# Patient Record
Sex: Female | Born: 1970 | Race: White | Hispanic: No | Marital: Married | Smoking: Former smoker
Health system: Southern US, Community
[De-identification: ages and names within clinical notes are randomized; demographics above are authoritative.]

## PROBLEM LIST (undated history)

## (undated) DIAGNOSIS — K219 Gastro-esophageal reflux disease without esophagitis: Secondary | ICD-10-CM

## (undated) DIAGNOSIS — J45909 Unspecified asthma, uncomplicated: Secondary | ICD-10-CM

## (undated) DIAGNOSIS — Z923 Personal history of irradiation: Secondary | ICD-10-CM

## (undated) DIAGNOSIS — H53001 Unspecified amblyopia, right eye: Secondary | ICD-10-CM

## (undated) DIAGNOSIS — C801 Malignant (primary) neoplasm, unspecified: Secondary | ICD-10-CM

## (undated) DIAGNOSIS — C50919 Malignant neoplasm of unspecified site of unspecified female breast: Secondary | ICD-10-CM

## (undated) DIAGNOSIS — N809 Endometriosis, unspecified: Secondary | ICD-10-CM

## (undated) DIAGNOSIS — D509 Iron deficiency anemia, unspecified: Secondary | ICD-10-CM

## (undated) DIAGNOSIS — G43909 Migraine, unspecified, not intractable, without status migrainosus: Secondary | ICD-10-CM

## (undated) DIAGNOSIS — D649 Anemia, unspecified: Secondary | ICD-10-CM

## (undated) DIAGNOSIS — T884XXA Failed or difficult intubation, initial encounter: Secondary | ICD-10-CM

## (undated) HISTORY — DX: Endometriosis, unspecified: N80.9

## (undated) HISTORY — DX: Malignant (primary) neoplasm, unspecified: C80.1

## (undated) HISTORY — DX: Anemia, unspecified: D64.9

## (undated) HISTORY — PX: ABLATION ON ENDOMETRIOSIS: SHX5787

## (undated) HISTORY — PX: EYE MUSCLE SURGERY: SHX370

## (undated) HISTORY — PX: OTHER SURGICAL HISTORY: SHX169

---
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
## 2005-01-22 ENCOUNTER — Emergency Department: Payer: Self-pay | Admitting: Emergency Medicine

## 2007-09-05 ENCOUNTER — Ambulatory Visit: Payer: Self-pay

## 2007-09-08 ENCOUNTER — Ambulatory Visit: Payer: Self-pay

## 2009-02-15 ENCOUNTER — Ambulatory Visit: Payer: Self-pay | Admitting: Family Medicine

## 2012-11-18 DIAGNOSIS — R102 Pelvic and perineal pain: Secondary | ICD-10-CM | POA: Insufficient documentation

## 2015-06-03 LAB — HM MAMMOGRAPHY: HM MAMMO: NEGATIVE

## 2015-07-01 LAB — HM PAP SMEAR: HM Pap smear: NORMAL

## 2015-09-03 ENCOUNTER — Encounter: Payer: Self-pay | Admitting: Nurse Practitioner

## 2015-09-03 ENCOUNTER — Ambulatory Visit (INDEPENDENT_AMBULATORY_CARE_PROVIDER_SITE_OTHER): Payer: BLUE CROSS/BLUE SHIELD | Admitting: Nurse Practitioner

## 2015-09-03 ENCOUNTER — Encounter (INDEPENDENT_AMBULATORY_CARE_PROVIDER_SITE_OTHER): Payer: Self-pay

## 2015-09-03 VITALS — BP 102/66 | HR 80 | Temp 98.4°F | Resp 14 | Ht 62.0 in | Wt 147.0 lb

## 2015-09-03 DIAGNOSIS — Z23 Encounter for immunization: Secondary | ICD-10-CM | POA: Insufficient documentation

## 2015-09-03 DIAGNOSIS — L299 Pruritus, unspecified: Secondary | ICD-10-CM | POA: Diagnosis not present

## 2015-09-03 DIAGNOSIS — E663 Overweight: Secondary | ICD-10-CM | POA: Diagnosis not present

## 2015-09-03 DIAGNOSIS — Z7689 Persons encountering health services in other specified circumstances: Secondary | ICD-10-CM

## 2015-09-03 DIAGNOSIS — Z7189 Other specified counseling: Secondary | ICD-10-CM

## 2015-09-03 MED ORDER — HYDROXYZINE HCL 10 MG PO TABS: 10.0000 mg | ORAL_TABLET | Freq: Every day | ORAL | Status: DC

## 2015-09-03 NOTE — Patient Instructions (Addendum)
Please follow up in 3 months for a physical exam and lab work (fasting is preferred).   Welcome to [facility]! Nice to meet you!

## 2015-09-03 NOTE — Assessment & Plan Note (Addendum)
Discussed acute and chronic issues. Reviewed health maintenance measures, PFSHx, and immunizations. Obtain records from previous facility.   Patient had flu shot tdap today

## 2015-09-03 NOTE — Progress Notes (Signed)
Pre visit review using our clinic review tool, if applicable. No additional management support is needed unless otherwise documented below in the visit note. 

## 2015-09-03 NOTE — Progress Notes (Signed)
Patient ID: Melinda Holder, female    DOB: 02-19-1971  Age: 44 y.o. MRN: [id number]  CC: Establish Care   HPI Melinda Holder presents for establishing care and CC of medication refill.   1) New pt info:  Immunizations- unknown tdap   Flu- would like   Mammogram- 06/2015 [facility]   Pap- 06/2015 normal per pt  Eye Exam- Not UTD, glasses for reading   Dental Exam- UTD  LMP- August 10th   2) Chronic Problems-  Overweight:   Diet- Eats out during day and cooks at night  Exercise- Walks 3 x a week for 20-30 min.   3) Acute Problems-  Hydroxyzine- Took at night for itching, prescribed by Dermatologist in past. Pt reports trying benadryl, but it was not as helpful. Seeing Dermatologist [facility]) in Oct. Chronic issue.   History Melinda Holder has a past medical history of Endometriosis.   Melinda Holder has past surgical history that includes Ablation on endometriosis and removal of fibroid.   Her family history includes Cancer in her paternal aunt and paternal aunt; Diabetes in her father; Hypertension in her father and mother; Lupus in her mother.Melinda Holder reports that Melinda Holder has never smoked. Melinda Holder has never used smokeless tobacco. Melinda Holder reports that Melinda Holder drinks alcohol. Melinda Holder reports that Melinda Holder does not use illicit drugs.  No outpatient prescriptions prior to visit.   No facility-administered medications prior to visit.    ROS Review of Systems  Constitutional: Negative for fever, chills, diaphoresis and fatigue.  Respiratory: Negative for chest tightness, shortness of breath and wheezing.   Cardiovascular: Negative for chest pain, palpitations and leg swelling.  Gastrointestinal: Negative for nausea, vomiting and diarrhea.  Skin: Negative for rash.  Neurological: Negative for dizziness, weakness, numbness and headaches.  Psychiatric/Behavioral: Negative for suicidal ideas and sleep disturbance. The patient is not nervous/anxious.     Objective:  BP 102/66 mmHg  Pulse 80  Temp(Src) 98.4  F (36.9 C)  Resp 14  Ht 5\' 2"  (1.575 m)  Wt 147 lb (66.679 kg)  BMI 26.88 kg/m2  SpO2 98%  Physical Exam  Constitutional: Melinda Holder is oriented to person, place, and time. Melinda Holder appears well-developed and well-nourished. No distress.  HENT:  Head: Normocephalic and atraumatic.  Right Ear: External ear normal.  Left Ear: External ear normal.  Small white spot on roof of mouth to the right. Seeing ENT for this  Cardiovascular: Normal rate, regular rhythm and normal heart sounds.  Exam reveals no gallop and no friction rub.   No murmur heard. Pulmonary/Chest: Effort normal and breath sounds normal. No respiratory distress. Melinda Holder has no wheezes. Melinda Holder has no rales. Melinda Holder exhibits no tenderness.  Neurological: Melinda Holder is alert and oriented to person, place, and time. No cranial nerve deficit. Melinda Holder exhibits normal muscle tone. Coordination normal.  Skin: Skin is warm and dry. No rash noted. Melinda Holder is not diaphoretic.  Psychiatric: Melinda Holder has a normal mood and affect. Her behavior is normal. Judgment and thought content normal.    Assessment & Plan:   Melinda Holder was seen today for establish care.  Diagnoses and all orders for this visit:  Overweight (BMI 25.0-29.9)  Encounter to establish care  Pruritus  Encounter for immunization  Need for Tdap vaccination -     Tdap vaccine greater than or equal to 7yo IM  Other orders -     hydrOXYzine (ATARAX/VISTARIL) 10 MG tablet; Take 1 tablet (10 mg total) by mouth at bedtime. -     Flu Vaccine QUAD 36+  mos IM   I have changed Ms. Melinda Holder's hydrOXYzine. I am also having her maintain her PROBIOTIC DAILY and Probiotic Product (ALIGN PO).  Meds ordered this encounter  Medications  . DISCONTD: hydrOXYzine (ATARAX/VISTARIL) 10 MG tablet    Sig: Take 10 mg by mouth at bedtime.  . Probiotic Product (PROBIOTIC DAILY) CAPS    Sig: Take by mouth.  . Probiotic Product (ALIGN PO)    Sig: Take by mouth.  . hydrOXYzine (ATARAX/VISTARIL) 10 MG tablet    Sig: Take 1  tablet (10 mg total) by mouth at bedtime.    Dispense:  30 tablet    Refill:  3    Order Specific Question:  Supervising Provider    Answer:  Melinda Holder [[id number]]     Follow-up: Return in about 3 months (around 12/03/2015) for CPE w/ labs.

## 2015-09-03 NOTE — Assessment & Plan Note (Signed)
Stable currently. Refilled hydroxyzine 10 mg tablets to take once at nighttime. Follow-up with dermatologist in October.

## 2015-09-03 NOTE — Assessment & Plan Note (Signed)
Patient reports she eats out during the day and cooks at home at nighttime she is not watching anything in particular at this time. Patient is exercising 3 times a week for 20-30 minutes. Asked her to up to 5 times a week for increased efficacy.

## 2016-01-07 ENCOUNTER — Other Ambulatory Visit: Payer: Self-pay | Admitting: Nurse Practitioner

## 2016-02-27 ENCOUNTER — Encounter: Payer: Self-pay | Admitting: Nurse Practitioner

## 2016-02-27 ENCOUNTER — Ambulatory Visit (INDEPENDENT_AMBULATORY_CARE_PROVIDER_SITE_OTHER): Payer: BLUE CROSS/BLUE SHIELD | Admitting: Nurse Practitioner

## 2016-02-27 VITALS — BP 106/72 | HR 60 | Temp 98.7°F | Resp 16 | Ht 62.0 in | Wt 154.6 lb

## 2016-02-27 DIAGNOSIS — Z0000 Encounter for general adult medical examination without abnormal findings: Secondary | ICD-10-CM | POA: Insufficient documentation

## 2016-02-27 DIAGNOSIS — Z0001 Encounter for general adult medical examination with abnormal findings: Secondary | ICD-10-CM | POA: Insufficient documentation

## 2016-02-27 LAB — COMPREHENSIVE METABOLIC PANEL
ALBUMIN: 4.2 g/dL (ref 3.5–5.2)
ALK PHOS: 39 U/L (ref 39–117)
ALT: 12 U/L (ref 0–35)
AST: 15 U/L (ref 0–37)
BILIRUBIN TOTAL: 0.3 mg/dL (ref 0.2–1.2)
BUN: 10 mg/dL (ref 6–23)
CALCIUM: 9.1 mg/dL (ref 8.4–10.5)
CO2: 29 meq/L (ref 19–32)
Chloride: 103 mEq/L (ref 96–112)
Creatinine, Ser: 0.58 mg/dL (ref 0.40–1.20)
GFR: 120.12 mL/min (ref >60.00)
Glucose, Bld: 82 mg/dL (ref 70–99)
Potassium: 4.9 mEq/L (ref 3.5–5.1)
Sodium: 136 mEq/L (ref 135–145)
Total Protein: 7.2 g/dL (ref 6.0–8.3)

## 2016-02-27 LAB — CBC WITH DIFFERENTIAL/PLATELET
BASOS ABS: 0 10*3/uL (ref 0.0–0.1)
BASOS PCT: 0.5 % (ref 0.0–3.0)
EOS PCT: 3.3 % (ref 0.0–5.0)
Eosinophils Absolute: 0.2 10*3/uL (ref 0.0–0.7)
HEMATOCRIT: 31.8 % — AB (ref 36.0–46.0)
Hemoglobin: 10.6 g/dL — ABNORMAL LOW (ref 12.0–15.0)
LYMPHS ABS: 2.2 10*3/uL (ref 0.7–4.0)
LYMPHS PCT: 34.5 % (ref 12.0–46.0)
MCHC: 33.3 g/dL (ref 30.0–36.0)
MCV: 89 fl (ref 78.0–100.0)
MONOS PCT: 9.1 % (ref 3.0–12.0)
Monocytes Absolute: 0.6 10*3/uL (ref 0.1–1.0)
NEUTROS ABS: 3.3 10*3/uL (ref 1.4–7.7)
NEUTROS PCT: 52.6 % (ref 43.0–77.0)
PLATELETS: 526 10*3/uL — AB (ref 150.0–400.0)
RBC: 3.58 Mil/uL — ABNORMAL LOW (ref 3.87–5.11)
RDW: 15.1 % (ref 11.5–15.5)
WBC: 6.3 10*3/uL (ref 4.0–10.5)

## 2016-02-27 LAB — LIPID PANEL
CHOLESTEROL: 201 mg/dL — AB (ref 0–200)
HDL: 42.5 mg/dL (ref >39.00)
LDL CALC: 123 mg/dL — AB (ref 0–99)
NonHDL: 158.13
TRIGLYCERIDES: 174 mg/dL — AB (ref 0.0–149.0)
Total CHOL/HDL Ratio: 5
VLDL: 34.8 mg/dL (ref 0.0–40.0)

## 2016-02-27 LAB — TSH: TSH: 1.2 u[IU]/mL (ref 0.35–4.50)

## 2016-02-27 LAB — HEMOGLOBIN A1C: HEMOGLOBIN A1C: 5.8 % (ref 4.6–6.5)

## 2016-02-27 MED ORDER — HYDROXYZINE HCL 10 MG PO TABS: ORAL_TABLET | ORAL | Status: DC

## 2016-02-27 NOTE — Patient Instructions (Signed)
Health Maintenance, Female Adopting a healthy lifestyle and getting preventive care can go a long way to promote health and wellness. Talk with your health care provider about what schedule of regular examinations is right for you. This is a good chance for you to check in with your provider about disease prevention and staying healthy. In between checkups, there are plenty of things you can do on your own. Experts have done a lot of research about which lifestyle changes and preventive measures are most likely to keep you healthy. Ask your health care provider for more information. WEIGHT AND DIET  Eat a healthy diet  Be sure to include plenty of vegetables, fruits, low-fat dairy products, and lean protein.  Do not eat a lot of foods high in solid fats, added sugars, or salt.  Get regular exercise. This is one of the most important things you can do for your health.  Most adults should exercise for at least 150 minutes each week. The exercise should increase your heart rate and make you sweat (moderate-intensity exercise).  Most adults should also do strengthening exercises at least twice a week. This is in addition to the moderate-intensity exercise.  Maintain a healthy weight  Body mass index (BMI) is a measurement that can be used to identify possible weight problems. It estimates body fat based on height and weight. Your health care provider can help determine your BMI and help you achieve or maintain a healthy weight.  For females 20 years of age and older:   A BMI below 18.5 is considered underweight.  A BMI of 18.5 to 24.9 is normal.  A BMI of 25 to 29.9 is considered overweight.  A BMI of 30 and above is considered obese.  Watch levels of cholesterol and blood lipids  You should start having your blood tested for lipids and cholesterol at 44 years of age, then have this test every 5 years.  You may need to have your cholesterol levels checked more often if:  Your lipid  or cholesterol levels are high.  You are older than 44 years of age.  You are at high risk for heart disease.  CANCER SCREENING   Lung Cancer  Lung cancer screening is recommended for adults 55-80 years old who are at high risk for lung cancer because of a history of smoking.  A yearly low-dose CT scan of the lungs is recommended for people who:  Currently smoke.  Have quit within the past 15 years.  Have at least a 30-pack-year history of smoking. A pack year is smoking an average of one pack of cigarettes a day for 1 year.  Yearly screening should continue until it has been 15 years since you quit.  Yearly screening should stop if you develop a health problem that would prevent you from having lung cancer treatment.  Breast Cancer  Practice breast self-awareness. This means understanding how your breasts normally appear and feel.  It also means doing regular breast self-exams. Let your health care provider know about any changes, no matter how small.  If you are in your 20s or 30s, you should have a clinical breast exam (CBE) by a health care provider every 1-3 years as part of a regular health exam.  If you are 40 or older, have a CBE every year. Also consider having a breast X-ray (mammogram) every year.  If you have a family history of breast cancer, talk to your health care provider about genetic screening.  If you   are at high risk for breast cancer, talk to your health care provider about having an MRI and a mammogram every year.  Breast cancer gene (BRCA) assessment is recommended for women who have family members with BRCA-related cancers. BRCA-related cancers include:  Breast.  Ovarian.  Tubal.  Peritoneal cancers.  Results of the assessment will determine the need for genetic counseling and BRCA1 and BRCA2 testing. Cervical Cancer Your health care provider may recommend that you be screened regularly for cancer of the pelvic organs (ovaries, uterus, and  vagina). This screening involves a pelvic examination, including checking for microscopic changes to the surface of your cervix (Pap test). You may be encouraged to have this screening done every 3 years, beginning at age 21.  For women ages 30-65, health care providers may recommend pelvic exams and Pap testing every 3 years, or they may recommend the Pap and pelvic exam, combined with testing for human papilloma virus (HPV), every 5 years. Some types of HPV increase your risk of cervical cancer. Testing for HPV may also be done on women of any age with unclear Pap test results.  Other health care providers may not recommend any screening for nonpregnant women who are considered low risk for pelvic cancer and who do not have symptoms. Ask your health care provider if a screening pelvic exam is right for you.  If you have had past treatment for cervical cancer or a condition that could lead to cancer, you need Pap tests and screening for cancer for at least 20 years after your treatment. If Pap tests have been discontinued, your risk factors (such as having a new sexual partner) need to be reassessed to determine if screening should resume. Some women have medical problems that increase the chance of getting cervical cancer. In these cases, your health care provider may recommend more frequent screening and Pap tests. Colorectal Cancer  This type of cancer can be detected and often prevented.  Routine colorectal cancer screening usually begins at 44 years of age and continues through 44 years of age.  Your health care provider may recommend screening at an earlier age if you have risk factors for colon cancer.  Your health care provider may also recommend using home test kits to check for hidden blood in the stool.  A small camera at the end of a tube can be used to examine your colon directly (sigmoidoscopy or colonoscopy). This is done to check for the earliest forms of colorectal  cancer.  Routine screening usually begins at age 50.  Direct examination of the colon should be repeated every 5-10 years through 44 years of age. However, you may need to be screened more often if early forms of precancerous polyps or small growths are found. Skin Cancer  Check your skin from head to toe regularly.  Tell your health care provider about any new moles or changes in moles, especially if there is a change in a mole's shape or color.  Also tell your health care provider if you have a mole that is larger than the size of a pencil eraser.  Always use sunscreen. Apply sunscreen liberally and repeatedly throughout the day.  Protect yourself by wearing long sleeves, pants, a wide-brimmed hat, and sunglasses whenever you are outside. HEART DISEASE, DIABETES, AND HIGH BLOOD PRESSURE   High blood pressure causes heart disease and increases the risk of stroke. High blood pressure is more likely to develop in:  People who have blood pressure in the high end   of the normal range (130-139/85-89 mm Hg).  People who are overweight or obese.  People who are African American.  If you are 38-23 years of age, have your blood pressure checked every 3-5 years. If you are 61 years of age or older, have your blood pressure checked every year. You should have your blood pressure measured twice--once when you are at a hospital or clinic, and once when you are not at a hospital or clinic. Record the average of the two measurements. To check your blood pressure when you are not at a hospital or clinic, you can use:  An automated blood pressure machine at a pharmacy.  A home blood pressure monitor.  If you are between 45 years and 39 years old, ask your health care provider if you should take aspirin to prevent strokes.  Have regular diabetes screenings. This involves taking a blood sample to check your fasting blood sugar level.  If you are at a normal weight and have a low risk for diabetes,  have this test once every three years after 44 years of age.  If you are overweight and have a high risk for diabetes, consider being tested at a younger age or more often. PREVENTING INFECTION  Hepatitis B  If you have a higher risk for hepatitis B, you should be screened for this virus. You are considered at high risk for hepatitis B if:  You were born in a country where hepatitis B is common. Ask your health care provider which countries are considered high risk.  Your parents were born in a high-risk country, and you have not been immunized against hepatitis B (hepatitis B vaccine).  You have HIV or AIDS.  You use needles to inject street drugs.  You live with someone who has hepatitis B.  You have had sex with someone who has hepatitis B.  You get hemodialysis treatment.  You take certain medicines for conditions, including cancer, organ transplantation, and autoimmune conditions. Hepatitis C  Blood testing is recommended for:  Everyone born from [birth year] through 1964.  Anyone with known risk factors for hepatitis C. Sexually transmitted infections (STIs)  You should be screened for sexually transmitted infections (STIs) including gonorrhea and chlamydia if:  You are sexually active and are younger than 44 years of age.  You are older than 44 years of age and your health care provider tells you that you are at risk for this type of infection.  Your sexual activity has changed since you were last screened and you are at an increased risk for chlamydia or gonorrhea. Ask your health care provider if you are at risk.  If you do not have HIV, but are at risk, it may be recommended that you take a prescription medicine daily to prevent HIV infection. This is called pre-exposure prophylaxis (PrEP). You are considered at risk if:  You are sexually active and do not regularly use condoms or know the HIV status of your partner(s).  You take drugs by injection.  You are sexually  active with a partner who has HIV. Talk with your health care provider about whether you are at high risk of being infected with HIV. If you choose to begin PrEP, you should first be tested for HIV. You should then be tested every 3 months for as long as you are taking PrEP.  PREGNANCY   If you are premenopausal and you may become pregnant, ask your health care provider about preconception counseling.  If you may  become pregnant, take 400 to 800 micrograms (mcg) of folic acid every day.  If you want to prevent pregnancy, talk to your health care provider about birth control (contraception). OSTEOPOROSIS AND MENOPAUSE   Osteoporosis is a disease in which the bones lose minerals and strength with aging. This can result in serious bone fractures. Your risk for osteoporosis can be identified using a bone density scan.  If you are 61 years of age or older, or if you are at risk for osteoporosis and fractures, ask your health care provider if you should be screened.  Ask your health care provider whether you should take a calcium or vitamin D supplement to lower your risk for osteoporosis.  Menopause may have certain physical symptoms and risks.  Hormone replacement therapy may reduce some of these symptoms and risks. Talk to your health care provider about whether hormone replacement therapy is right for you.  HOME CARE INSTRUCTIONS   Schedule regular health, dental, and eye exams.  Stay current with your immunizations.   Do not use any tobacco products including cigarettes, chewing tobacco, or electronic cigarettes.  If you are pregnant, do not drink alcohol.  If you are breastfeeding, limit how much and how often you drink alcohol.  Limit alcohol intake to no more than 1 drink per day for nonpregnant women. One drink equals 12 ounces of beer, 5 ounces of wine, or 1 ounces of hard liquor.  Do not use street drugs.  Do not share needles.  Ask your health care provider for help if  you need support or information about quitting drugs.  Tell your health care provider if you often feel depressed.  Tell your health care provider if you have ever been abused or do not feel safe at home.   This information is not intended to replace advice given to you by your health care provider. Make sure you discuss any questions you have with your health care provider.   Document Released: 06/29/2011 Document Revised: 01/04/2015 Document Reviewed: 11/15/2013 Elsevier Interactive Patient Education [employer].

## 2016-02-27 NOTE — Assessment & Plan Note (Signed)
Discussed acute and chronic issues. Reviewed health maintenance measures, PFSHx, and immunizations. Obtain routine labs TSH, Lipid panel, CBC w/ diff, A1c, and CMET.   Health Maintenance is UTD

## 2016-02-27 NOTE — Progress Notes (Signed)
Patient ID: Melinda Holder, female    DOB: December 16, 1971  Age: 45 y.o. MRN: [id number]  CC: Annual Exam   HPI Melinda Holder presents for Annual physical exam.   1) Annual Physical   Diet- No changes   Exercise- Walking 3 x a week 20-30 minutes  Immunizations-    Flu- UTD   Tdap- UTD  Mammogram-06/2015 at [facility] Exam- Not UTD  Dental Exam- UTD  LMP-  02/22/16 lasted 6 days   PAP- last year at [place].  Refills: Needs  History Melinda Holder has a past medical history of Endometriosis.   She has past surgical history that includes Ablation on endometriosis and removal of fibroid.   Her family history includes Cancer in her paternal aunt and paternal aunt; Diabetes in her father; Hypertension in her father and mother; Lupus in her mother.She reports that she has never smoked. She has never used smokeless tobacco. She reports that she drinks alcohol. She reports that she does not use illicit drugs.  Outpatient Prescriptions Prior to Visit  Medication Sig Dispense Refill  . Probiotic Product (ALIGN PO) Take by mouth.    . Probiotic Product (PROBIOTIC DAILY) CAPS Take by mouth.    . hydrOXYzine (ATARAX/VISTARIL) 10 MG tablet TAKE 1 TABLET (10 MG TOTAL) BY MOUTH AT BEDTIME. 30 tablet 3   No facility-administered medications prior to visit.    ROS Review of Systems  Constitutional: Negative for fever, chills, diaphoresis, fatigue and unexpected weight change.  HENT: Negative for tinnitus and trouble swallowing.   Eyes: Negative for visual disturbance.  Respiratory: Negative for chest tightness, shortness of breath and wheezing.   Cardiovascular: Negative for chest pain, palpitations and leg swelling.  Gastrointestinal: Negative for nausea, vomiting, abdominal pain, diarrhea, constipation and blood in stool.  Endocrine: Positive for polydipsia and polyuria. Negative for polyphagia.  Genitourinary: Negative for dysuria, hematuria, vaginal  discharge and vaginal pain.  Musculoskeletal: Negative for myalgias, back pain, arthralgias and gait problem.  Skin: Negative for color change and rash.  Neurological: Negative for dizziness, weakness, numbness and headaches.  Hematological: Does not bruise/bleed easily.  Psychiatric/Behavioral: Negative for suicidal ideas and sleep disturbance. The patient is not nervous/anxious.     Objective:  BP 106/72 mmHg  Pulse 60  Temp(Src) 98.7 F (37.1 C) (Oral)  Resp 16  Ht 5\' 2"  (1.575 m)  Wt 154 lb 9.6 oz (70.126 kg)  BMI 28.27 kg/m2  SpO2 98%  LMP 02/22/2016  Physical Exam  Constitutional: She is oriented to person, place, and time. She appears well-developed and well-nourished. No distress.  HENT:  Head: Normocephalic and atraumatic.  Right Ear: External ear normal.  Left Ear: External ear normal.  Nose: Nose normal.  Mouth/Throat: Oropharynx is clear and moist. No oropharyngeal exudate.  TMs and canals clear bilaterally  Eyes: Conjunctivae and EOM are normal. Pupils are equal, round, and reactive to light. Right eye exhibits no discharge. Left eye exhibits no discharge. No scleral icterus.  Neck: Normal range of motion. Neck supple. No thyromegaly present.  Cardiovascular: Normal rate, regular rhythm, normal heart sounds and intact distal pulses.  Exam reveals no gallop and no friction rub.   No murmur heard. Pulmonary/Chest: Effort normal and breath sounds normal. No respiratory distress. She has no wheezes. She has no rales. She exhibits no tenderness.  Breast exam performed without significant findings  Abdominal: Soft. Bowel sounds are normal. She exhibits no distension and no mass. There is  no tenderness. There is no rebound and no guarding.  Genitourinary:  Deferred to [place]  Musculoskeletal: Normal range of motion. She exhibits no edema or tenderness.  Lymphadenopathy:    She has no cervical adenopathy.  Neurological: She is alert and oriented to person, place, and  time. She has normal reflexes. No cranial nerve deficit. She exhibits normal muscle tone. Coordination normal.  Skin: Skin is warm and dry. No rash noted. She is not diaphoretic. No erythema. No pallor.  Psychiatric: She has a normal mood and affect. Her behavior is normal. Judgment and thought content normal.   Assessment & Plan:   Melinda Holder was seen today for annual exam.  Diagnoses and all orders for this visit:  Routine general medical examination at a health care facility -     CBC with Differential/Platelet -     Comprehensive metabolic panel -     Hemoglobin A1c -     Lipid panel -     TSH  Other orders -     hydrOXYzine (ATARAX/VISTARIL) 10 MG tablet; TAKE 1 TABLET (10 MG TOTAL) BY MOUTH AT BEDTIME.   I am having Melinda Holder maintain her PROBIOTIC DAILY, Probiotic Product (ALIGN PO), and hydrOXYzine.  Meds ordered this encounter  Medications  . hydrOXYzine (ATARAX/VISTARIL) 10 MG tablet    Sig: TAKE 1 TABLET (10 MG TOTAL) BY MOUTH AT BEDTIME.    Dispense:  30 tablet    Refill:  3    Order Specific Question:  Supervising Provider    Answer:  Crecencio Mc [[id number]]     Follow-up: Return in about 1 year (around 02/26/2017) for CPE w/ labs.

## 2016-03-04 ENCOUNTER — Other Ambulatory Visit: Payer: Self-pay | Admitting: Nurse Practitioner

## 2016-03-04 ENCOUNTER — Telehealth: Payer: Self-pay

## 2016-03-04 DIAGNOSIS — R7989 Other specified abnormal findings of blood chemistry: Secondary | ICD-10-CM

## 2016-03-04 NOTE — Telephone Encounter (Signed)
Message regarding her labs sent to you. Please call pt and discuss. Send me a message back when complete. Thanks!

## 2016-03-04 NOTE — Telephone Encounter (Signed)
Pt is requesting test results, available on mychart but I don't see a message. Please advise, thanks

## 2016-03-17 ENCOUNTER — Other Ambulatory Visit (INDEPENDENT_AMBULATORY_CARE_PROVIDER_SITE_OTHER): Payer: BLUE CROSS/BLUE SHIELD

## 2016-03-17 DIAGNOSIS — D473 Essential (hemorrhagic) thrombocythemia: Secondary | ICD-10-CM | POA: Diagnosis not present

## 2016-03-17 DIAGNOSIS — R7989 Other specified abnormal findings of blood chemistry: Secondary | ICD-10-CM

## 2016-03-17 LAB — CBC WITH DIFFERENTIAL/PLATELET
BASOS ABS: 0 10*3/uL (ref 0.0–0.1)
BASOS PCT: 0.7 % (ref 0.0–3.0)
Eosinophils Absolute: 0.2 10*3/uL (ref 0.0–0.7)
Eosinophils Relative: 2.6 % (ref 0.0–5.0)
HEMATOCRIT: 32.6 % — AB (ref 36.0–46.0)
Hemoglobin: 10.8 g/dL — ABNORMAL LOW (ref 12.0–15.0)
LYMPHS ABS: 1.8 10*3/uL (ref 0.7–4.0)
LYMPHS PCT: 29.9 % (ref 12.0–46.0)
MCHC: 33 g/dL (ref 30.0–36.0)
MCV: 87.1 fl (ref 78.0–100.0)
MONOS PCT: 10 % (ref 3.0–12.0)
Monocytes Absolute: 0.6 10*3/uL (ref 0.1–1.0)
NEUTROS ABS: 3.4 10*3/uL (ref 1.4–7.7)
NEUTROS PCT: 56.8 % (ref 43.0–77.0)
PLATELETS: 529 10*3/uL — AB (ref 150.0–400.0)
RBC: 3.74 Mil/uL — ABNORMAL LOW (ref 3.87–5.11)
RDW: 14.6 % (ref 11.5–15.5)
WBC: 5.9 10*3/uL (ref 4.0–10.5)

## 2016-03-23 ENCOUNTER — Other Ambulatory Visit: Payer: Self-pay | Admitting: Nurse Practitioner

## 2016-03-23 ENCOUNTER — Telehealth: Payer: Self-pay

## 2016-03-23 DIAGNOSIS — D649 Anemia, unspecified: Secondary | ICD-10-CM

## 2016-03-23 DIAGNOSIS — R5382 Chronic fatigue, unspecified: Secondary | ICD-10-CM

## 2016-03-23 NOTE — Telephone Encounter (Signed)
Pt states that since starting the iron supplement she has been consipated. Pt states that she has not moved her bowels since Thursday afternoon. Pt feels nausea and bloated. I asked the pt if she is eating anything before taking iron supplements and she states that she eats a meal before taking iron. Pt has not tried anything OTC for constipation. Pt  Says she feels tried and has lack of energy. Please advise, thanks

## 2016-03-23 NOTE — Telephone Encounter (Signed)
Great, order is in. Thanks for doing that!

## 2016-03-23 NOTE — Telephone Encounter (Signed)
Pt states that wants to move forward with the blood draw. Pt has been scheduled for the lab Thursday at 11am Please advise, thanks

## 2016-03-23 NOTE — Telephone Encounter (Signed)
The tired and lack of energy can be from anemia. She can add metamucil daily over the counter to help her move her bowels or I can call in some miralax to help her.

## 2016-03-23 NOTE — Telephone Encounter (Signed)
Pt wants to know if she can get B12 injections for the anemia and she stated that she will buy the OTC Miralax. Please advise, thanks

## 2016-03-23 NOTE — Telephone Encounter (Signed)
That (B12) is a different test that would be another blood draw.

## 2016-03-26 ENCOUNTER — Other Ambulatory Visit (INDEPENDENT_AMBULATORY_CARE_PROVIDER_SITE_OTHER): Payer: BLUE CROSS/BLUE SHIELD

## 2016-03-26 DIAGNOSIS — R5382 Chronic fatigue, unspecified: Secondary | ICD-10-CM

## 2016-03-26 LAB — VITAMIN B12: VITAMIN B 12: 598 pg/mL (ref 211–911)

## 2016-09-20 ENCOUNTER — Other Ambulatory Visit: Payer: Self-pay | Admitting: Nurse Practitioner

## 2016-10-27 ENCOUNTER — Encounter: Payer: Self-pay | Admitting: Emergency Medicine

## 2016-10-27 ENCOUNTER — Emergency Department: Payer: BLUE CROSS/BLUE SHIELD

## 2016-10-27 ENCOUNTER — Emergency Department
Admission: EM | Admit: 2016-10-27 | Discharge: 2016-10-27 | Disposition: A | Discharge status: Home / Self Care | Payer: BLUE CROSS/BLUE SHIELD | Attending: Emergency Medicine | Admitting: Emergency Medicine

## 2016-10-27 DIAGNOSIS — Y999 Unspecified external cause status: Secondary | ICD-10-CM | POA: Insufficient documentation

## 2016-10-27 DIAGNOSIS — S42002A Fracture of unspecified part of left clavicle, initial encounter for closed fracture: Secondary | ICD-10-CM | POA: Diagnosis not present

## 2016-10-27 DIAGNOSIS — Y92410 Unspecified street and highway as the place of occurrence of the external cause: Secondary | ICD-10-CM | POA: Insufficient documentation

## 2016-10-27 DIAGNOSIS — S42022A Displaced fracture of shaft of left clavicle, initial encounter for closed fracture: Secondary | ICD-10-CM | POA: Diagnosis not present

## 2016-10-27 DIAGNOSIS — Y9389 Activity, other specified: Secondary | ICD-10-CM | POA: Diagnosis not present

## 2016-10-27 DIAGNOSIS — S4992XA Unspecified injury of left shoulder and upper arm, initial encounter: Secondary | ICD-10-CM | POA: Diagnosis present

## 2016-10-27 DIAGNOSIS — S299XXA Unspecified injury of thorax, initial encounter: Secondary | ICD-10-CM | POA: Diagnosis not present

## 2016-10-27 MED ORDER — ACETAMINOPHEN 500 MG PO TABS
1000.0000 mg | ORAL_TABLET | Freq: Once | ORAL | Status: AC
  Administered 2016-10-27: 1000 mg via ORAL
  Filled 2016-10-27: qty 2

## 2016-10-27 MED ORDER — OXYCODONE-ACETAMINOPHEN 5-325 MG PO TABS: 1.0000 | ORAL_TABLET | ORAL | 0 refills | Status: DC | PRN

## 2016-10-27 MED ORDER — OXYCODONE HCL 5 MG PO TABS
5.0000 mg | ORAL_TABLET | Freq: Once | ORAL | Status: AC
  Administered 2016-10-27: 5 mg via ORAL
  Filled 2016-10-27: qty 1
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
## 2016-10-27 NOTE — ED Notes (Signed)
Patient transported to X-ray 
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
## 2016-10-28 HISTORY — PX: CLAVICLE SURGERY: SHX598

## 2016-10-30 DIAGNOSIS — S42022A Displaced fracture of shaft of left clavicle, initial encounter for closed fracture: Secondary | ICD-10-CM | POA: Diagnosis not present

## 2016-11-03 ENCOUNTER — Encounter
Admission: RE | Disposition: A | Discharge status: Home / Self Care | Payer: Self-pay | Source: Ambulatory Visit | Attending: Surgery

## 2016-11-03 ENCOUNTER — Ambulatory Visit: Payer: BLUE CROSS/BLUE SHIELD | Admitting: Anesthesiology

## 2016-11-03 ENCOUNTER — Encounter: Payer: Self-pay | Admitting: *Deleted

## 2016-11-03 ENCOUNTER — Inpatient Hospital Stay: Admission: RE | Admit: 2016-11-03 | Payer: Self-pay | Source: Ambulatory Visit

## 2016-11-03 ENCOUNTER — Ambulatory Visit
Admission: RE | Admit: 2016-11-03 | Discharge: 2016-11-03 | Disposition: A | Discharge status: Home / Self Care | Payer: BLUE CROSS/BLUE SHIELD | Source: Ambulatory Visit | Attending: Surgery | Admitting: Surgery

## 2016-11-03 ENCOUNTER — Ambulatory Visit: Payer: BLUE CROSS/BLUE SHIELD

## 2016-11-03 DIAGNOSIS — Y9389 Activity, other specified: Secondary | ICD-10-CM | POA: Insufficient documentation

## 2016-11-03 DIAGNOSIS — S42002A Fracture of unspecified part of left clavicle, initial encounter for closed fracture: Secondary | ICD-10-CM | POA: Diagnosis not present

## 2016-11-03 DIAGNOSIS — Y998 Other external cause status: Secondary | ICD-10-CM | POA: Diagnosis not present

## 2016-11-03 DIAGNOSIS — S42022A Displaced fracture of shaft of left clavicle, initial encounter for closed fracture: Secondary | ICD-10-CM | POA: Diagnosis not present

## 2016-11-03 DIAGNOSIS — Y9289 Other specified places as the place of occurrence of the external cause: Secondary | ICD-10-CM | POA: Insufficient documentation

## 2016-11-03 DIAGNOSIS — F17210 Nicotine dependence, cigarettes, uncomplicated: Secondary | ICD-10-CM | POA: Insufficient documentation

## 2016-11-03 DIAGNOSIS — T884XXA Failed or difficult intubation, initial encounter: Secondary | ICD-10-CM

## 2016-11-03 DIAGNOSIS — S42102A Fracture of unspecified part of scapula, left shoulder, initial encounter for closed fracture: Secondary | ICD-10-CM | POA: Diagnosis not present

## 2016-11-03 DIAGNOSIS — Z419 Encounter for procedure for purposes other than remedying health state, unspecified: Secondary | ICD-10-CM

## 2016-11-03 HISTORY — DX: Failed or difficult intubation, initial encounter: T88.4XXA

## 2016-11-03 HISTORY — PX: ORIF CLAVICULAR FRACTURE: SHX5055

## 2016-11-03 HISTORY — DX: Unspecified amblyopia, right eye: H53.001

## 2016-11-03 LAB — POCT PREGNANCY, URINE: Preg Test, Ur: NEGATIVE

## 2016-11-03 SURGERY — OPEN REDUCTION INTERNAL FIXATION (ORIF) CLAVICULAR FRACTURE
Anesthesia: General | Site: Shoulder | Laterality: Left | Wound class: Clean

## 2016-11-03 MED ORDER — BUPIVACAINE-EPINEPHRINE (PF) 0.25% -1:200000 IJ SOLN
INTRAMUSCULAR | Status: AC
  Filled 2016-11-03: qty 30

## 2016-11-03 MED ORDER — CEFAZOLIN SODIUM-DEXTROSE 2-4 GM/100ML-% IV SOLN: 2.0000 g | Freq: Once | INTRAVENOUS | Status: DC

## 2016-11-03 MED ORDER — CEFAZOLIN SODIUM-DEXTROSE 2-4 GM/100ML-% IV SOLN
INTRAVENOUS | Status: AC
  Filled 2016-11-03: qty 100

## 2016-11-03 MED ORDER — RACEPINEPHRINE HCL 2.25 % IN NEBU
INHALATION_SOLUTION | RESPIRATORY_TRACT | Status: AC
  Administered 2016-11-03: 0.5 mL via RESPIRATORY_TRACT
  Filled 2016-11-03: qty 0.5

## 2016-11-03 MED ORDER — METHYLPREDNISOLONE SODIUM SUCC 125 MG IJ SOLR
INTRAMUSCULAR | Status: DC | PRN
  Administered 2016-11-03: 125 mg via INTRAVENOUS

## 2016-11-03 MED ORDER — DEXAMETHASONE SODIUM PHOSPHATE 10 MG/ML IJ SOLN
INTRAMUSCULAR | Status: DC | PRN
  Administered 2016-11-03: 10 mg via INTRAVENOUS

## 2016-11-03 MED ORDER — EPHEDRINE SULFATE 50 MG/ML IJ SOLN
INTRAMUSCULAR | Status: DC | PRN
  Administered 2016-11-03 (×3): 10 mg via INTRAVENOUS

## 2016-11-03 MED ORDER — RACEPINEPHRINE HCL 2.25 % IN NEBU
0.5000 mL | INHALATION_SOLUTION | Freq: Once | RESPIRATORY_TRACT | Status: AC
  Administered 2016-11-03: 0.5 mL via RESPIRATORY_TRACT

## 2016-11-03 MED ORDER — NEOMYCIN-POLYMYXIN B GU 40-200000 IR SOLN
Status: DC | PRN
  Administered 2016-11-03: 2 mL

## 2016-11-03 MED ORDER — LACTATED RINGERS IV SOLN
INTRAVENOUS | Status: DC
  Administered 2016-11-03 (×2): via INTRAVENOUS

## 2016-11-03 MED ORDER — FENTANYL CITRATE (PF) 100 MCG/2ML IJ SOLN
25.0000 ug | INTRAMUSCULAR | Status: DC | PRN
  Administered 2016-11-03 (×4): 25 ug via INTRAVENOUS

## 2016-11-03 MED ORDER — FENTANYL CITRATE (PF) 100 MCG/2ML IJ SOLN
INTRAMUSCULAR | Status: DC | PRN
  Administered 2016-11-03: 50 ug via INTRAVENOUS
  Administered 2016-11-03: 100 ug via INTRAVENOUS
  Administered 2016-11-03: 50 ug via INTRAVENOUS
  Administered 2016-11-03: 25 ug via INTRAVENOUS
  Administered 2016-11-03: 50 ug via INTRAVENOUS
  Administered 2016-11-03: 25 ug via INTRAVENOUS

## 2016-11-03 MED ORDER — BUPIVACAINE-EPINEPHRINE (PF) 0.5% -1:200000 IJ SOLN
INTRAMUSCULAR | Status: DC | PRN
  Administered 2016-11-03: 30 mL via PERINEURAL

## 2016-11-03 MED ORDER — ONDANSETRON HCL 4 MG/2ML IJ SOLN: 4.0000 mg | Freq: Once | INTRAMUSCULAR | Status: DC | PRN

## 2016-11-03 MED ORDER — SUCCINYLCHOLINE CHLORIDE 20 MG/ML IJ SOLN
INTRAMUSCULAR | Status: DC | PRN
  Administered 2016-11-03 (×2): 100 mg via INTRAVENOUS

## 2016-11-03 MED ORDER — OXYCODONE HCL 5 MG PO TABS: 5.0000 mg | ORAL_TABLET | ORAL | 0 refills | Status: DC | PRN

## 2016-11-03 MED ORDER — ONDANSETRON HCL 4 MG/2ML IJ SOLN
INTRAMUSCULAR | Status: DC | PRN
  Administered 2016-11-03: 4 mg via INTRAVENOUS

## 2016-11-03 MED ORDER — KETOROLAC TROMETHAMINE 30 MG/ML IJ SOLN
INTRAMUSCULAR | Status: DC | PRN
  Administered 2016-11-03: 30 mg via INTRAVENOUS

## 2016-11-03 MED ORDER — PROPOFOL 10 MG/ML IV BOLUS
INTRAVENOUS | Status: DC | PRN
  Administered 2016-11-03: 150 mg via INTRAVENOUS
  Administered 2016-11-03 (×2): 50 mg via INTRAVENOUS

## 2016-11-03 MED ORDER — LIDOCAINE HCL (CARDIAC) 20 MG/ML IV SOLN
INTRAVENOUS | Status: DC | PRN
  Administered 2016-11-03: 60 mg via INTRAVENOUS

## 2016-11-03 MED ORDER — ROCURONIUM BROMIDE 100 MG/10ML IV SOLN
INTRAVENOUS | Status: DC | PRN
  Administered 2016-11-03: 10 mg via INTRAVENOUS
  Administered 2016-11-03: 5 mg via INTRAVENOUS

## 2016-11-03 MED ORDER — SUGAMMADEX SODIUM 200 MG/2ML IV SOLN
INTRAVENOUS | Status: DC | PRN
  Administered 2016-11-03: 140 mg via INTRAVENOUS

## 2016-11-03 MED ORDER — NEOMYCIN-POLYMYXIN B GU 40-200000 IR SOLN
Status: AC
Start: 2016-11-03 — End: 2016-11-03
  Filled 2016-11-03: qty 2

## 2016-11-03 MED ORDER — PHENYLEPHRINE HCL 10 MG/ML IJ SOLN
INTRAMUSCULAR | Status: DC | PRN
  Administered 2016-11-03 (×2): 50 ug via INTRAVENOUS
  Administered 2016-11-03: 100 ug via INTRAVENOUS

## 2016-11-03 MED ORDER — MIDAZOLAM HCL 5 MG/5ML IJ SOLN
INTRAMUSCULAR | Status: DC | PRN
  Administered 2016-11-03: 2 mg via INTRAVENOUS

## 2016-11-03 MED ORDER — FENTANYL CITRATE (PF) 100 MCG/2ML IJ SOLN
INTRAMUSCULAR | Status: AC
  Administered 2016-11-03: 25 ug via INTRAVENOUS
  Filled 2016-11-03: qty 2

## 2016-11-03 MED ORDER — FAMOTIDINE 20 MG PO TABS
20.0000 mg | ORAL_TABLET | Freq: Once | ORAL | Status: AC
  Administered 2016-11-03: 20 mg via ORAL

## 2016-11-03 MED ORDER — FAMOTIDINE 20 MG PO TABS
ORAL_TABLET | ORAL | Status: AC
  Filled 2016-11-03: qty 1

## 2016-11-03 SURGICAL SUPPLY — 43 items
BINDER ABDOMINAL 12 ML 46-62 (SOFTGOODS) IMPLANT
BIT DRILL 2.8 QUICK RELEASE (BIT) ×1 IMPLANT
BNDG COHESIVE 4X5 TAN STRL (GAUZE/BANDAGES/DRESSINGS) ×3 IMPLANT
CANISTER SUCT 1200ML W/VALVE (MISCELLANEOUS) ×3 IMPLANT
CHLORAPREP W/TINT 26ML (MISCELLANEOUS) ×3 IMPLANT
CLOSURE WOUND 1/2 X4 (GAUZE/BANDAGES/DRESSINGS)
DRAPE C-ARM XRAY 36X54 (DRAPES) ×3 IMPLANT
DRAPE IMP U-DRAPE 54X76 (DRAPES) ×6 IMPLANT
DRAPE INCISE IOBAN 66X45 STRL (DRAPES) ×6 IMPLANT
DRILL 2.8 QUICK RELEASE (BIT) ×3
DRSG OPSITE POSTOP 4X6 (GAUZE/BANDAGES/DRESSINGS) IMPLANT
GAUZE SPONGE 4X4 12PLY STRL (GAUZE/BANDAGES/DRESSINGS) ×3 IMPLANT
GLOVE BIO SURGEON STRL SZ7.5 (GLOVE) ×6 IMPLANT
GLOVE BIO SURGEON STRL SZ8 (GLOVE) ×6 IMPLANT
GLOVE BIOGEL PI IND STRL 8 (GLOVE) ×2 IMPLANT
GLOVE BIOGEL PI INDICATOR 8 (GLOVE) ×4
GLOVE INDICATOR 8.0 STRL GRN (GLOVE) ×3 IMPLANT
GLOVE SURG XRAY 8.0 LX (GLOVE) ×3 IMPLANT
GOWN STRL REUS W/ TWL LRG LVL3 (GOWN DISPOSABLE) ×1 IMPLANT
GOWN STRL REUS W/ TWL XL LVL3 (GOWN DISPOSABLE) ×1 IMPLANT
GOWN STRL REUS W/TWL LRG LVL3 (GOWN DISPOSABLE) ×2
GOWN STRL REUS W/TWL XL LVL3 (GOWN DISPOSABLE) ×2
IMMOBILIZER SHDR LG LX 900803 (SOFTGOODS) IMPLANT
IMMOBILIZER SHDR MD LX WHT (SOFTGOODS) ×3 IMPLANT
KIT RM TURNOVER STRD PROC AR (KITS) ×3 IMPLANT
KIT STABILIZATION SHOULDER (MISCELLANEOUS) ×3 IMPLANT
MASK FACE SPIDER DISP (MASK) IMPLANT
NEEDLE FILTER BLUNT 18X 1/2SAF (NEEDLE) ×2
NEEDLE FILTER BLUNT 18X1 1/2 (NEEDLE) ×1 IMPLANT
NS IRRIG 500ML POUR BTL (IV SOLUTION) ×3 IMPLANT
PACK ARTHROSCOPY SHOULDER (MISCELLANEOUS) ×3 IMPLANT
PLATE CLAV LOCK 6H SML (Plate) ×3 IMPLANT
SCREW CANCEL 4.0X12 (Screw) ×3 IMPLANT
SCREW NON LOCK 3.5X10MM (Screw) ×9 IMPLANT
SCREW NON LOCK 3.5X8MM (Screw) ×3 IMPLANT
SCREW NONLOCK HEX 3.5X12 (Screw) ×6 IMPLANT
STAPLER SKIN PROX 35W (STAPLE) ×3 IMPLANT
STRIP CLOSURE SKIN 1/2X4 (GAUZE/BANDAGES/DRESSINGS) IMPLANT
SUT VIC AB 0 SH 27 (SUTURE) ×3 IMPLANT
SUT VIC AB 2-0 CT1 27 (SUTURE) ×4
SUT VIC AB 2-0 CT1 TAPERPNT 27 (SUTURE) ×2 IMPLANT
SUT VIC AB 2-0 CT2 27 (SUTURE) ×6 IMPLANT
SYRINGE 10CC LL (SYRINGE) ×3 IMPLANT

## 2016-11-03 NOTE — Discharge Instructions (Addendum)
May shower with intact OpSite dressing.  Apply ice frequently to shoulder. Take ibuprofen 800 mg TID with meals for 7-10 days, then as necessary. Take oxycodone as prescribed when needed.  May supplement with ES Tylenol if necessary. Keep shoulder immobilizer on at all times except may remove for bathing purposes. Follow-up in 10-14 days or as scheduled.   AMBULATORY SURGERY  DISCHARGE INSTRUCTIONS   1) The drugs that you were given will stay in your system until tomorrow so for the next 24 hours you should not:  A) Drive an automobile B) Make any legal decisions C) Drink any alcoholic beverage   2) You may resume regular meals tomorrow.  Today it is better to start with liquids and gradually work up to solid foods.  You may eat anything you prefer, but it is better to start with liquids, then soup and crackers, and gradually work up to solid foods.   3) Please notify your doctor immediately if you have any unusual bleeding, trouble breathing, redness and pain at the surgery site, drainage, fever, or pain not relieved by medication.    4) Additional Instructions:        Please contact your physician with any problems or Same Day Surgery at [phone], Monday through Friday 6 am to 4 pm, or [place] at [facility] number at [phone].

## 2016-11-03 NOTE — H&P (Signed)
Paper H&P to be scanned into permanent record. H&P reviewed. No changes. 

## 2016-11-03 NOTE — Anesthesia Procedure Notes (Signed)
Procedure Name: Intubation Date/Time: 11/03/2016 3:48 PM Performed by: Dionne Bucy Pre-anesthesia Checklist: Patient identified, Patient being monitored, Timeout performed, Emergency Drugs available and Suction available Patient Re-evaluated:Patient Re-evaluated prior to inductionOxygen Delivery Method: Circle system utilized Preoxygenation: Pre-oxygenation with 100% oxygen Intubation Type: IV induction Ventilation: Mask ventilation without difficulty Laryngoscope Size: Glidescope and 3 (Lo pro S3) Grade View: Grade III Tube type: Oral Tube size: 7.0 mm Number of attempts: 4 (see note below) Airway Equipment and Method: Stylet Placement Confirmation: ETT inserted through vocal cords under direct vision,  positive ETCO2 and breath sounds checked- equal and bilateral Secured at: 21 cm Tube secured with: Tape Dental Injury: Teeth and Oropharynx as per pre-operative assessment  Difficulty Due To: Difficulty was anticipated Future Recommendations: Recommend- induction with short-acting agent, and alternative techniques readily available Comments: DL x1 with MAC 3 per CRNA with grade II-III view; unable to pass ETT due to pt being anterior; DL X 2 with McGrath #3 per CRNA; unable to pass ETT due to anterior Grade II-III view; DL x3 with Glidescope #3 Lo pro per Dr Andree Elk with same view as above; unable to pass ETT utilizing Glidescope stylet due to pt being anterior; DL x4 with Glidescope #3 Lo pro; bougie utilized and pt was successfully intubated with ETT 7 with + ETCO2 and Bilat equal breath sounds.  Pt VS stable throughout and easy to mask ventilate.

## 2016-11-03 NOTE — Transfer of Care (Signed)
Immediate Anesthesia Transfer of Care Note  Patient: Melinda Holder  Procedure(s) Performed: Procedure(s): OPEN REDUCTION INTERNAL FIXATION (ORIF) CLAVICULAR FRACTURE (Left)  Patient Location: PACU  Anesthesia Type:General  Level of Consciousness: awake, alert  and oriented  Airway & Oxygen Therapy: Patient Spontanous Breathing and Patient connected to face mask oxygen  Post-op Assessment: Report given to RN and Post -op Vital signs reviewed and stable  Post vital signs: Reviewed and stable  Last Vitals:  Vitals:   11/03/16 1355 11/03/16 1744  BP: 112/71 (!) 115/58  Pulse: 84 85  Resp: 16 14  Temp: 36.8 C 36.4 C    Last Pain:  Vitals:   11/03/16 1744  TempSrc:   PainSc: 8          Complications: No apparent anesthesia complications

## 2016-11-03 NOTE — Anesthesia Preprocedure Evaluation (Signed)
Anesthesia Evaluation  Patient identified by MRN, date of birth, ID band Patient awake    Reviewed: Allergy & Precautions, NPO status , Patient's Chart, lab work & pertinent test results  History of Anesthesia Complications Negative for: history of anesthetic complications  Airway Mallampati: III       Dental   Pulmonary Current Smoker,           Cardiovascular negative cardio ROS       Neuro/Psych    GI/Hepatic negative GI ROS, Neg liver ROS,   Endo/Other  negative endocrine ROS  Renal/GU negative Renal ROS     Musculoskeletal negative musculoskeletal ROS (+)   Abdominal   Peds  Hematology negative hematology ROS (+)   Anesthesia Other Findings   Reproductive/Obstetrics                             Anesthesia Physical Anesthesia Plan  ASA: II  Anesthesia Plan: General   Post-op Pain Management:    Induction: Intravenous  Airway Management Planned: Oral ETT  Additional Equipment:   Intra-op Plan:   Post-operative Plan:   Informed Consent: I have reviewed the patients History and Physical, chart, labs and discussed the procedure including the risks, benefits and alternatives for the proposed anesthesia with the patient or authorized representative who has indicated his/her understanding and acceptance.     Plan Discussed with:   Anesthesia Plan Comments:         Anesthesia Quick Evaluation
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
## 2016-11-04 ENCOUNTER — Encounter: Payer: Self-pay | Admitting: Surgery

## 2016-11-06 NOTE — Anesthesia Postprocedure Evaluation (Signed)
Anesthesia Post Note  Patient: Melinda Holder  Procedure(s) Performed: Procedure(s) (LRB): OPEN REDUCTION INTERNAL FIXATION (ORIF) CLAVICULAR FRACTURE (Left)  Patient location during evaluation: PACU Anesthesia Type: General Level of consciousness: awake and alert Pain management: pain level controlled Vital Signs Assessment: post-procedure vital signs reviewed and stable Respiratory status: spontaneous breathing, nonlabored ventilation, respiratory function stable and patient connected to nasal cannula oxygen Cardiovascular status: blood pressure returned to baseline and stable Postop Assessment: no signs of nausea or vomiting Anesthetic complications: no    Last Vitals:  Vitals:   11/03/16 1824 11/03/16 1830  BP: 118/63 123/76  Pulse: 91 80  Resp: 19 18  Temp: 36.9 C 36.6 C    Last Pain:  Vitals:   11/04/16 0826  TempSrc:   PainSc: 0-No pain                 Molli Barrows

## 2016-11-13 DIAGNOSIS — S42022D Displaced fracture of shaft of left clavicle, subsequent encounter for fracture with routine healing: Secondary | ICD-10-CM | POA: Diagnosis not present

## 2016-11-13 DIAGNOSIS — Z8781 Personal history of (healed) traumatic fracture: Secondary | ICD-10-CM | POA: Diagnosis not present

## 2016-11-13 DIAGNOSIS — Z967 Presence of other bone and tendon implants: Secondary | ICD-10-CM | POA: Diagnosis not present

## 2016-12-18 DIAGNOSIS — S42022D Displaced fracture of shaft of left clavicle, subsequent encounter for fracture with routine healing: Secondary | ICD-10-CM | POA: Diagnosis not present

## 2017-02-24 ENCOUNTER — Ambulatory Visit (INDEPENDENT_AMBULATORY_CARE_PROVIDER_SITE_OTHER): Payer: BLUE CROSS/BLUE SHIELD | Admitting: Advanced Practice Midwife

## 2017-02-24 ENCOUNTER — Encounter: Payer: Self-pay | Admitting: Advanced Practice Midwife

## 2017-02-24 VITALS — BP 106/58 | HR 88 | Ht 66.5 in | Wt 146.0 lb

## 2017-02-24 DIAGNOSIS — R350 Frequency of micturition: Secondary | ICD-10-CM

## 2017-02-24 DIAGNOSIS — Z0000 Encounter for general adult medical examination without abnormal findings: Secondary | ICD-10-CM

## 2017-02-24 DIAGNOSIS — N938 Other specified abnormal uterine and vaginal bleeding: Secondary | ICD-10-CM | POA: Diagnosis not present

## 2017-02-24 LAB — POCT URINALYSIS DIPSTICK
Bilirubin, UA: NEGATIVE
Blood, UA: NEGATIVE
Glucose, UA: NEGATIVE
Ketones, UA: NEGATIVE
LEUKOCYTES UA: NEGATIVE
NITRITE UA: NEGATIVE
PH UA: 7
PROTEIN UA: NEGATIVE
Spec Grav, UA: 1.01
Urobilinogen, UA: NEGATIVE

## 2017-02-24 MED ORDER — NORETHINDRONE 0.35 MG PO TABS: 1.0000 | ORAL_TABLET | Freq: Every day | ORAL | 11 refills | Status: DC

## 2017-02-24 NOTE — Patient Instructions (Signed)
Health Maintenance, Female Adopting a healthy lifestyle and getting preventive care can go a long way to promote health and wellness. Talk with your health care provider about what schedule of regular examinations is right for you. This is a good chance for you to check in with your provider about disease prevention and staying healthy. In between checkups, there are plenty of things you can do on your own. Experts have done a lot of research about which lifestyle changes and preventive measures are most likely to keep you healthy. Ask your health care provider for more information. Weight and diet Eat a healthy diet  Be sure to include plenty of vegetables, fruits, low-fat dairy products, and lean protein.  Do not eat a lot of foods high in solid fats, added sugars, or salt.  Get regular exercise. This is one of the most important things you can do for your health.  Most adults should exercise for at least 150 minutes each week. The exercise should increase your heart rate and make you sweat (moderate-intensity exercise).  Most adults should also do strengthening exercises at least twice a week. This is in addition to the moderate-intensity exercise. Maintain a healthy weight  Body mass index (BMI) is a measurement that can be used to identify possible weight problems. It estimates body fat based on height and weight. Your health care provider can help determine your BMI and help you achieve or maintain a healthy weight.  For females 76 years of age and older:  A BMI below 18.5 is considered underweight.  A BMI of 18.5 to 24.9 is normal.  A BMI of 25 to 29.9 is considered overweight.  A BMI of 30 and above is considered obese. Watch levels of cholesterol and blood lipids  You should start having your blood tested for lipids and cholesterol at 45 years of age, then have this test every 5 years.  You may need to have your cholesterol levels checked more often if:  Your lipid or  cholesterol levels are high.  You are older than 45 years of age.  You are at high risk for heart disease. Cancer screening Lung Cancer  Lung cancer screening is recommended for adults 64-42 years old who are at high risk for lung cancer because of a history of smoking.  A yearly low-dose CT scan of the lungs is recommended for people who:  Currently smoke.  Have quit within the past 15 years.  Have at least a 30-pack-year history of smoking. A pack year is smoking an average of one pack of cigarettes a day for 1 year.  Yearly screening should continue until it has been 15 years since you quit.  Yearly screening should stop if you develop a health problem that would prevent you from having lung cancer treatment. Breast Cancer  Practice breast self-awareness. This means understanding how your breasts normally appear and feel.  It also means doing regular breast self-exams. Let your health care provider know about any changes, no matter how small.  If you are in your 20s or 30s, you should have a clinical breast exam (CBE) by a health care provider every 1-3 years as part of a regular health exam.  If you are 34 or older, have a CBE every year. Also consider having a breast X-ray (mammogram) every year.  If you have a family history of breast cancer, talk to your health care provider about genetic screening.  If you are at high risk for breast cancer, talk  to your health care provider about having an MRI and a mammogram every year.  Breast cancer gene (BRCA) assessment is recommended for women who have family members with BRCA-related cancers. BRCA-related cancers include:  Breast.  Ovarian.  Tubal.  Peritoneal cancers.  Results of the assessment will determine the need for genetic counseling and BRCA1 and BRCA2 testing. Cervical Cancer  Your health care provider may recommend that you be screened regularly for cancer of the pelvic organs (ovaries, uterus, and vagina).  This screening involves a pelvic examination, including checking for microscopic changes to the surface of your cervix (Pap test). You may be encouraged to have this screening done every 3 years, beginning at age 24.  For women ages 66-65, health care providers may recommend pelvic exams and Pap testing every 3 years, or they may recommend the Pap and pelvic exam, combined with testing for human papilloma virus (HPV), every 5 years. Some types of HPV increase your risk of cervical cancer. Testing for HPV may also be done on women of any age with unclear Pap test results.  Other health care providers may not recommend any screening for nonpregnant women who are considered low risk for pelvic cancer and who do not have symptoms. Ask your health care provider if a screening pelvic exam is right for you.  If you have had past treatment for cervical cancer or a condition that could lead to cancer, you need Pap tests and screening for cancer for at least 20 years after your treatment. If Pap tests have been discontinued, your risk factors (such as having a new sexual partner) need to be reassessed to determine if screening should resume. Some women have medical problems that increase the chance of getting cervical cancer. In these cases, your health care provider may recommend more frequent screening and Pap tests. Colorectal Cancer  This type of cancer can be detected and often prevented.  Routine colorectal cancer screening usually begins at 45 years of age and continues through 45 years of age.  Your health care provider may recommend screening at an earlier age if you have risk factors for colon cancer.  Your health care provider may also recommend using home test kits to check for hidden blood in the stool.  A small camera at the end of a tube can be used to examine your colon directly (sigmoidoscopy or colonoscopy). This is done to check for the earliest forms of colorectal cancer.  Routine  screening usually begins at age 41.  Direct examination of the colon should be repeated every 5-10 years through 45 years of age. However, you may need to be screened more often if early forms of precancerous polyps or small growths are found. Skin Cancer  Check your skin from head to toe regularly.  Tell your health care provider about any new moles or changes in moles, especially if there is a change in a mole's shape or color.  Also tell your health care provider if you have a mole that is larger than the size of a pencil eraser.  Always use sunscreen. Apply sunscreen liberally and repeatedly throughout the day.  Protect yourself by wearing long sleeves, pants, a wide-brimmed hat, and sunglasses whenever you are outside. Heart disease, diabetes, and high blood pressure  High blood pressure causes heart disease and increases the risk of stroke. High blood pressure is more likely to develop in:  People who have blood pressure in the high end of the normal range (130-139/85-89 mm Hg).  People who are overweight or obese.  People who are African American.  If you are 59-24 years of age, have your blood pressure checked every 3-5 years. If you are 34 years of age or older, have your blood pressure checked every year. You should have your blood pressure measured twice-once when you are at a hospital or clinic, and once when you are not at a hospital or clinic. Record the average of the two measurements. To check your blood pressure when you are not at a hospital or clinic, you can use:  An automated blood pressure machine at a pharmacy.  A home blood pressure monitor.  If you are between 29 years and 60 years old, ask your health care provider if you should take aspirin to prevent strokes.  Have regular diabetes screenings. This involves taking a blood sample to check your fasting blood sugar level.  If you are at a normal weight and have a low risk for diabetes, have this test once  every three years after 45 years of age.  If you are overweight and have a high risk for diabetes, consider being tested at a younger age or more often. Preventing infection Hepatitis B  If you have a higher risk for hepatitis B, you should be screened for this virus. You are considered at high risk for hepatitis B if:  You were born in a country where hepatitis B is common. Ask your health care provider which countries are considered high risk.  Your parents were born in a high-risk country, and you have not been immunized against hepatitis B (hepatitis B vaccine).  You have HIV or AIDS.  You use needles to inject street drugs.  You live with someone who has hepatitis B.  You have had sex with someone who has hepatitis B.  You get hemodialysis treatment.  You take certain medicines for conditions, including cancer, organ transplantation, and autoimmune conditions. Hepatitis C  Blood testing is recommended for:  Everyone born from [birth year] through 1964.  Anyone with known risk factors for hepatitis C. Sexually transmitted infections (STIs)  You should be screened for sexually transmitted infections (STIs) including gonorrhea and chlamydia if:  You are sexually active and are younger than 45 years of age.  You are older than 45 years of age and your health care provider tells you that you are at risk for this type of infection.  Your sexual activity has changed since you were last screened and you are at an increased risk for chlamydia or gonorrhea. Ask your health care provider if you are at risk.  If you do not have HIV, but are at risk, it may be recommended that you take a prescription medicine daily to prevent HIV infection. This is called pre-exposure prophylaxis (PrEP). You are considered at risk if:  You are sexually active and do not regularly use condoms or know the HIV status of your partner(s).  You take drugs by injection.  You are sexually active with a partner  who has HIV. Talk with your health care provider about whether you are at high risk of being infected with HIV. If you choose to begin PrEP, you should first be tested for HIV. You should then be tested every 3 months for as long as you are taking PrEP. Pregnancy  If you are premenopausal and you may become pregnant, ask your health care provider about preconception counseling.  If you may become pregnant, take 400 to 800 micrograms (mcg) of folic acid  every day.  If you want to prevent pregnancy, talk to your health care provider about birth control (contraception). Osteoporosis and menopause  Osteoporosis is a disease in which the bones lose minerals and strength with aging. This can result in serious bone fractures. Your risk for osteoporosis can be identified using a bone density scan.  If you are 4 years of age or older, or if you are at risk for osteoporosis and fractures, ask your health care provider if you should be screened.  Ask your health care provider whether you should take a calcium or vitamin D supplement to lower your risk for osteoporosis.  Menopause may have certain physical symptoms and risks.  Hormone replacement therapy may reduce some of these symptoms and risks. Talk to your health care provider about whether hormone replacement therapy is right for you. Follow these instructions at home:  Schedule regular health, dental, and eye exams.  Stay current with your immunizations.  Do not use any tobacco products including cigarettes, chewing tobacco, or electronic cigarettes.  If you are pregnant, do not drink alcohol.  If you are breastfeeding, limit how much and how often you drink alcohol.  Limit alcohol intake to no more than 1 drink per day for nonpregnant women. One drink equals 12 ounces of beer, 5 ounces of wine, or 1 ounces of hard liquor.  Do not use street drugs.  Do not share needles.  Ask your health care provider for help if you need support  or information about quitting drugs.  Tell your health care provider if you often feel depressed.  Tell your health care provider if you have ever been abused or do not feel safe at home. This information is not intended to replace advice given to you by your health care provider. Make sure you discuss any questions you have with your health care provider. Document Released: 06/29/2011 Document Revised: 05/21/2016 Document Reviewed: 09/17/2015 Elsevier Interactive Patient Education  2017 [employer].

## 2017-02-24 NOTE — Progress Notes (Signed)
Gynecology Annual Exam  PCP: No PCP Per Patient  Chief Complaint:  Chief Complaint  Patient presents with  . Gynecologic Exam  . Urinary Frequency    History of Present Illness: Patient is a 46 y.o. G0P0000 presents for annual exam. The patient has c/o irregular and heavy periods x 2 years. She has frequent urination x 1 month.  LMP: Patient's last menstrual period was 02/07/2017 (exact date).  Shortest Interval: 28 Longest Interval: 28  days Duration of flow: 7 days Heavy Menses: yes every other month Clots: yes Intermenstrual Bleeding: no Postcoital Bleeding: no Dysmenorrhea: yes   The patient is sexually active. She currently uses None for contraception. The patient wears seatbelts: yes.   last pap: was normal and last mammogram: was normal  The patient has regular exercise: yes.  The patient has ever been transfused or tattooed?: not asked.  The patient reports that domestic violence in her life is absent.    Review of Systems: ROS  Past Medical History:  Past Medical History:  Diagnosis Date  . Amblyopia of eye, right   . Endometriosis     Past Surgical History:  Past Surgical History:  Procedure Laterality Date  . ABLATION ON ENDOMETRIOSIS    . CLAVICLE SURGERY Left 10/2016  . EYE MUSCLE SURGERY Right    child  . ORIF CLAVICULAR FRACTURE Left 11/03/2016   Procedure: OPEN REDUCTION INTERNAL FIXATION (ORIF) CLAVICULAR FRACTURE;  Surgeon: Corky Mull, MD;  Location: [facility] ORS;  Service: Orthopedics;  Laterality: Left;  . removal of fibroid      Gynecologic History:  Patient's last menstrual period was 02/07/2017 (exact date). Contraception: none Last Pap: 2016 Results were: no abnormalities  Last mammogram: 2016 Results were: Normal  Obstetric History: G0P0000  Family History:  Family History  Problem Relation Age of Onset  . Lupus Mother   . Hypertension Mother   . Hypertension Father   . Diabetes Father   . Cancer Paternal Aunt     Breast  Cancer  . Cancer Paternal Aunt     Lung Cancer    Social History:  Social History   Social History  . Marital status: Married    Spouse name: N/A  . Number of children: N/A  . Years of education: N/A   Occupational History  . Not on file.   Social History Main Topics  . Smoking status: Former Smoker    Types: Cigarettes  . Smokeless tobacco: Never Used  . Alcohol use 0.0 oz/week     Comment: Occasional   . Drug use: No  . Sexual activity: Yes    Partners: Male     Comment: 1 female   Other Topics Concern  . 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 Authorizing Provider  hydrOXYzine (ATARAX/VISTARIL) 10 MG tablet TAKE 1 TABLET (10 MG TOTAL) BY MOUTH AT BEDTIME. 09/22/16  Yes Leone Haven, MD  ibuprofen (ADVIL,MOTRIN) 200 MG tablet Take 400 mg by mouth every 6 (six) hours as needed for headache or moderate pain.    Historical Provider, MD  oxyCODONE (ROXICODONE) 5 MG immediate release tablet Take 1-2 tablets (  5-10 mg total) by mouth every 4 (four) hours as needed for severe pain. Patient not taking: Reported on 02/24/2017 11/03/16   Corky Mull, MD    Physical Exam Vitals: Blood pressure (!) 106/58, pulse 88, height 5' 6.5" (1.689 m), weight 146 lb (66.2 kg), last menstrual period 02/07/2017.  General: NAD HEENT: normocephalic, anicteric Thyroid: no enlargement, no palpable nodules Pulmonary: No increased work of breathing, CTAB Cardiovascular: RRR, distal pulses 2+ Breast: Breast symmetrical, no tenderness, no palpable nodules or masses, no skin or nipple retraction present, no nipple discharge.  No axillary or supraclavicular lymphadenopathy. Abdomen: NABS, soft, non-tender, non-distended.   Umbilicus without lesions.  No hepatomegaly, splenomegaly or masses palpable. No evidence of hernia  Genitourinary:  External: Normal external female genitalia.  Normal urethral meatus, normal  Bartholin's and Skene's glands.    Vagina: Normal vaginal mucosa, no evidence of prolapse.    Cervix: Grossly normal in appearance, no bleeding  Uterus: Non-enlarged, mobile, normal contour.  No CMT  Adnexa: ovaries non-enlarged, no adnexal masses  Rectal: deferred  Lymphatic: no evidence of inguinal lymphadenopathy Extremities: no edema, erythema, or tenderness Neurologic: Grossly intact Psychiatric: mood appropriate, affect full   @MAMMOFINDINGS @ @MAMMONEXTRECDUEDATE @     Assessment: 46 y.o. G0P0000 Well Woman Gyn exam with history of uterine fibroids and c/o menorrhagia x 2years and frequent urination x 1 month   Plan: Problem List Items Addressed This Visit    None    Visit Diagnoses    Urinary frequency    -  Primary   Relevant Orders   POCT Urinalysis Dipstick (Completed)      1) Mammogram - recommend yearly screening mammogram  2) ASCCP guidelines and rational discussed.  Patient opts for annual screening interval  3) Pelvic U/S for heavy bleeding/history of fibroids  4) Urine Culture  5) Hormone treatment recommended for heavy periods - Education given regarding options for contraception, including IUD, Nexplanon, oral contraceptives. Pt chooses oral progesterone only pill  6) F/U with MD after U/S to discuss options if fibroids are found  7) Recommend increase healthy lifestyle diet   Rod Can, CNM

## 2017-02-25 LAB — PAP IG (IMAGE GUIDED): PAP SMEAR COMMENT: 0

## 2017-02-26 LAB — URINE CULTURE: Organism ID, Bacteria: NO GROWTH

## 2017-03-04 ENCOUNTER — Encounter: Payer: Self-pay | Admitting: Family Medicine

## 2017-03-04 ENCOUNTER — Encounter: Payer: Self-pay | Admitting: Nurse Practitioner

## 2017-03-16 ENCOUNTER — Telehealth: Payer: Self-pay | Admitting: Advanced Practice Midwife

## 2017-03-16 ENCOUNTER — Other Ambulatory Visit: Payer: Self-pay | Admitting: Advanced Practice Midwife

## 2017-03-16 DIAGNOSIS — Z1231 Encounter for screening mammogram for malignant neoplasm of breast: Secondary | ICD-10-CM

## 2017-03-16 NOTE — Telephone Encounter (Signed)
Pt is  caliing stating she had her annual with Opal Sidles 02/24/17 and needs a possible referral from [place] for her mammogram. Would you please look into this.

## 2017-03-17 NOTE — Telephone Encounter (Signed)
Pt does not need referral for screening. There are no breast problems.

## 2017-03-17 NOTE — Telephone Encounter (Signed)
Patient is aware and is already scheduled for 04/23/17.

## 2017-03-31 ENCOUNTER — Other Ambulatory Visit: Payer: Self-pay | Admitting: Family Medicine

## 2017-03-31 NOTE — Telephone Encounter (Signed)
Patient has not established care with you, appt scheduled for 05/20/17 last filled 09/22/16 30 [id number]

## 2017-03-31 NOTE — Telephone Encounter (Signed)
Has not been seen in the office in over a year. She needs an appointment for this to be refilled. Thanks.

## 2017-04-01 NOTE — Telephone Encounter (Signed)
Patient notified

## 2017-04-23 ENCOUNTER — Ambulatory Visit: Payer: BLUE CROSS/BLUE SHIELD | Admitting: Advanced Practice Midwife

## 2017-04-23 ENCOUNTER — Ambulatory Visit
Admission: RE | Admit: 2017-04-23 | Discharge: 2017-04-23 | Disposition: A | Discharge status: Home / Self Care | Payer: BLUE CROSS/BLUE SHIELD | Source: Ambulatory Visit | Attending: Advanced Practice Midwife | Admitting: Advanced Practice Midwife

## 2017-04-23 ENCOUNTER — Other Ambulatory Visit: Payer: BLUE CROSS/BLUE SHIELD

## 2017-04-23 DIAGNOSIS — Z1231 Encounter for screening mammogram for malignant neoplasm of breast: Secondary | ICD-10-CM | POA: Diagnosis not present

## 2017-04-26 DIAGNOSIS — L299 Pruritus, unspecified: Secondary | ICD-10-CM | POA: Diagnosis not present

## 2017-05-20 ENCOUNTER — Ambulatory Visit (INDEPENDENT_AMBULATORY_CARE_PROVIDER_SITE_OTHER): Payer: BLUE CROSS/BLUE SHIELD | Admitting: Family Medicine

## 2017-05-20 ENCOUNTER — Encounter: Payer: Self-pay | Admitting: Family Medicine

## 2017-05-20 VITALS — BP 108/80 | HR 87 | Temp 98.4°F | Ht 62.5 in | Wt 152.2 lb

## 2017-05-20 DIAGNOSIS — Z13220 Encounter for screening for lipoid disorders: Secondary | ICD-10-CM | POA: Diagnosis not present

## 2017-05-20 DIAGNOSIS — Z1329 Encounter for screening for other suspected endocrine disorder: Secondary | ICD-10-CM

## 2017-05-20 DIAGNOSIS — K5900 Constipation, unspecified: Secondary | ICD-10-CM | POA: Insufficient documentation

## 2017-05-20 DIAGNOSIS — L299 Pruritus, unspecified: Secondary | ICD-10-CM

## 2017-05-20 DIAGNOSIS — M25471 Effusion, right ankle: Secondary | ICD-10-CM | POA: Insufficient documentation

## 2017-05-20 DIAGNOSIS — Z0001 Encounter for general adult medical examination with abnormal findings: Secondary | ICD-10-CM

## 2017-05-20 DIAGNOSIS — E663 Overweight: Secondary | ICD-10-CM | POA: Diagnosis not present

## 2017-05-20 DIAGNOSIS — D509 Iron deficiency anemia, unspecified: Secondary | ICD-10-CM | POA: Diagnosis not present

## 2017-05-20 MED ORDER — HYDROXYZINE HCL 10 MG PO TABS: ORAL_TABLET | ORAL | 5 refills | Status: DC

## 2017-05-20 NOTE — Assessment & Plan Note (Signed)
Chronic stable issue. Refill Atarax.

## 2017-05-20 NOTE — Assessment & Plan Note (Signed)
Unsure of the exact cause currently. There is no swelling proximal to the ankle. No injury. No pain. Potentially could be arthritic in nature. Could be related to her anemia. We'll check lab work as outlined below. She'll ice and elevate and if not improving let us know.

## 2017-05-20 NOTE — Assessment & Plan Note (Signed)
Well-controlled with MiraLAX. Encouraged to continue this and monitor her diet.

## 2017-05-20 NOTE — Assessment & Plan Note (Signed)
Encouraged diet and exercise.  

## 2017-05-20 NOTE — Patient Instructions (Signed)
Nice to see you. We will have you return for fasting lab work. Please work on [employer] as we discussed. Please continue to exercise. You can try icing your right ankle and foot to see if that would be beneficial. Please continue MiraLAX for constipation. If your constipation worsens or you develop abdominal pain please let us know.

## 2017-05-20 NOTE — Assessment & Plan Note (Signed)
Physical exam completed. Patient is overweight. Discussed diet and exercise. Blood pressure well controlled. Pelvic exam and breast exam deferred given completed by gynecology. We'll check labs as outlined below.

## 2017-05-20 NOTE — Progress Notes (Signed)
Melinda Rumps, MD Phone: [phone]  Melinda Holder is a 46 y.o. female who presents today for physical exam.  Patient exercises by walking daily. Diet consists of one green daily. Does eat a lot of protein. 2-3 times a week she has burgers and french fries. Does drink 3 glasses of sweet tea daily. Pap smear up-to-date. Mammogram up-to-date. Breast exam up-to-date. All done through gynecology. No family history of colon cancer. Tetanus vaccination up-to-date. HIV testing up-to-date. Patient is a former smoker. She quit 1-1/2 years ago. Smoked 1 pack per day for 24 years. Averages about one beer per day. No illicit drug use. Has a menstrual cycle once monthly lasting for 7 days. Is heavy at times. Her gynecologist recommended she go on a progesterone only birth control to help mitigate this. They also recommended ultrasound. Patient has not started the birth control and does not want to do the ultrasound. She saw gynecology 2-3 months ago.   Patient notes over the last several months she has had swelling in her right ankle and foot. No pain. No injury. Previously was in both feet. Goes down with propping her legs up. No calf swelling. No calf pain.  Constipation: Takes MiraLAX daily. If she doesn't take it her stools are hard little balls. Notes her constipation depends on what she eats. Occasionally has a discomfort with it. No issues currently.  She has chronic itching. She takes Atarax for this. Saw dermatologist previously and they found no specific cause. They felt maybe it would be related to anxiety though she reports no anxiety.  Active Ambulatory Problems    Diagnosis Date Noted  . Pruritus 09/03/2015  . Overweight (BMI 25.0-29.9) 09/03/2015  . Encounter for general adult medical examination with abnormal findings 02/27/2016  . Adnexal pain 11/18/2012  . Ankle swelling, right 05/20/2017  . Iron deficiency anemia 05/20/2017  . Constipation 05/20/2017   Resolved  Ambulatory Problems    Diagnosis Date Noted  . Encounter to establish care 09/03/2015  . Need for prophylactic vaccination with combined diphtheria-tetanus-pertussis (DTP) vaccine 09/03/2015  . Encounter for immunization 09/03/2015  . Need for Tdap vaccination 09/03/2015   Past Medical History:  Diagnosis Date  . Amblyopia of eye, right   . Endometriosis     Family History  Problem Relation Age of Onset  . Lupus Mother   . Hypertension Mother   . Hypertension Father   . Diabetes Father   . Cancer Paternal Aunt        Breast Cancer  . Cancer Paternal Aunt        Lung Cancer  . Breast cancer Neg Hx     Social History   Social History  . Marital status: Married    Spouse name: N/A  . Number of children: N/A  . Years of education: N/A   Occupational History  . Not on file.   Social History Main Topics  . Smoking status: Former Smoker    Types: Cigarettes  . Smokeless tobacco: Never Used  . Alcohol use 0.0 oz/week     Comment: Occasional   . Drug use: No  . Sexual activity: Yes    Partners: Male     Comment: 1 female   Other Topics Concern  . 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 CV:  Positive for edema, Negative for chest pain, dyspnea, palpitations Resp: Negative for cough, dyspnea, hemoptysis GI: Positive for constipation, Negative for nausea, vomiting, diarrhea, abdominal pain, melena, hematochezia. GU: Negative for dysuria, incontinence, urinary hesitance, hematuria, vaginal or penile discharge, polyuria, sexual difficulty, lumps in testicle or breasts MSK:  Negative for muscle cramps or aches, positive for joint pain or swelling Neuro: Negative for headaches, weakness, numbness, dizziness, passing out/fainting Psych: Negative for depression, anxiety, memory problems  Objective  Physical Exam Vitals:   05/20/17 0909  BP: 108/80  Pulse: 87  Temp: 98.4 F (36.9 C)    BP Readings from Last 3 Encounters:  05/20/17 108/80  02/24/17 (!) 106/58  06/20/15 120/76   Wt Readings from Last 3 Encounters:  05/20/17 152 lb 3.2 oz (69 kg)  02/24/17 146 lb (66.2 kg)  06/20/15 148 lb (67.1 kg)    Physical Exam  Constitutional: No distress.  HENT:  Head: Normocephalic and atraumatic.  Mouth/Throat: Oropharynx is clear and moist. No oropharyngeal exudate.  Eyes: Conjunctivae are normal. Pupils are equal, round, and reactive to light.  Cardiovascular: Normal rate, regular rhythm and normal heart sounds.   Pulmonary/Chest: Effort normal and breath sounds normal.  Abdominal: Soft. Bowel sounds are normal. She exhibits no distension. There is no tenderness. There is no rebound and no guarding.  Musculoskeletal:  Minimal swelling of the right ankle and right dorsum of the foot, 2+ DP pulse, left ankle and foot normal with 2+ DP pulse, no calf swelling or tenderness bilaterally  Neurological: She is alert. Gait normal.  Skin: Skin is warm and dry. She is not diaphoretic.  Psychiatric: Mood and affect normal.     Assessment/Plan:   Encounter for general adult medical examination with abnormal findings Physical exam completed. Patient is overweight. Discussed diet and exercise. Blood pressure well controlled. Pelvic exam and breast exam deferred given completed by gynecology. We'll check labs as outlined below.  Overweight (BMI 25.0-29.9) Encouraged diet and exercise.  Pruritus Chronic stable issue. Refill Atarax.  Iron deficiency anemia Reports history of anemia. Suspect related to heavy menstrual cycles. We will recheck labs as outlined  below.  Ankle swelling, right Unsure of the exact cause currently. There is no swelling proximal to the ankle. No injury. No pain. Potentially could be arthritic in nature. Could be related to her anemia. We'll check lab work as outlined below. She'll ice and elevate and if not improving let us know.  Constipation Well-controlled with MiraLAX. Encouraged to continue this and monitor her diet.   Orders Placed This Encounter  Procedures  . CBC    Standing Status:   Future    Standing Expiration Date:   05/20/2018  . Iron and TIBC    Standing Status:   Future    Standing Expiration Date:   05/20/2018  . Ferritin    Standing Status:   Future    Standing Expiration Date:   05/20/2018  . TSH    Standing Status:   Future    Standing Expiration Date:   05/20/2018  . Comp Met (CMET)    Standing Status:   Future    Standing Expiration Date:   05/20/2018  . Lipid panel    Standing Status:  Future    Standing Expiration Date:   05/20/2018  . HgB A1c    Standing Status:   Future    Standing Expiration Date:   05/20/2018    Meds ordered this encounter  Medications  . hydrOXYzine (ATARAX/VISTARIL) 10 MG tablet    Sig: TAKE 1 TABLET (10 MG TOTAL) BY MOUTH AT BEDTIME.    Dispense:  30 tablet    Refill:  [place], MD [place]

## 2017-05-20 NOTE — Assessment & Plan Note (Signed)
Reports history of anemia. Suspect related to heavy menstrual cycles. We will recheck labs as outlined below.

## 2017-06-04 ENCOUNTER — Other Ambulatory Visit (INDEPENDENT_AMBULATORY_CARE_PROVIDER_SITE_OTHER): Payer: BLUE CROSS/BLUE SHIELD

## 2017-06-04 DIAGNOSIS — Z13220 Encounter for screening for lipoid disorders: Secondary | ICD-10-CM | POA: Diagnosis not present

## 2017-06-04 DIAGNOSIS — Z1329 Encounter for screening for other suspected endocrine disorder: Secondary | ICD-10-CM

## 2017-06-04 DIAGNOSIS — D509 Iron deficiency anemia, unspecified: Secondary | ICD-10-CM | POA: Diagnosis not present

## 2017-06-04 DIAGNOSIS — E663 Overweight: Secondary | ICD-10-CM

## 2017-06-04 LAB — IRON AND TIBC
%SAT: 5 % — ABNORMAL LOW (ref 11–50)
IRON: 23 ug/dL — AB (ref 40–190)
TIBC: 422 ug/dL (ref 250–450)
UIBC: 399 ug/dL

## 2017-06-04 LAB — CBC
HCT: 33.1 % — ABNORMAL LOW (ref 36.0–46.0)
Hemoglobin: 10.7 g/dL — ABNORMAL LOW (ref 12.0–15.0)
MCHC: 32.3 g/dL (ref 30.0–36.0)
MCV: 80.8 fl (ref 78.0–100.0)
Platelets: 594 10*3/uL — ABNORMAL HIGH (ref 150.0–400.0)
RBC: 4.09 Mil/uL (ref 3.87–5.11)
RDW: 16.7 % — AB (ref 11.5–15.5)
WBC: 6.1 10*3/uL (ref 4.0–10.5)

## 2017-06-04 LAB — LIPID PANEL
CHOLESTEROL: 191 mg/dL (ref 0–200)
HDL: 37.4 mg/dL — ABNORMAL LOW (ref >39.00)
LDL Cholesterol: 131 mg/dL — ABNORMAL HIGH (ref 0–99)
NonHDL: 153.95
TRIGLYCERIDES: 116 mg/dL (ref 0.0–149.0)
Total CHOL/HDL Ratio: 5
VLDL: 23.2 mg/dL (ref 0.0–40.0)

## 2017-06-04 LAB — TSH: TSH: 1.14 u[IU]/mL (ref 0.35–4.50)

## 2017-06-04 LAB — COMPREHENSIVE METABOLIC PANEL
ALBUMIN: 4.2 g/dL (ref 3.5–5.2)
ALK PHOS: 40 U/L (ref 39–117)
ALT: 14 U/L (ref 0–35)
AST: 15 U/L (ref 0–37)
BUN: 15 mg/dL (ref 6–23)
CALCIUM: 9.4 mg/dL (ref 8.4–10.5)
CO2: 26 mEq/L (ref 19–32)
Chloride: 104 mEq/L (ref 96–112)
Creatinine, Ser: 0.63 mg/dL (ref 0.40–1.20)
GFR: 108.56 mL/min (ref >60.00)
Glucose, Bld: 90 mg/dL (ref 70–99)
Potassium: 4.1 mEq/L (ref 3.5–5.1)
Sodium: 136 mEq/L (ref 135–145)
TOTAL PROTEIN: 7.4 g/dL (ref 6.0–8.3)
Total Bilirubin: 0.3 mg/dL (ref 0.2–1.2)

## 2017-06-04 LAB — FERRITIN: Ferritin: 4.9 ng/mL — ABNORMAL LOW (ref 10.0–291.0)

## 2017-06-04 LAB — HEMOGLOBIN A1C: HEMOGLOBIN A1C: 6 % (ref 4.6–6.5)

## 2017-06-07 ENCOUNTER — Other Ambulatory Visit: Payer: Self-pay | Admitting: Family Medicine

## 2017-06-07 DIAGNOSIS — D75839 Thrombocytosis, unspecified: Secondary | ICD-10-CM

## 2017-06-07 DIAGNOSIS — D509 Iron deficiency anemia, unspecified: Secondary | ICD-10-CM

## 2017-06-07 DIAGNOSIS — D473 Essential (hemorrhagic) thrombocythemia: Secondary | ICD-10-CM

## 2017-06-21 ENCOUNTER — Inpatient Hospital Stay: Payer: BLUE CROSS/BLUE SHIELD

## 2017-06-21 ENCOUNTER — Inpatient Hospital Stay: Payer: BLUE CROSS/BLUE SHIELD | Attending: Oncology | Admitting: Oncology

## 2017-06-21 ENCOUNTER — Encounter: Payer: Self-pay | Admitting: Oncology

## 2017-06-21 VITALS — BP 122/77 | HR 77 | Temp 99.9°F | Resp 18 | Ht 62.5 in | Wt 150.4 lb

## 2017-06-21 VITALS — BP 111/69 | HR 66 | Resp 18

## 2017-06-21 DIAGNOSIS — Z6825 Body mass index (BMI) 25.0-25.9, adult: Secondary | ICD-10-CM | POA: Insufficient documentation

## 2017-06-21 DIAGNOSIS — L299 Pruritus, unspecified: Secondary | ICD-10-CM | POA: Diagnosis not present

## 2017-06-21 DIAGNOSIS — K5900 Constipation, unspecified: Secondary | ICD-10-CM | POA: Insufficient documentation

## 2017-06-21 DIAGNOSIS — D473 Essential (hemorrhagic) thrombocythemia: Secondary | ICD-10-CM | POA: Insufficient documentation

## 2017-06-21 DIAGNOSIS — D509 Iron deficiency anemia, unspecified: Secondary | ICD-10-CM

## 2017-06-21 DIAGNOSIS — D75839 Thrombocytosis, unspecified: Secondary | ICD-10-CM

## 2017-06-21 DIAGNOSIS — Z79899 Other long term (current) drug therapy: Secondary | ICD-10-CM | POA: Diagnosis not present

## 2017-06-21 DIAGNOSIS — Z87891 Personal history of nicotine dependence: Secondary | ICD-10-CM | POA: Insufficient documentation

## 2017-06-21 LAB — CBC WITH DIFFERENTIAL/PLATELET
BASOS ABS: 0.1 10*3/uL (ref 0–0.1)
BASOS PCT: 1 %
EOS ABS: 0.2 10*3/uL (ref 0–0.7)
EOS PCT: 3 %
HCT: 34.3 % — ABNORMAL LOW (ref 35.0–47.0)
Hemoglobin: 11.2 g/dL — ABNORMAL LOW (ref 12.0–16.0)
Lymphocytes Relative: 29 %
Lymphs Abs: 1.8 10*3/uL (ref 1.0–3.6)
MCH: 26.1 pg (ref 26.0–34.0)
MCHC: 32.6 g/dL (ref 32.0–36.0)
MCV: 80.2 fL (ref 80.0–100.0)
MONO ABS: 0.6 10*3/uL (ref 0.2–0.9)
Monocytes Relative: 9 %
Neutro Abs: 3.6 10*3/uL (ref 1.4–6.5)
Neutrophils Relative %: 58 %
PLATELETS: 492 10*3/uL — AB (ref 150–440)
RBC: 4.28 MIL/uL (ref 3.80–5.20)
RDW: 16.8 % — AB (ref 11.5–14.5)
WBC: 6.2 10*3/uL (ref 3.6–11.0)

## 2017-06-21 LAB — SEDIMENTATION RATE: SED RATE: 11 mm/h (ref 0–20)

## 2017-06-21 MED ORDER — SODIUM CHLORIDE 0.9 % IV SOLN
510.0000 mg | Freq: Once | INTRAVENOUS | Status: AC
  Administered 2017-06-21: 510 mg via INTRAVENOUS
  Filled 2017-06-21: qty 17

## 2017-06-21 MED ORDER — SODIUM CHLORIDE 0.9 % IV SOLN
Freq: Once | INTRAVENOUS | Status: AC
  Administered 2017-06-21: 11:00:00 via INTRAVENOUS
  Filled 2017-06-21: qty 1000

## 2017-06-21 NOTE — Progress Notes (Signed)
Patient here today as new evaluation regarding thrombocytosis and iron deficiency anemia.  Patient states she tried taking oral iron but stopped because it made her nauseated.

## 2017-06-21 NOTE — Progress Notes (Signed)
Hematology/Oncology Consult note [facility] [phone] [phone]  Patient Care Team: Leone Haven, MD as PCP - General (Family Medicine)   Name of the patient: Melinda Holder  [id number]  August 18, 1971    Reason for referral- thrombocytosis   Referring physician- Dr. Caryl Bis  Date of visit: 06/21/17   History of presenting illness- patient is a 46 year old female with a history of iron deficiency anemia, constipation pruritus who has been referred to [place] for evaluation of thrombocytosis. Recent CBC from 06/04/2017 showed white count of 6.1, H&H of 10.7/33.1 with an MCV of 80.8 and a platelet count of 594. Iron studies showed low ferritin of 4.9, low serum iron of 23 and iron saturation of 5%. TSH and LFTs were within normal limits. One year ago her H&H was 10.8/32.6 and platelet count was 529. CBC from February 2014 was normal including a platelet count of 414.  Patient does report having menstrual bleeding. Her menstrual cycles lasts for 5 days for the first 2-3 days a particularly heavy and she needs to change her son to 3 pads every hour or so. She has seen gynecology in the past and was recommended birth control pills but after going through the side effects she did not wish to take them. Denies any consistent use of NSAIDs. Occasionally takes them once a week for headache. Denies any bright red blood in stools, dark tarry stools or blood loss in her stool or urine. Denies any nosebleeds or gum bleeds. Reports some fatigue. Denies other complaints. No family history of colon cancer. Her appetite is good and she denies any unintentional weight loss.  ECOG PS- 0  Pain scale- 0   Review of systems- Review of Systems  Constitutional: Positive for malaise/fatigue. Negative for chills, fever and weight loss.  HENT: Negative for congestion, ear discharge and nosebleeds.   Eyes: Negative for blurred vision.  Respiratory: Negative for  cough, hemoptysis, sputum production, shortness of breath and wheezing.   Cardiovascular: Negative for chest pain, palpitations, orthopnea and claudication.  Gastrointestinal: Negative for abdominal pain, blood in stool, constipation, diarrhea, heartburn, melena, nausea and vomiting.  Genitourinary: Negative for dysuria, flank pain, frequency, hematuria and urgency.  Musculoskeletal: Negative for back pain, joint pain and myalgias.  Skin: Negative for rash.  Neurological: Negative for dizziness, tingling, focal weakness, seizures, weakness and headaches.  Endo/Heme/Allergies: Does not bruise/bleed easily.  Psychiatric/Behavioral: Negative for depression and suicidal ideas. The patient does not have insomnia.     No Known Allergies  Patient Active Problem List   Diagnosis Date Noted  . Ankle swelling, right 05/20/2017  . Iron deficiency anemia 05/20/2017  . Constipation 05/20/2017  . Encounter for general adult medical examination with abnormal findings 02/27/2016  . Pruritus 09/03/2015  . Overweight (BMI 25.0-29.9) 09/03/2015  . Adnexal pain 11/18/2012     Past Medical History:  Diagnosis Date  . Amblyopia of eye, right   . Endometriosis      Past Surgical History:  Procedure Laterality Date  . ABLATION ON ENDOMETRIOSIS    . CLAVICLE SURGERY Left 10/2016  . EYE MUSCLE SURGERY Right    child  . ORIF CLAVICULAR FRACTURE Left 11/03/2016   Procedure: OPEN REDUCTION INTERNAL FIXATION (ORIF) CLAVICULAR FRACTURE;  Surgeon: Corky Mull, MD;  Location: [facility] ORS;  Service: Orthopedics;  Laterality: Left;  . removal of fibroid      Social History   Social History  . Marital status: Married    Spouse name: N/A  .  Number of children: N/A  . Years of education: N/A   Occupational History  . Not on file.   Social History Main Topics  . Smoking status: Former Smoker    Packs/day: 1.00    Years: 15.00    Types: Cigarettes    Quit date: 02/21/2015  . Smokeless tobacco: Never  Used  . Alcohol use 0.0 oz/week     Comment: Occasional   . Drug use: No  . Sexual activity: Yes    Partners: Male     Comment: 1 female   Other Topics Concern  . 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 Lupus Mother   . Hypertension Mother   . Hypertension Father   . Diabetes Father   . Cancer Paternal Aunt        Breast Cancer  . Cancer Paternal Aunt        Lung Cancer  . Cancer Other   . Breast cancer Neg Hx      Current Outpatient Prescriptions:  Marland Kitchen  Glucosamine-Chondroit-Vit C-Mn (GLUCOSAMINE 1500 COMPLEX PO), Take by mouth., Disp: , Rfl:  .  hydrOXYzine (ATARAX/VISTARIL) 10 MG tablet, TAKE 1 TABLET (10 MG TOTAL) BY MOUTH AT BEDTIME., Disp: 30 tablet, Rfl: 5 .  ibuprofen (ADVIL,MOTRIN) 200 MG tablet, Take 400 mg by mouth every 6 (six) hours as needed for headache or moderate pain., Disp: , Rfl:  .  oxyCODONE (ROXICODONE) 5 MG immediate release tablet, Take 1-2 tablets (5-10 mg total) by mouth every 4 (four) hours as needed for severe pain., Disp: 50 tablet, Rfl: 0 .  Turmeric 500 MG CAPS, Take 1 tablet by mouth daily., Disp: , Rfl:  .  norethindrone (MICRONOR,CAMILA,ERRIN) 0.35 MG tablet, Take 1 tablet (0.35 mg total) by mouth daily. (Patient not taking: Reported on 06/21/2017), Disp: 1 Package, Rfl: 11   Physical exam:  Vitals:   06/21/17 0954  BP: 122/77  Pulse: 77  Resp: 18  Temp: 99.9 F (37.7 C)  TempSrc: Tympanic  Weight: 150 lb 5.7 oz (68.2 kg)  Height: 5' 2.5" (1.588 m)   Physical Exam  Constitutional: She is oriented to person, place, and time and well-developed, well-nourished, and in no distress.  HENT:  Head: Normocephalic and atraumatic.  Eyes: EOM are normal. Pupils are equal, round, and reactive to light.  Neck: Normal range of motion.    Cardiovascular: Normal rate, regular rhythm and normal heart sounds.   Pulmonary/Chest: Effort normal and breath sounds normal.  Abdominal: Soft. Bowel sounds are normal.  No palpable splenomegaly  Neurological: She is alert and oriented to person, place, and time.  Skin: Skin is warm and dry.       CMP Latest Ref Rng & Units 06/04/2017  Glucose 70 - 99 mg/dL 90  BUN 6 - 23 mg/dL 15  Creatinine 0.40 - 1.20 mg/dL 0.63  Sodium 135 - 145 mEq/L 136  Potassium 3.5 - 5.1 mEq/L 4.1  Chloride 96 - 112 mEq/L 104  CO2 19 - 32 mEq/L 26  Calcium 8.4 - 10.5 mg/dL 9.4  Total Protein 6.0 - 8.3 g/dL 7.4  Total Bilirubin 0.2 - 1.2 mg/dL 0.3  Alkaline Phos 39 - 117 U/L 40  AST 0 -  37 U/L 15  ALT 0 - 35 U/L 14   CBC Latest Ref Rng & Units 06/04/2017  WBC 4.0 - 10.5 K/uL 6.1  Hemoglobin 12.0 - 15.0 g/dL 10.7(L)  Hematocrit 36.0 - 46.0 % 33.1(L)  Platelets 150.0 - 400.0 K/uL 594.0(H)    Assessment and plan- Patient is a 46 y.o. female referred to [place] for thrombocytosis. Also noted to have iron deficiency anemia    Iron deficiency anemia- as above. Likely due to menorrhagia. Also do other work up including B12, folate, retic count, haptoglobin and myeloma panel. Recent TSH was normal. snce she has not been able to tolerate oral iron, I'll plan to give HER-2 doses of ferriheme 510 mg IV daily. I will recheck her CBC ferritin and iron studies in 2 months time. I discussed the risks and benefits of IV iron including all but not limited to headache, legs ending and risk of infusion reaction. Patient understands and agrees to proceed. I also strongly encouraged her to stay to her gynecologist regarding management of her heavy menstrual periods she continues to have iron deficiency anemia despite correcting her menorrhagia then I will refer her to GI.  Thrombocytosis- I explained to patient thrombocytosis can be reactive or secondary to primary hematological disorder. Patient has normocytic iron deficiency  anemia which can cause reactive thrombocytosis as well. Her iron panel did show evidence of iron deficiency with a ferritin of 4.9 and iron studies which showed low serum iron of 23 and iron saturation of 5%. will check cbc, pathology review of smear, ESR and JAK2 mutation testing  I will see her back in 2 months time   Thank you for this kind referral and the opportunity to participate in the care of this patient   Visit Diagnosis 1. Iron deficiency anemia, unspecified iron deficiency anemia type   2. Thrombocytosis ([place])     Dr. Randa Evens, MD, MPH [facility] at [facility] Pager- [phone] 06/21/2017 10:21 AM

## 2017-06-21 NOTE — Patient Instructions (Signed)

## 2017-06-22 LAB — PATHOLOGIST SMEAR REVIEW

## 2017-06-24 LAB — JAK2 GENOTYPR

## 2017-06-29 ENCOUNTER — Ambulatory Visit: Payer: BLUE CROSS/BLUE SHIELD

## 2017-06-29 DIAGNOSIS — K529 Noninfective gastroenteritis and colitis, unspecified: Secondary | ICD-10-CM | POA: Diagnosis not present

## 2017-06-30 ENCOUNTER — Emergency Department: Payer: BLUE CROSS/BLUE SHIELD

## 2017-06-30 ENCOUNTER — Inpatient Hospital Stay
Admission: EM | Admit: 2017-06-30 | Discharge: 2017-07-02 | DRG: 392 | Disposition: A | Discharge status: Home / Self Care | Payer: BLUE CROSS/BLUE SHIELD | Attending: Surgery | Admitting: Surgery

## 2017-06-30 DIAGNOSIS — Q433 Congenital malformations of intestinal fixation: Secondary | ICD-10-CM

## 2017-06-30 DIAGNOSIS — Z832 Family history of diseases of the blood and blood-forming organs and certain disorders involving the immune mechanism: Secondary | ICD-10-CM

## 2017-06-30 DIAGNOSIS — Z801 Family history of malignant neoplasm of trachea, bronchus and lung: Secondary | ICD-10-CM | POA: Diagnosis not present

## 2017-06-30 DIAGNOSIS — R197 Diarrhea, unspecified: Secondary | ICD-10-CM | POA: Diagnosis not present

## 2017-06-30 DIAGNOSIS — Z8249 Family history of ischemic heart disease and other diseases of the circulatory system: Secondary | ICD-10-CM

## 2017-06-30 DIAGNOSIS — K5289 Other specified noninfective gastroenteritis and colitis: Secondary | ICD-10-CM | POA: Diagnosis not present

## 2017-06-30 DIAGNOSIS — R109 Unspecified abdominal pain: Secondary | ICD-10-CM | POA: Diagnosis not present

## 2017-06-30 DIAGNOSIS — B850 Pediculosis due to Pediculus humanus capitis: Secondary | ICD-10-CM | POA: Diagnosis not present

## 2017-06-30 DIAGNOSIS — K529 Noninfective gastroenteritis and colitis, unspecified: Secondary | ICD-10-CM | POA: Diagnosis present

## 2017-06-30 DIAGNOSIS — Z833 Family history of diabetes mellitus: Secondary | ICD-10-CM

## 2017-06-30 DIAGNOSIS — Z87891 Personal history of nicotine dependence: Secondary | ICD-10-CM

## 2017-06-30 HISTORY — DX: Unspecified asthma, uncomplicated: J45.909

## 2017-06-30 LAB — CBC WITH DIFFERENTIAL/PLATELET
BASOS PCT: 0 %
Basophils Absolute: 0 10*3/uL (ref 0–0.1)
EOS ABS: 0.1 10*3/uL (ref 0–0.7)
Eosinophils Relative: 1 %
HEMATOCRIT: 30.6 % — AB (ref 35.0–47.0)
Hemoglobin: 10.1 g/dL — ABNORMAL LOW (ref 12.0–16.0)
Lymphocytes Relative: 7 %
Lymphs Abs: 0.8 10*3/uL — ABNORMAL LOW (ref 1.0–3.6)
MCH: 26.9 pg (ref 26.0–34.0)
MCHC: 33 g/dL (ref 32.0–36.0)
MCV: 81.4 fL (ref 80.0–100.0)
MONO ABS: 1 10*3/uL — AB (ref 0.2–0.9)
MONOS PCT: 8 %
Neutro Abs: 9.6 10*3/uL — ABNORMAL HIGH (ref 1.4–6.5)
Neutrophils Relative %: 84 %
Platelets: 420 10*3/uL (ref 150–440)
RBC: 3.76 MIL/uL — ABNORMAL LOW (ref 3.80–5.20)
RDW: 18.3 % — AB (ref 11.5–14.5)
WBC: 11.5 10*3/uL — ABNORMAL HIGH (ref 3.6–11.0)

## 2017-06-30 LAB — URINALYSIS, COMPLETE (UACMP) WITH MICROSCOPIC
BACTERIA UA: NONE SEEN
Bilirubin Urine: NEGATIVE
GLUCOSE, UA: NEGATIVE mg/dL
Hgb urine dipstick: NEGATIVE
Ketones, ur: NEGATIVE mg/dL
Leukocytes, UA: NEGATIVE
NITRITE: NEGATIVE
PROTEIN: NEGATIVE mg/dL
Specific Gravity, Urine: 1.011 (ref 1.005–1.030)
pH: 7 (ref 5.0–8.0)

## 2017-06-30 LAB — BASIC METABOLIC PANEL
ANION GAP: 6 (ref 5–15)
BUN: 9 mg/dL (ref 6–20)
CHLORIDE: 106 mmol/L (ref 101–111)
CO2: 24 mmol/L (ref 22–32)
Calcium: 8.4 mg/dL — ABNORMAL LOW (ref 8.9–10.3)
Creatinine, Ser: 0.53 mg/dL (ref 0.44–1.00)
GFR calc Af Amer: >60 mL/min (ref >60)
GFR calc non Af Amer: >60 mL/min (ref >60)
GLUCOSE: 121 mg/dL — AB (ref 65–99)
POTASSIUM: 3.4 mmol/L — AB (ref 3.5–5.1)
Sodium: 136 mmol/L (ref 135–145)

## 2017-06-30 LAB — HEPATIC FUNCTION PANEL
ALK PHOS: 44 U/L (ref 38–126)
ALT: 13 U/L — AB (ref 14–54)
AST: 15 U/L (ref 15–41)
Albumin: 3.2 g/dL — ABNORMAL LOW (ref 3.5–5.0)
BILIRUBIN TOTAL: 0.6 mg/dL (ref 0.3–1.2)
Total Protein: 6.7 g/dL (ref 6.5–8.1)

## 2017-06-30 LAB — LACTIC ACID, PLASMA
LACTIC ACID, VENOUS: 0.8 mmol/L (ref 0.5–1.9)
LACTIC ACID, VENOUS: 0.7 mmol/L (ref 0.5–1.9)

## 2017-06-30 LAB — HCG, QUANTITATIVE, PREGNANCY: hCG, Beta Chain, Quant, S: 1 m[IU]/mL (ref <5)

## 2017-06-30 LAB — TYPE AND SCREEN
ABO/RH(D): B POS
Antibody Screen: NEGATIVE

## 2017-06-30 MED ORDER — PIPERACILLIN-TAZOBACTAM 3.375 G IVPB 30 MIN: 3.3750 g | Freq: Once | INTRAVENOUS | Status: DC

## 2017-06-30 MED ORDER — SODIUM CHLORIDE 0.9 % IV BOLUS (SEPSIS)
1000.0000 mL | Freq: Once | INTRAVENOUS | Status: AC
  Administered 2017-06-30: 1000 mL via INTRAVENOUS

## 2017-06-30 MED ORDER — FENTANYL CITRATE (PF) 100 MCG/2ML IJ SOLN
75.0000 ug | Freq: Once | INTRAMUSCULAR | Status: AC
  Administered 2017-06-30: 75 ug via INTRAVENOUS
  Filled 2017-06-30: qty 2

## 2017-06-30 MED ORDER — IOPAMIDOL (ISOVUE-300) INJECTION 61%
100.0000 mL | Freq: Once | INTRAVENOUS | Status: AC | PRN
  Administered 2017-06-30: 100 mL via INTRAVENOUS

## 2017-06-30 MED ORDER — ALPRAZOLAM 0.5 MG PO TABS: 0.5000 mg | ORAL_TABLET | Freq: Three times a day (TID) | ORAL | Status: DC | PRN

## 2017-06-30 MED ORDER — HEPARIN SODIUM (PORCINE) 5000 UNIT/ML IJ SOLN
5000.0000 [IU] | Freq: Three times a day (TID) | INTRAMUSCULAR | Status: DC
  Administered 2017-06-30 – 2017-07-02 (×5): 5000 [IU] via SUBCUTANEOUS
  Filled 2017-06-30 (×5): qty 1

## 2017-06-30 MED ORDER — HYDROMORPHONE HCL 1 MG/ML IJ SOLN: 0.5000 mg | INTRAMUSCULAR | Status: DC | PRN

## 2017-06-30 MED ORDER — ONDANSETRON HCL 4 MG/2ML IJ SOLN
4.0000 mg | Freq: Once | INTRAMUSCULAR | Status: AC
  Administered 2017-06-30: 4 mg via INTRAVENOUS
  Filled 2017-06-30: qty 2

## 2017-06-30 MED ORDER — PANTOPRAZOLE SODIUM 40 MG IV SOLR
40.0000 mg | Freq: Two times a day (BID) | INTRAVENOUS | Status: DC
  Administered 2017-06-30 – 2017-07-02 (×4): 40 mg via INTRAVENOUS
  Filled 2017-06-30 (×4): qty 40

## 2017-06-30 MED ORDER — PIPERACILLIN-TAZOBACTAM 3.375 G IVPB 30 MIN
3.3750 g | INTRAVENOUS | Status: AC
  Administered 2017-06-30: 3.375 g via INTRAVENOUS
  Filled 2017-06-30: qty 50

## 2017-06-30 MED ORDER — DEXTROSE IN LACTATED RINGERS 5 % IV SOLN
INTRAVENOUS | Status: DC
  Administered 2017-06-30 – 2017-07-02 (×5): via INTRAVENOUS
  Filled 2017-06-30 (×4): qty 1000

## 2017-06-30 MED ORDER — ONDANSETRON HCL 4 MG/2ML IJ SOLN
4.0000 mg | Freq: Four times a day (QID) | INTRAMUSCULAR | Status: DC | PRN
Start: 2017-06-30 — End: 2017-07-02

## 2017-06-30 MED ORDER — ACETAMINOPHEN 500 MG PO TABS
1000.0000 mg | ORAL_TABLET | Freq: Four times a day (QID) | ORAL | Status: DC
  Administered 2017-06-30 – 2017-07-02 (×6): 1000 mg via ORAL
  Filled 2017-06-30 (×8): qty 2

## 2017-06-30 MED ORDER — PIPERACILLIN-TAZOBACTAM 3.375 G IVPB
3.3750 g | Freq: Three times a day (TID) | INTRAVENOUS | Status: DC
  Administered 2017-06-30 – 2017-07-02 (×5): 3.375 g via INTRAVENOUS
  Filled 2017-06-30 (×6): qty 50

## 2017-06-30 MED ORDER — ONDANSETRON 4 MG PO TBDP
4.0000 mg | ORAL_TABLET | Freq: Four times a day (QID) | ORAL | Status: DC | PRN
  Filled 2017-06-30: qty 1

## 2017-06-30 MED ORDER — OXYCODONE HCL 5 MG PO TABS
15.0000 mg | ORAL_TABLET | ORAL | Status: DC | PRN
  Administered 2017-06-30 – 2017-07-01 (×5): 15 mg via ORAL
  Filled 2017-06-30 (×5): qty 3

## 2017-06-30 NOTE — H&P (Signed)
Patient ID: Melinda Holder, female   DOB: 1971-02-03, 46 y.o.   MRN: [id number]  HPI Melinda Holder is a 46 y.o. female with a 2 day hx of abdominal pain, nausea and diarrhea. Patient reports that the pain is moderate to severe located in the right upper and lower quadrant. Only mildly relieved by narcotics in the emergency room and worsen with certain movements. Patient reports that the pain is colicky in nature and intermittent.  She reports some chills but no fevers. No sick contacts. Only abdominal operation is ablation of endometriosis. Hx of anemia requiring IV fe. She denies any history of Crohn's disease either on her self or family member. CT scan personally reviewed. 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 Additional evidence of perforation there is no evidence of free air or abscess. No evidence of volvulus. Is Slightly Elevated to 11 and Creatinine Is Normal. Case d/w Dr. Mable Paris in detail.  HPI  Past Medical History:  Diagnosis Date  . Amblyopia of eye, right   . Asthma   . Endometriosis     Past Surgical History:  Procedure Laterality Date  . ABLATION ON ENDOMETRIOSIS    . CLAVICLE SURGERY Left 10/2016  . EYE MUSCLE SURGERY Right    child  . ORIF CLAVICULAR FRACTURE Left 11/03/2016   Procedure: OPEN REDUCTION INTERNAL FIXATION (ORIF) CLAVICULAR FRACTURE;  Surgeon: Corky Mull, MD;  Location: [facility] ORS;  Service: Orthopedics;  Laterality: Left;  . removal of fibroid      Family History  Problem Relation Age of Onset  . Lupus Mother   . Hypertension Mother   . Hypertension Father   . Diabetes Father   . Cancer Paternal Aunt        Breast Cancer  . Cancer Paternal Aunt        Lung Cancer  . Cancer Other   . Breast cancer Neg Hx     Social History Social History  Substance Use Topics  . Smoking status: Former Smoker    Packs/day: 1.00    Years: 15.00    Types: Cigarettes    Quit  date: 02/21/2015  . Smokeless tobacco: Never Used  . Alcohol use 0.0 oz/week     Comment: Occasional     No Known Allergies  Current Facility-Administered Medications  Medication Dose Route Frequency Provider Last Rate Last Dose  . acetaminophen (TYLENOL) tablet 1,000 mg  1,000 mg Oral Q6H Nature Kueker F, MD      . ALPRAZolam Duanne Moron) tablet 0.5 mg  0.5 mg Oral TID PRN Jeannelle Wiens F, MD      . dextrose 5 % in lactated ringers infusion   Intravenous Continuous Ajay Strubel F, MD      . heparin injection 5,000 Units  5,000 Units Subcutaneous Q8H Dwain Huhn F, MD      . HYDROmorphone (DILAUDID) injection 0.5 mg  0.5 mg Intravenous Q2H PRN Bradan Congrove F, MD      . ondansetron (ZOFRAN-ODT) disintegrating tablet 4 mg  4 mg Oral Q6H PRN Kharon Hixon F, MD       Or  . ondansetron (ZOFRAN) injection 4 mg  4 mg Intravenous Q6H PRN Lamarion Mcevers F, MD      . oxyCODONE (Oxy IR/ROXICODONE) immediate release tablet 15 mg  15 mg Oral Q3H PRN Kairav Russomanno F, MD      . pantoprazole (PROTONIX) injection 40 mg  40 mg  Intravenous Q12H Michel Eskelson F, MD      . piperacillin-tazobactam (ZOSYN) IVPB 3.375 g  3.375 g Intravenous Q8H Vira Chaplin, Marjory Lies, MD       Current Outpatient Prescriptions  Medication Sig Dispense Refill  . dicyclomine (BENTYL) 10 MG capsule Take 10 mg by mouth every 6 (six) hours as needed for pain.    . Glucosamine-Chondroit-Vit C-Mn (GLUCOSAMINE 1500 COMPLEX PO) Take by mouth.    . hydrOXYzine (ATARAX/VISTARIL) 10 MG tablet TAKE 1 TABLET (10 MG TOTAL) BY MOUTH AT BEDTIME. 30 tablet 5  . ibuprofen (ADVIL,MOTRIN) 200 MG tablet Take 400 mg by mouth every 6 (six) hours as needed for headache or moderate pain.    Marland Kitchen ondansetron (ZOFRAN) 4 MG tablet Take 4 mg by mouth every 6 (six) hours as needed for nausea/vomiting.    . Turmeric 500 MG CAPS Take 1 tablet by mouth daily.    . norethindrone (MICRONOR,CAMILA,ERRIN) 0.35 MG tablet Take 1 tablet (0.35 mg total) by mouth daily. (Patient not taking:  Reported on 06/21/2017) 1 Package 11  . oxyCODONE (ROXICODONE) 5 MG immediate release tablet Take 1-2 tablets (5-10 mg total) by mouth every 4 (four) hours as needed for severe pain. (Patient not taking: Reported on 06/30/2017) 50 tablet 0     Review of Systems Full ROS  was asked and was negative except for the information on the HPI  Physical Exam Blood pressure 102/71, pulse 89, temperature 97.8 F (36.6 C), temperature source Oral, resp. rate 16, height 5\' 2"  (1.575 m), weight 68 kg (150 lb), last menstrual period 06/27/2017, SpO2 95 %. CONSTITUTIONAL: NAD, anxious EYES: Pupils are equal, round, and reactive to light, Sclera are non-icteric. EARS, NOSE, MOUTH AND THROAT: The oropharynx is clear. The oral mucosa is pink and moist. Hearing is intact to voice. LYMPH NODES:  Lymph nodes in the neck are normal. RESPIRATORY:  Lungs are clear. There is normal respiratory effort, with equal breath sounds bilaterally, and without pathologic use of accessory muscles. CARDIOVASCULAR: Heart is regular without murmurs, gallops, or rubs. GI: The abdomen is  soft, TTP RUQ and RLQ. Some voluntary guarding. No peritoneal signs GU: Rectal deferred.   MUSCULOSKELETAL: Normal muscle strength and tone. No cyanosis or edema.   SKIN: Turgor is good and there are no pathologic skin lesions or ulcers. NEUROLOGIC: Motor and sensation is grossly normal. Cranial nerves are grossly intact. PSYCH:  Oriented to person, place and time. Affect is normal.  Data Reviewed  I have personally reviewed the patient's imaging, laboratory findings and medical records.    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 Given that she is tender to palpation and requiring narcotics I would definitely want to admit her for with serial abdominal exams and serial x-ray to make sure she does not develop any volvulus or any complications related to her  malrotation. At this point I do think that the enteritis is a different finding none the surgery related to her malrotation. We'll keep her nothing by mouth, start aggressive crystalloid resuscitation and antibiotic therapy. No need for immediate surgical intervention at this time.D/W pt and family in detail. They understand.   Caroleen Hamman, MD FACS General Surgeon 06/30/2017, 2:54 PM

## 2017-06-30 NOTE — ED Provider Notes (Signed)
[facility] Emergency Department Provider Note  ____________________________________________   First MD Initiated Contact with Patient 06/30/17 1131     (approximate)  I have reviewed the triage vital signs and the nursing notes.   HISTORY  Chief Complaint Abdominal Pain   HPI Melinda Holder is a 46 y.o. female who self presents to the emergency Department with roughly 2-3 days of moderate to severe abdominal pain. Initially was associated with nausea vomiting and diarrhea but she has not had any loose stools today. She was seen at urgent care yesterday and was given Imodium, Zofran, and Bentyl which she says has not helped. Today rather than the diarrhea she has intense nausea and profound abdominal pain. She has no abdominal surgical history. Nothing seems to make the pain better or worse.   Past Medical History:  Diagnosis Date  . Amblyopia of eye, right   . Asthma   . Endometriosis     Patient Active Problem List   Diagnosis Date Noted  . Ankle swelling, right 05/20/2017  . Iron deficiency anemia 05/20/2017  . Constipation 05/20/2017  . Encounter for general adult medical examination with abnormal findings 02/27/2016  . Pruritus 09/03/2015  . Overweight (BMI 25.0-29.9) 09/03/2015  . Adnexal pain 11/18/2012    Past Surgical History:  Procedure Laterality Date  . ABLATION ON ENDOMETRIOSIS    . CLAVICLE SURGERY Left 10/2016  . EYE MUSCLE SURGERY Right    child  . ORIF CLAVICULAR FRACTURE Left 11/03/2016   Procedure: OPEN REDUCTION INTERNAL FIXATION (ORIF) CLAVICULAR FRACTURE;  Surgeon: Corky Mull, MD;  Location: [facility] ORS;  Service: Orthopedics;  Laterality: Left;  . removal of fibroid      Prior to Admission medications   Medication Sig Start Date End Date Taking? Authorizing Provider  dicyclomine (BENTYL) 10 MG capsule Take 10 mg by mouth every 6 (six) hours as needed for pain. 06/29/17  Yes [provider]    Glucosamine-Chondroit-Vit C-Mn (GLUCOSAMINE 1500 COMPLEX PO) Take by mouth.   Yes [provider]  hydrOXYzine (ATARAX/VISTARIL) 10 MG tablet TAKE 1 TABLET (10 MG TOTAL) BY MOUTH AT BEDTIME. 05/20/17  Yes Leone Haven, MD  ibuprofen (ADVIL,MOTRIN) 200 MG tablet Take 400 mg by mouth every 6 (six) hours as needed for headache or moderate pain.   Yes [provider]  ondansetron (ZOFRAN) 4 MG tablet Take 4 mg by mouth every 6 (six) hours as needed for nausea/vomiting. 06/29/17  Yes [provider]  Turmeric 500 MG CAPS Take 1 tablet by mouth daily.   Yes [provider]  norethindrone (MICRONOR,CAMILA,ERRIN) 0.35 MG tablet Take 1 tablet (0.35 mg total) by mouth daily. Patient not taking: Reported on 06/21/2017 02/24/17   Rod Can, CNM  oxyCODONE (ROXICODONE) 5 MG immediate release tablet Take 1-2 tablets (5-10 mg total) by mouth every 4 (four) hours as needed for severe pain. Patient not taking: Reported on 06/30/2017 11/03/16   Poggi, Marshall Cork, MD    Allergies Patient has no known allergies.  Family History  Problem Relation Age of Onset  . Lupus Mother   . Hypertension Mother   . Hypertension Father   . Diabetes Father   . Cancer Paternal Aunt        Breast Cancer  . Cancer Paternal Aunt        Lung Cancer  . Cancer Other   . Breast cancer Neg Hx     Social History Social History  Substance Use Topics  .  Smoking status: Former Smoker    Packs/day: 1.00    Years: 15.00    Types: Cigarettes    Quit date: 02/21/2015  . Smokeless tobacco: Never Used  . Alcohol use 0.0 oz/week     Comment: Occasional     Review of Systems Constitutional: No fever/chills Eyes: No visual changes. ENT: No sore throat. Cardiovascular: Denies chest pain. Respiratory: Denies shortness of breath. Gastrointestinal: Positive abdominal pain.  Positive nausea, Positive vomiting.  Positive diarrhea.  No constipation. Genitourinary: Negative for  dysuria. Musculoskeletal: Negative for back pain. Skin: Negative for rash. Neurological: Negative for headaches, focal weakness or numbness.   ____________________________________________   PHYSICAL EXAM:  VITAL SIGNS: ED Triage Vitals  Enc Vitals Group     BP 06/30/17 1119 110/69     Pulse Rate 06/30/17 1119 (!) 104     Resp 06/30/17 1119 17     Temp 06/30/17 1119 97.8 F (36.6 C)     Temp Source 06/30/17 1119 Oral     SpO2 06/30/17 1119 100 %     Weight 06/30/17 1117 150 lb (68 kg)     Height 06/30/17 1117 5\' 2"  (1.575 m)     Head Circumference --      Peak Flow --      Pain Score 06/30/17 1117 9     Pain Loc --      Pain Edu? --      Excl. in [place]? --     Constitutional: Alert and oriented 4 tearful and uncomfortable appearing Eyes: PERRL EOMI. Head: Atraumatic. Nose: No congestion/rhinnorhea. Mouth/Throat: No trismus Neck: No stridor.   Cardiovascular: Tachycardic rate, regular rhythm. Grossly normal heart sounds.  Good peripheral circulation. Respiratory: Normal respiratory effort.  No retractions. Lungs CTAB and moving good air Gastrointestinal: Significant tenderness over McBurney's with rebound and guarding negative Rovsing's Musculoskeletal: No lower extremity edema   Neurologic:  Normal speech and language. No gross focal neurologic deficits are appreciated. Skin:  Skin is warm, dry and intact. No rash noted. Psychiatric: Mood and affect are normal. Speech and behavior are normal.    ____________________________________________   DIFFERENTIAL includes but not limited to  Appendicitis, diverticulitis, colitis, ovarian torsion, Enteritis, bowel obstruction ____________________________________________   LABS (all labs ordered are listed, but only abnormal results are displayed)  Labs Reviewed  BASIC METABOLIC PANEL - Abnormal; Notable for the following:       Result Value   Potassium 3.4 (*)    Glucose, Bld 121 (*)    Calcium 8.4 (*)    All other  components within normal limits  HEPATIC FUNCTION PANEL - Abnormal; Notable for the following:    Albumin 3.2 (*)    ALT 13 (*)    Bilirubin, Direct <0.1 (*)    All other components within normal limits  CBC WITH DIFFERENTIAL/PLATELET - Abnormal; Notable for the following:    WBC 11.5 (*)    RBC 3.76 (*)    Hemoglobin 10.1 (*)    HCT 30.6 (*)    RDW 18.3 (*)    Neutro Abs 9.6 (*)    Lymphs Abs 0.8 (*)    Monocytes Absolute 1.0 (*)    All other components within normal limits  URINALYSIS, COMPLETE (UACMP) WITH MICROSCOPIC - Abnormal; Notable for the following:    Color, Urine YELLOW (*)    APPearance HAZY (*)    Squamous Epithelial / LPF 0-5 (*)    All other components within normal limits  CULTURE, BLOOD (ROUTINE X 2)  CULTURE, BLOOD (ROUTINE X 2)  HCG, QUANTITATIVE, PREGNANCY  LACTIC ACID, PLASMA  LACTIC ACID, PLASMA  TYPE AND SCREEN    Slight white count and nonspecific  EKG   ____________________________________________  RADIOLOGY  CT scan shows enteritis as well as a malrotation over no evidence of small bowel obstruction or large bowel obstruction ____________________________________________   PROCEDURES  Procedure(s) performed: no  Procedures  Critical Care performed: no  Observation: no ____________________________________________   INITIAL IMPRESSION / ASSESSMENT AND PLAN / ED COURSE  Pertinent labs & imaging results that were available during my care of the patient were reviewed by me and considered in my medical decision making (see chart for details).  The patient is in significant pain in the right lower quadrant concerning for appendicitis. Labs fluids and CT scan are pending.      ___----------------------------------------- 1:44 PM on 06/30/2017 ----------------------------------------- The patient's CT scan shows a likely infection in the jejunum along with small bowel malrotation. I have discussed the case with on-call surgeon Dr.  Dahlia Byes who will kindly consult on the case. I will check a lactate and blood cultures now as well as give her a dose of Zosyn. _________________________________________   ----------------------------------------- 2:25 PM on 06/30/2017 -----------------------------------------  Dr. Dahlia Byes has evaluated the patient and will kindly admit her to his service.  He believes the malrotation is incidental as she is not obstructed.  FINAL CLINICAL IMPRESSION(S) / ED DIAGNOSES  Final diagnoses:  Malrotation of intestine      NEW MEDICATIONS STARTED DURING THIS VISIT:  New Prescriptions   No medications on file     Note:  This document was prepared using Dragon voice recognition software and may include unintentional dictation errors.     Darel Hong, MD 06/30/17 1426

## 2017-06-30 NOTE — ED Notes (Signed)
Pt transported to CT ?

## 2017-06-30 NOTE — ED Triage Notes (Signed)
Pt c/o abd cramping with diarrhea since Monday.

## 2017-06-30 NOTE — ED Notes (Signed)
Pt has returned from CT. NAD noted. Pt continues to report pain.

## 2017-07-01 ENCOUNTER — Inpatient Hospital Stay: Payer: BLUE CROSS/BLUE SHIELD

## 2017-07-01 DIAGNOSIS — K529 Noninfective gastroenteritis and colitis, unspecified: Secondary | ICD-10-CM

## 2017-07-01 LAB — CBC
HEMATOCRIT: 27.4 % — AB (ref 35.0–47.0)
Hemoglobin: 9.1 g/dL — ABNORMAL LOW (ref 12.0–16.0)
MCH: 27.7 pg (ref 26.0–34.0)
MCHC: 33.3 g/dL (ref 32.0–36.0)
MCV: 83.2 fL (ref 80.0–100.0)
PLATELETS: 366 10*3/uL (ref 150–440)
RBC: 3.29 MIL/uL — AB (ref 3.80–5.20)
RDW: 18.1 % — ABNORMAL HIGH (ref 11.5–14.5)
WBC: 7 10*3/uL (ref 3.6–11.0)

## 2017-07-01 LAB — BASIC METABOLIC PANEL
ANION GAP: 3 — AB (ref 5–15)
BUN: 6 mg/dL (ref 6–20)
CO2: 28 mmol/L (ref 22–32)
Calcium: 7.9 mg/dL — ABNORMAL LOW (ref 8.9–10.3)
Chloride: 106 mmol/L (ref 101–111)
Creatinine, Ser: 0.67 mg/dL (ref 0.44–1.00)
GFR calc Af Amer: >60 mL/min (ref >60)
GFR calc non Af Amer: >60 mL/min (ref >60)
Glucose, Bld: 127 mg/dL — ABNORMAL HIGH (ref 65–99)
Potassium: 3.4 mmol/L — ABNORMAL LOW (ref 3.5–5.1)
Sodium: 137 mmol/L (ref 135–145)

## 2017-07-01 MED ORDER — POTASSIUM CHLORIDE CRYS ER 20 MEQ PO TBCR
40.0000 meq | EXTENDED_RELEASE_TABLET | Freq: Two times a day (BID) | ORAL | Status: DC
  Administered 2017-07-01 – 2017-07-02 (×3): 40 meq via ORAL
  Filled 2017-07-01 (×3): qty 2

## 2017-07-01 NOTE — Progress Notes (Signed)
CC: entiritis pt hx malrotation  Subjective: Feeling much better, she is hungry KUB reviewed, no free air, air in colon no volvulus She is passing gas WBC and lactate nml  Objective: Vital signs in last 24 hours: Temp:  [97.6 F (36.4 C)-98.8 F (37.1 C)] 97.6 F (36.4 C) (07/05 0737) Pulse Rate:  [83-104] 83 (07/05 0737) Resp:  [16-20] 18 (07/05 0737) BP: (93-110)/(53-71) 101/60 (07/05 0737) SpO2:  [91 %-100 %] 92 % (07/05 0737) Weight:  [67.8 kg (149 lb 8 oz)-68 kg (150 lb)] 67.8 kg (149 lb 8 oz) (07/04 1613) Last BM Date: 06/27/17  Intake/Output from previous day: 07/04 0701 - 07/05 0700 In: 3575 [I.V.:1475; IV Piggyback:2100] Out: -  Intake/Output this shift: No intake/output data recorded.  Physical exam: NAD, alert Chest: NSR, CTA. No murmurs Abd: soft, Mild TTP RUQ and RLQ, no peritonitis. No rebound. Already improved exam Ext: no edema, well perfused  Lab Results: CBC   Recent Labs  06/30/17 1157 07/01/17 0713  WBC 11.5* 7.0  HGB 10.1* 9.1*  HCT 30.6* 27.4*  PLT 420 366   BMET  Recent Labs  06/30/17 1157 07/01/17 0713  NA 136 137  K 3.4* 3.4*  CL 106 106  CO2 24 28  GLUCOSE 121* 127*  BUN 9 6  CREATININE 0.53 0.67  CALCIUM 8.4* 7.9*   PT/INR No results for input(s): LABPROT, INR in the last 72 hours. ABG No results for input(s): PHART, HCO3 in the last 72 hours.  Invalid input(s): PCO2, PO2  Studies/Results: Ct Abdomen Pelvis W Contrast  Result Date: 06/30/2017 CLINICAL DATA:  Abdominal cramping and diarrhea since Monday. History of fibroid tumor removal. EXAM: CT ABDOMEN AND PELVIS WITH CONTRAST TECHNIQUE: Multidetector CT imaging of the abdomen and pelvis was performed using the standard protocol following bolus administration of intravenous contrast. CONTRAST:  171mL ISOVUE-300 IOPAMIDOL (ISOVUE-300) INJECTION 61% COMPARISON:  None. FINDINGS: Lower chest: There is minimal atelectasis in the right lower lobe. Heart size is normal. No  imaged pericardial effusion or significant coronary artery calcifications. Hepatobiliary: No focal liver abnormality is seen. No gallstones, gallbladder wall thickening, or biliary dilatation. Pancreas: Unremarkable. No pancreatic ductal dilatation or surrounding inflammatory changes. Spleen: Normal in size without focal abnormality. Adrenals/Urinary Tract: Normal adrenal glands. No hydronephrosis. Suspect nonobstructing intrarenal calculi in the midpole of left kidney, measuring four and 5 mm in diameter. Stomach/Bowel: The stomach is normal in appearance. There is malrotation of the bowel. Small bowel loops are identified primarily within the right upper and lower quadrants of the abdomen. The jejunal loops are mildly thickened without evidence for obstruction. The colon is primarily in the left-sided abdomen, with the cecum in the left midline pelvis. The appendix is well seen just to the right of the uterus on image 74 of series 2 and has a normal appearance. Vascular/Lymphatic: Small bilateral external iliac nodes are present. On the right this measures 1.1 cm. On the left, 0.8 cm. A left internal iliac lymph node is 1.1 cm. There is mild atherosclerotic calcification of the abdominal aorta not associated with aneurysm. Reproductive: The left ovary is enlarged and heterogeneous, measuring 4.7 x 3.6 x 3.0 cm and warranting further characterization. The region of the right ovary is normal in appearance. The uterus appears somewhat globular and enlarged. Other: No abdominal wall hernia or abnormality. No abdominopelvic ascites. Musculoskeletal: Hemangioma of vertebral body T10. No suspicious lytic or blastic lesions. IMPRESSION: 1. Thick walled jejunal loops within the right upper quadrant consistent with inflammatory/infectious etiology.  2. Malrotation of the bowel. Small bowel loops are within the right abdomen large bowel loops are within the left abdomen. There is no evidence for bowel obstruction. 3. Suspect  nonobstructing intrarenal calculi in the midpole region of the left kidney. 4. Enlarged left ovary, mildly heterogeneous in appearance and warranting further characterization with ultrasound. 5. Small pelvic lymph nodes may be reactive but could be important depending on further assessment of the left ovary. 6. Enlarged, globular uterus. Electronically Signed   By: Nolon Nations M.D.   On: 06/30/2017 13:36   Dg Abd Acute W/chest  Result Date: 07/01/2017 CLINICAL DATA:  Enteritis. EXAM: DG ABDOMEN ACUTE W/ 1V CHEST COMPARISON:  CT 06/30/2017.  Chest x-ray 10/27/2016. FINDINGS: Mild left base subsegmental atelectasis. Chest is otherwise clear. Malrotation of bowel again noted with small bowel in the right abdomen and colon in the left. Mild distention of loops of small bowel with air-fluid levels are again noted. No interim change . Colonic gas pattern is unremarkable. Findings again consistent with a process such as enteritis. Follow-up exam is suggested to demonstrate resolution of small bowel distention. No free air. No acute bony abnormality. Prior left clavicular plate and screw fixation. IMPRESSION: 1. Persistent slightly dilated loops of small bowel with air-fluid levels are again noted. Findings again consistent with a process such as enteritis. Colonic gas pattern is unremarkable. No free air. Follow-up exam suggested demonstrate resolution small-bowel distention. 2. Malrotation of bowel again noted with small bowel in the right abdomen and colon in the left. Electronically Signed   By: Marcello Moores  Register   On: 07/01/2017 06:42    Anti-infectives: Anti-infectives    Start     Dose/Rate Route Frequency Ordered Stop   06/30/17 2200  piperacillin-tazobactam (ZOSYN) IVPB 3.375 g     3.375 g 12.5 mL/hr over 240 Minutes Intravenous Every 8 hours 06/30/17 1454     06/30/17 1400  piperacillin-tazobactam (ZOSYN) IVPB 3.375 g     3.375 g 100 mL/hr over 30 Minutes Intravenous STAT 06/30/17 1347 06/30/17  2012   06/30/17 1345  piperacillin-tazobactam (ZOSYN) IVPB 3.375 g  Status:  Discontinued     3.375 g 100 mL/hr over 30 Minutes Intravenous  Once 06/30/17 1341 06/30/17 1347      Assessment/Plan: Doing well Clears today Continue A/Bs DC in am if she continues to improve No surgical indication at this time   Caroleen Hamman, MD, FACS  07/01/2017

## 2017-07-02 ENCOUNTER — Inpatient Hospital Stay: Payer: BLUE CROSS/BLUE SHIELD

## 2017-07-02 ENCOUNTER — Inpatient Hospital Stay: Payer: BLUE CROSS/BLUE SHIELD | Attending: Oncology

## 2017-07-02 LAB — BASIC METABOLIC PANEL
Anion gap: 3 — ABNORMAL LOW (ref 5–15)
BUN: <5 mg/dL — ABNORMAL LOW (ref 6–20)
CALCIUM: 8.2 mg/dL — AB (ref 8.9–10.3)
CO2: 27 mmol/L (ref 22–32)
CREATININE: 0.5 mg/dL (ref 0.44–1.00)
Chloride: 108 mmol/L (ref 101–111)
GFR calc non Af Amer: >60 mL/min (ref >60)
Glucose, Bld: 106 mg/dL — ABNORMAL HIGH (ref 65–99)
Potassium: 3.8 mmol/L (ref 3.5–5.1)
Sodium: 138 mmol/L (ref 135–145)

## 2017-07-02 LAB — CBC
HEMATOCRIT: 25.9 % — AB (ref 35.0–47.0)
Hemoglobin: 8.7 g/dL — ABNORMAL LOW (ref 12.0–16.0)
MCH: 28 pg (ref 26.0–34.0)
MCHC: 33.8 g/dL (ref 32.0–36.0)
MCV: 82.8 fL (ref 80.0–100.0)
Platelets: 399 10*3/uL (ref 150–440)
RBC: 3.13 MIL/uL — ABNORMAL LOW (ref 3.80–5.20)
RDW: 18.6 % — AB (ref 11.5–14.5)
WBC: 4.7 10*3/uL (ref 3.6–11.0)

## 2017-07-02 LAB — HIV ANTIBODY (ROUTINE TESTING W REFLEX): HIV SCREEN 4TH GENERATION: NONREACTIVE

## 2017-07-02 MED ORDER — METRONIDAZOLE 500 MG PO TABS: 500.0000 mg | ORAL_TABLET | Freq: Three times a day (TID) | ORAL | 0 refills | Status: AC

## 2017-07-02 MED ORDER — CIPROFLOXACIN HCL 500 MG PO TABS: 500.0000 mg | ORAL_TABLET | Freq: Two times a day (BID) | ORAL | 0 refills | Status: AC

## 2017-07-02 NOTE — Discharge Summary (Signed)
Patient ID: Melinda Holder MRN: [id number] DOB/AGE: 45-Sep-1972 45 y.o.  Admit date: 06/30/2017 Discharge date: 07/02/2017   Discharge Diagnoses:  Active Problems:   Enteritis   Procedures:none  Hospital Course: 46 year old female admitted with abdominal pain. Workup included a CT scan of abdomen and pelvis that showed evidence of baseline rotation and some antritis in the right upper quadrant. Patient did not have any peritoneal signs or signs of bowel ischemia and was admitted for antibiotics and by mouth and IV hydration. She continued to do well on serial abdominal exams were benign as well as serial x-rays showing resolution of mildly dilated loops of small bowel. No evidence of free air and no evidence of ischemia. The time of discharge she was ambulating, tolerating regular diet and pain-free. Her physical exam showed a female in no acute distress. Awake and alert. Abdomen: Soft, nontender no peritonitis. Extremities: Well-perfused and no edema. Condition at the time of discharge is stable   Disposition: 01-Home or Self Care  Discharge Instructions    Call MD for:  difficulty breathing, headache or visual disturbances    Complete by:  As directed    Call MD for:  extreme fatigue    Complete by:  As directed    Call MD for:  hives    Complete by:  As directed    Call MD for:  persistant dizziness or light-headedness    Complete by:  As directed    Call MD for:  persistant nausea and vomiting    Complete by:  As directed    Call MD for:  redness, tenderness, or signs of infection (pain, swelling, redness, odor or green/yellow discharge around incision site)    Complete by:  As directed    Call MD for:  severe uncontrolled pain    Complete by:  As directed    Call MD for:  temperature >100.4    Complete by:  As directed    Diet - low sodium heart healthy    Complete by:  As directed    Increase activity slowly    Complete by:  As directed    No dressing needed    Complete  by:  As directed      Allergies as of 07/02/2017   No Known Allergies     Medication List    TAKE these medications   ciprofloxacin 500 MG tablet Commonly known as:  CIPRO Take 1 tablet (500 mg total) by mouth 2 (two) times daily.   dicyclomine 10 MG capsule Commonly known as:  BENTYL Take 10 mg by mouth every 6 (six) hours as needed for pain.   GLUCOSAMINE 1500 COMPLEX PO Take by mouth.   hydrOXYzine 10 MG tablet Commonly known as:  ATARAX/VISTARIL TAKE 1 TABLET (10 MG TOTAL) BY MOUTH AT BEDTIME.   ibuprofen 200 MG tablet Commonly known as:  ADVIL,MOTRIN Take 400 mg by mouth every 6 (six) hours as needed for headache or moderate pain.   metroNIDAZOLE 500 MG tablet Commonly known as:  FLAGYL Take 1 tablet (500 mg total) by mouth 3 (three) times daily.   norethindrone 0.35 MG tablet Commonly known as:  MICRONOR,CAMILA,ERRIN Take 1 tablet (0.35 mg total) by mouth daily.   ondansetron 4 MG tablet Commonly known as:  ZOFRAN Take 4 mg by mouth every 6 (six) hours as needed for nausea/vomiting.   oxyCODONE 5 MG immediate release tablet Commonly known as:  ROXICODONE Take 1-2 tablets (5-10 mg total) by mouth every 4 (four) hours  as needed for severe pain.   Turmeric 500 MG Caps Take 1 tablet by mouth daily.         Caroleen Hamman, MD FACS

## 2017-07-02 NOTE — Progress Notes (Signed)
Pt is being discharged today. Discharge instructions reviewed with the patient, she verified understanding, 0 paper prescriptions were given. All belongings packed and returned to her. IV x1 removed. She was rolled out in a wheelchair by staff.

## 2017-07-05 LAB — CULTURE, BLOOD (ROUTINE X 2)
Culture: NO GROWTH
Culture: NO GROWTH
SPECIAL REQUESTS: ADEQUATE
Special Requests: ADEQUATE

## 2017-07-20 ENCOUNTER — Inpatient Hospital Stay: Payer: BLUE CROSS/BLUE SHIELD | Attending: Oncology

## 2017-07-20 VITALS — BP 112/72 | HR 85 | Temp 99.2°F | Resp 18

## 2017-07-20 DIAGNOSIS — D473 Essential (hemorrhagic) thrombocythemia: Secondary | ICD-10-CM | POA: Insufficient documentation

## 2017-07-20 DIAGNOSIS — D509 Iron deficiency anemia, unspecified: Secondary | ICD-10-CM | POA: Insufficient documentation

## 2017-07-20 MED ORDER — SODIUM CHLORIDE 0.9 % IV SOLN
Freq: Once | INTRAVENOUS | Status: AC
  Administered 2017-07-20: 09:00:00 via INTRAVENOUS
  Filled 2017-07-20: qty 1000

## 2017-07-20 MED ORDER — SODIUM CHLORIDE 0.9 % IV SOLN
510.0000 mg | Freq: Once | INTRAVENOUS | Status: AC
  Administered 2017-07-20: 510 mg via INTRAVENOUS
  Filled 2017-07-20: qty 17

## 2017-07-20 NOTE — Patient Instructions (Signed)

## 2017-07-26 ENCOUNTER — Telehealth: Payer: Self-pay | Admitting: *Deleted

## 2017-07-26 NOTE — Telephone Encounter (Signed)
Noted  

## 2017-07-26 NOTE — Telephone Encounter (Signed)
FYI Marcene Brawn from [employer] is now the nurse case manger for patient in regards to pt's [place] [phone]

## 2017-07-26 NOTE — Telephone Encounter (Signed)
fyi

## 2017-08-20 ENCOUNTER — Inpatient Hospital Stay: Payer: BLUE CROSS/BLUE SHIELD | Attending: Oncology

## 2017-08-20 DIAGNOSIS — R7989 Other specified abnormal findings of blood chemistry: Secondary | ICD-10-CM | POA: Insufficient documentation

## 2017-08-20 DIAGNOSIS — Z79899 Other long term (current) drug therapy: Secondary | ICD-10-CM | POA: Insufficient documentation

## 2017-08-20 DIAGNOSIS — D509 Iron deficiency anemia, unspecified: Secondary | ICD-10-CM | POA: Diagnosis not present

## 2017-08-20 DIAGNOSIS — K5900 Constipation, unspecified: Secondary | ICD-10-CM | POA: Diagnosis not present

## 2017-08-20 DIAGNOSIS — J45909 Unspecified asthma, uncomplicated: Secondary | ICD-10-CM | POA: Diagnosis not present

## 2017-08-20 DIAGNOSIS — Z87891 Personal history of nicotine dependence: Secondary | ICD-10-CM | POA: Diagnosis not present

## 2017-08-20 DIAGNOSIS — D473 Essential (hemorrhagic) thrombocythemia: Secondary | ICD-10-CM

## 2017-08-20 DIAGNOSIS — D75839 Thrombocytosis, unspecified: Secondary | ICD-10-CM

## 2017-08-20 DIAGNOSIS — L299 Pruritus, unspecified: Secondary | ICD-10-CM | POA: Insufficient documentation

## 2017-08-20 LAB — IRON AND TIBC
IRON: 101 ug/dL (ref 28–170)
Saturation Ratios: 35 % — ABNORMAL HIGH (ref 10.4–31.8)
TIBC: 291 ug/dL (ref 250–450)
UIBC: 191 ug/dL

## 2017-08-20 LAB — CBC
HEMATOCRIT: 40.9 % (ref 35.0–47.0)
HEMOGLOBIN: 13.7 g/dL (ref 12.0–16.0)
MCH: 29.7 pg (ref 26.0–34.0)
MCHC: 33.4 g/dL (ref 32.0–36.0)
MCV: 89 fL (ref 80.0–100.0)
Platelets: 362 10*3/uL (ref 150–440)
RBC: 4.6 MIL/uL (ref 3.80–5.20)
RDW: 21 % — AB (ref 11.5–14.5)
WBC: 6.9 10*3/uL (ref 3.6–11.0)

## 2017-08-20 LAB — FERRITIN: Ferritin: 85 ng/mL (ref 11–307)

## 2017-08-23 ENCOUNTER — Encounter: Payer: Self-pay | Admitting: Oncology

## 2017-08-23 ENCOUNTER — Inpatient Hospital Stay (HOSPITAL_BASED_OUTPATIENT_CLINIC_OR_DEPARTMENT_OTHER): Payer: BLUE CROSS/BLUE SHIELD | Admitting: Oncology

## 2017-08-23 ENCOUNTER — Inpatient Hospital Stay: Payer: BLUE CROSS/BLUE SHIELD

## 2017-08-23 VITALS — BP 122/77 | HR 86 | Temp 98.8°F | Resp 18 | Wt 152.1 lb

## 2017-08-23 DIAGNOSIS — Z79899 Other long term (current) drug therapy: Secondary | ICD-10-CM | POA: Diagnosis not present

## 2017-08-23 DIAGNOSIS — Z87891 Personal history of nicotine dependence: Secondary | ICD-10-CM | POA: Diagnosis not present

## 2017-08-23 DIAGNOSIS — J45909 Unspecified asthma, uncomplicated: Secondary | ICD-10-CM | POA: Diagnosis not present

## 2017-08-23 DIAGNOSIS — L299 Pruritus, unspecified: Secondary | ICD-10-CM | POA: Diagnosis not present

## 2017-08-23 DIAGNOSIS — R7989 Other specified abnormal findings of blood chemistry: Secondary | ICD-10-CM

## 2017-08-23 DIAGNOSIS — D509 Iron deficiency anemia, unspecified: Secondary | ICD-10-CM | POA: Diagnosis not present

## 2017-08-23 DIAGNOSIS — L501 Idiopathic urticaria: Secondary | ICD-10-CM | POA: Diagnosis not present

## 2017-08-23 DIAGNOSIS — K5900 Constipation, unspecified: Secondary | ICD-10-CM | POA: Diagnosis not present

## 2017-08-23 DIAGNOSIS — L503 Dermatographic urticaria: Secondary | ICD-10-CM | POA: Diagnosis not present

## 2017-08-23 DIAGNOSIS — D75838 Other thrombocytosis: Secondary | ICD-10-CM

## 2017-08-23 NOTE — Progress Notes (Signed)
Here for follow up

## 2017-08-23 NOTE — Progress Notes (Signed)
Hematology/Oncology Consult note [facility]  Telephone:([phone] [phone]  Patient Care Team: Leone Haven, MD as PCP - General (Family Medicine)   Name of the patient: Melinda Holder  [id number]  1971-06-17   Date of visit: 08/23/17  Diagnosis- 1. 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 Iron deficiency anemia likely due to  menorrhagia  Chief complaint/ Reason for visit- routine f/u  Heme/Onc history: patient is a 46 year old female with a history of iron deficiency anemia, constipation pruritus who has been referred to [place] for evaluation of thrombocytosis. Recent CBC from 06/04/2017 showed white count of 6.1, H&H of 10.7/33.1 with an MCV of 80.8 and a platelet count of 594. Iron studies showed low ferritin of 4.9, low serum iron of 23 and iron saturation of 5%. TSH and LFTs were within normal limits. One year ago her H&H was 10.8/32.6 and platelet count was 529. CBC from February 2014 was normal including a platelet count of 414.  Patient does report having menstrual bleeding. Her menstrual cycles lasts for 5 days for the first 2-3 days a particularly heavy and she needs to change her son to 3 pads every hour or so. She has seen gynecology in the past and was recommended birth control pills but after going through the side effects she did not wish to take them. Denies any consistent use of NSAIDs. Occasionally takes them once a week for headache. Denies any bright red blood in stools, dark tarry stools or blood loss in her stool or urine. Denies any nosebleeds or gum bleeds. Reports some fatigue. Denies other complaints. No family history of colon cancer. Her appetite is good and she denies any unintentional weight loss.  Results of blood work from 06/21/2017 were as follows: CBC showed white count of 6.2, H&H of 11.2/34.3 and a platelet count of 492. Review of peripheral smear showed mild thrombocytosis. RBC showed  anisocytosis leukocytes are unremarkable. ESR was normal at 11. Jak 2 testing was negative.  Subsequent CBC on 06/30/2017 showed a normal platelet count of 420. H&H was still low at 10.1 and 30.6.  CBC on 08/20/2017 showed white count of 6.9, H&H of 13.7/40.9 and a platelet count of 362. Iron studies were within normal limits. Ferritin increased to 85 from 4.9.  Interval history- States that her periods are no longer heavy since she started taking vitamin D. She is yet to see gyn. Denies any blood loss in stools. Still feels fatigued  ECOG PS- 0 Pain scale- 0  Review of systems- Review of Systems  Constitutional: Negative for chills, fever, malaise/fatigue and weight loss.  HENT: Negative for congestion, ear discharge and nosebleeds.   Eyes: Negative for blurred vision.  Respiratory: Negative for cough, hemoptysis, sputum production, shortness of breath and wheezing.   Cardiovascular: Negative for chest pain, palpitations, orthopnea and claudication.  Gastrointestinal: Negative for abdominal pain, blood in stool, constipation, diarrhea, heartburn, melena, nausea and vomiting.  Genitourinary: Negative for dysuria, flank pain, frequency, hematuria and urgency.  Musculoskeletal: Negative for back pain, joint pain and myalgias.  Skin: Negative for rash.  Neurological: Negative for dizziness, tingling, focal weakness, seizures, weakness and headaches.  Endo/Heme/Allergies: Does not bruise/bleed easily.  Psychiatric/Behavioral: Negative for depression and suicidal ideas. The patient does not have insomnia.      Current treatment- 2 doses of IV iron  No Known Allergies   Past Medical History:  Diagnosis Date  . Amblyopia of eye, right   . Asthma   .  Endometriosis      Past Surgical History:  Procedure Laterality Date  . ABLATION ON ENDOMETRIOSIS    . CLAVICLE SURGERY Left 10/2016  . EYE MUSCLE SURGERY Right    child  . ORIF CLAVICULAR FRACTURE Left 11/03/2016   Procedure: OPEN  REDUCTION INTERNAL FIXATION (ORIF) CLAVICULAR FRACTURE;  Surgeon: Corky Mull, MD;  Location: [facility] ORS;  Service: Orthopedics;  Laterality: Left;  . removal of fibroid      Social History   Social History  . Marital status: Married    Spouse name: N/A  . Number of children: N/A  . Years of education: N/A   Occupational History  . Not on file.   Social History Main Topics  . Smoking status: Former Smoker    Packs/day: 1.00    Years: 15.00    Types: Cigarettes    Quit date: 02/21/2015  . Smokeless tobacco: Never Used  . Alcohol use 0.0 oz/week     Comment: Occasional   . Drug use: No  . Sexual activity: Yes    Partners: Male     Comment: 1 female   Other Topics Concern  . 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 Lupus Mother   . Hypertension Mother   . Hypertension Father   . Diabetes Father   . Cancer Paternal Aunt        Breast Cancer  . Cancer Paternal Aunt        Lung Cancer  . Cancer Other   . Breast cancer Neg Hx      Current Outpatient Prescriptions:  .  dicyclomine (BENTYL) 10 MG capsule, Take 10 mg by mouth every 6 (six) hours as needed for pain., Disp: , Rfl:  .  Glucosamine-Chondroit-Vit C-Mn (GLUCOSAMINE 1500 COMPLEX PO), Take by mouth., Disp: , Rfl:  .  hydrOXYzine (ATARAX/VISTARIL) 10 MG tablet, TAKE 1 TABLET (10 MG TOTAL) BY MOUTH AT BEDTIME., Disp: 30 tablet, Rfl: 5 .  ibuprofen (ADVIL,MOTRIN) 200 MG tablet, Take 400 mg by mouth every 6 (six) hours as needed for headache or moderate pain., Disp: , Rfl:  .  norethindrone (MICRONOR,CAMILA,ERRIN) 0.35 MG tablet, Take 1 tablet (0.35 mg total) by mouth daily. (Patient not taking: Reported on 06/21/2017), Disp: 1 Package, Rfl: 11 .  ondansetron (ZOFRAN) 4 MG tablet, Take 4 mg by mouth every 6 (six) hours as  needed for nausea/vomiting., Disp: , Rfl:  .  oxyCODONE (ROXICODONE) 5 MG immediate release tablet, Take 1-2 tablets (5-10 mg total) by mouth every 4 (four) hours as needed for severe pain. (Patient not taking: Reported on 06/30/2017), Disp: 50 tablet, Rfl: 0 .  Turmeric 500 MG CAPS, Take 1 tablet by mouth daily., Disp: , Rfl:   Physical exam:  Vitals:   08/23/17 0831  BP: 122/77  Pulse: 86  Resp: 18  Temp: 98.8 F (37.1 C)  TempSrc: Tympanic  Weight: 152 lb 1.9 oz (69 kg)   Physical Exam  Constitutional: She is oriented to person, place, and time and well-developed, well-nourished, and in no distress.  HENT:  Head: Normocephalic and atraumatic.  Eyes: Pupils are equal, round, and reactive to light. EOM are normal.  Neck: Normal range of motion.  Cardiovascular:  Normal rate, regular rhythm and normal heart sounds.   Pulmonary/Chest: Effort normal and breath sounds normal.  Abdominal: Soft. Bowel sounds are normal.  Neurological: She is alert and oriented to person, place, and time.  Skin: Skin is warm and dry.     CMP Latest Ref Rng & Units 07/02/2017  Glucose 65 - 99 mg/dL 106(H)  BUN 6 - 20 mg/dL <5(L)  Creatinine 0.44 - 1.00 mg/dL 0.50  Sodium 135 - 145 mmol/L 138  Potassium 3.5 - 5.1 mmol/L 3.8  Chloride 101 - 111 mmol/L 108  CO2 22 - 32 mmol/L 27  Calcium 8.9 - 10.3 mg/dL 8.2(L)  Total Protein 6.5 - 8.1 g/dL -  Total Bilirubin 0.3 - 1.2 mg/dL -  Alkaline Phos 38 - 126 U/L -  AST 15 - 41 U/L -  ALT 14 - 54 U/L -   CBC Latest Ref Rng & Units 08/20/2017  WBC 3.6 - 11.0 K/uL 6.9  Hemoglobin 12.0 - 16.0 g/dL 13.7  Hematocrit 35.0 - 47.0 % 40.9  Platelets 150 - 440 K/uL 362     Assessment and plan- Patient is a 46 y.o. female with following issues  1. Thrombocytosis- secondary to iron deficiency. Resolved after correcting iron deficiency. Discussed results of bloodwork as above  2. Iron deficiency anemia. H/H significantly improved after 2 doses of feraheme. Etiology  likely due to menorrhagia. If she continues to have iron deficiency despite correcting menorhagia, I will refer her to GI. Repeat cbc, iron studies, b12 and folate in 3 months. I will see her in 6 months with cbc, ferritin and iron studies   Visit Diagnosis 1. Iron deficiency anemia, unspecified iron deficiency anemia type   2. Secondary thrombocytosis      Dr. Randa Evens, MD, MPH [facility] at [facility] Pager- [phone] 08/23/2017 8:46 AM

## 2017-11-22 ENCOUNTER — Inpatient Hospital Stay: Payer: BLUE CROSS/BLUE SHIELD | Attending: Oncology

## 2017-11-26 ENCOUNTER — Inpatient Hospital Stay: Payer: BLUE CROSS/BLUE SHIELD | Attending: Oncology

## 2017-11-26 DIAGNOSIS — L299 Pruritus, unspecified: Secondary | ICD-10-CM | POA: Diagnosis not present

## 2017-11-26 DIAGNOSIS — R7989 Other specified abnormal findings of blood chemistry: Secondary | ICD-10-CM | POA: Diagnosis not present

## 2017-11-26 DIAGNOSIS — Z87891 Personal history of nicotine dependence: Secondary | ICD-10-CM | POA: Diagnosis not present

## 2017-11-26 DIAGNOSIS — J45909 Unspecified asthma, uncomplicated: Secondary | ICD-10-CM | POA: Insufficient documentation

## 2017-11-26 DIAGNOSIS — Z79899 Other long term (current) drug therapy: Secondary | ICD-10-CM | POA: Insufficient documentation

## 2017-11-26 DIAGNOSIS — K5900 Constipation, unspecified: Secondary | ICD-10-CM | POA: Diagnosis not present

## 2017-11-26 DIAGNOSIS — D509 Iron deficiency anemia, unspecified: Secondary | ICD-10-CM | POA: Insufficient documentation

## 2017-11-26 LAB — CBC WITH DIFFERENTIAL/PLATELET
BASOS ABS: 0 10*3/uL (ref 0–0.1)
BASOS PCT: 1 %
Eosinophils Absolute: 0.3 10*3/uL (ref 0–0.7)
Eosinophils Relative: 5 %
HEMATOCRIT: 39.7 % (ref 35.0–47.0)
HEMOGLOBIN: 13.4 g/dL (ref 12.0–16.0)
LYMPHS PCT: 24 %
Lymphs Abs: 1.6 10*3/uL (ref 1.0–3.6)
MCH: 31.8 pg (ref 26.0–34.0)
MCHC: 33.8 g/dL (ref 32.0–36.0)
MCV: 94 fL (ref 80.0–100.0)
MONO ABS: 0.5 10*3/uL (ref 0.2–0.9)
MONOS PCT: 7 %
NEUTROS ABS: 4.3 10*3/uL (ref 1.4–6.5)
NEUTROS PCT: 63 %
Platelets: 457 10*3/uL — ABNORMAL HIGH (ref 150–440)
RBC: 4.22 MIL/uL (ref 3.80–5.20)
RDW: 13.1 % (ref 11.5–14.5)
WBC: 6.7 10*3/uL (ref 3.6–11.0)

## 2017-11-26 LAB — FOLATE: FOLATE: 12.6 ng/mL (ref >5.9)

## 2017-11-26 LAB — IRON AND TIBC
IRON: 81 ug/dL (ref 28–170)
Saturation Ratios: 24 % (ref 10.4–31.8)
TIBC: 338 ug/dL (ref 250–450)
UIBC: 257 ug/dL

## 2017-11-26 LAB — FERRITIN: Ferritin: 15 ng/mL (ref 11–307)

## 2017-11-27 LAB — VITAMIN B12: Vitamin B-12: 403 pg/mL (ref 180–914)

## 2017-12-28 DIAGNOSIS — C50919 Malignant neoplasm of unspecified site of unspecified female breast: Secondary | ICD-10-CM

## 2017-12-28 HISTORY — DX: Malignant neoplasm of unspecified site of unspecified female breast: C50.919

## 2018-01-24 DIAGNOSIS — J069 Acute upper respiratory infection, unspecified: Secondary | ICD-10-CM | POA: Diagnosis not present

## 2018-02-07 DIAGNOSIS — J0100 Acute maxillary sinusitis, unspecified: Secondary | ICD-10-CM | POA: Diagnosis not present

## 2018-02-07 DIAGNOSIS — H9201 Otalgia, right ear: Secondary | ICD-10-CM | POA: Diagnosis not present

## 2018-02-18 ENCOUNTER — Other Ambulatory Visit: Payer: Self-pay | Admitting: Family Medicine

## 2018-02-18 NOTE — Telephone Encounter (Signed)
LOV: 05/20/17  Dr. Caryl Bis  Pharmacy: [facility] [place]

## 2018-02-18 NOTE — Telephone Encounter (Signed)
Copied from [place] [id number]. Topic: Quick Communication - Rx Refill/Question >> Feb 18, 2018 12:11 PM Clack, Laban Emperor wrote: Medication:  hydrOXYzine (ATARAX/VISTARIL) 10 MG tablet [[id number]]    Has the patient contacted their pharmacy? Yes.     (Agent: If no, request that the patient contact the pharmacy for the refill.)   Preferred Pharmacy (with phone number or street name): [id number] [place], [place] [address] [phone] (Phone) [phone] (Fax)     Agent: Please be advised that RX refills may take up to 3 business days. We ask that you follow-up with your pharmacy.

## 2018-02-21 ENCOUNTER — Inpatient Hospital Stay: Payer: BLUE CROSS/BLUE SHIELD | Attending: Oncology | Admitting: Oncology

## 2018-02-21 ENCOUNTER — Inpatient Hospital Stay: Payer: BLUE CROSS/BLUE SHIELD

## 2018-02-21 ENCOUNTER — Telehealth: Payer: Self-pay | Admitting: *Deleted

## 2018-02-21 ENCOUNTER — Other Ambulatory Visit: Payer: Self-pay | Admitting: *Deleted

## 2018-02-21 ENCOUNTER — Encounter: Payer: Self-pay | Admitting: Oncology

## 2018-02-21 VITALS — BP 123/82 | HR 71 | Temp 99.2°F | Resp 18 | Ht 62.0 in | Wt 151.9 lb

## 2018-02-21 DIAGNOSIS — D473 Essential (hemorrhagic) thrombocythemia: Secondary | ICD-10-CM

## 2018-02-21 DIAGNOSIS — R7989 Other specified abnormal findings of blood chemistry: Secondary | ICD-10-CM | POA: Diagnosis not present

## 2018-02-21 DIAGNOSIS — D509 Iron deficiency anemia, unspecified: Secondary | ICD-10-CM | POA: Diagnosis not present

## 2018-02-21 DIAGNOSIS — D75839 Thrombocytosis, unspecified: Secondary | ICD-10-CM

## 2018-02-21 LAB — CBC WITH DIFFERENTIAL/PLATELET
BASOS ABS: 0.1 10*3/uL (ref 0–0.1)
Basophils Relative: 1 %
Eosinophils Absolute: 0.2 10*3/uL (ref 0–0.7)
Eosinophils Relative: 3 %
HCT: 39.9 % (ref 35.0–47.0)
HEMOGLOBIN: 13.6 g/dL (ref 12.0–16.0)
LYMPHS ABS: 2.1 10*3/uL (ref 1.0–3.6)
LYMPHS PCT: 30 %
MCH: 31.3 pg (ref 26.0–34.0)
MCHC: 34.1 g/dL (ref 32.0–36.0)
MCV: 92 fL (ref 80.0–100.0)
Monocytes Absolute: 0.7 10*3/uL (ref 0.2–0.9)
Monocytes Relative: 11 %
NEUTROS ABS: 3.9 10*3/uL (ref 1.4–6.5)
NEUTROS PCT: 55 %
PLATELETS: 477 10*3/uL — AB (ref 150–440)
RBC: 4.33 MIL/uL (ref 3.80–5.20)
RDW: 13.6 % (ref 11.5–14.5)
WBC: 7 10*3/uL (ref 3.6–11.0)

## 2018-02-21 LAB — IRON AND TIBC
Iron: 94 ug/dL (ref 28–170)
Saturation Ratios: 25 % (ref 10.4–31.8)
TIBC: 383 ug/dL (ref 250–450)
UIBC: 290 ug/dL

## 2018-02-21 LAB — FERRITIN: FERRITIN: 7 ng/mL — AB (ref 11–307)

## 2018-02-21 NOTE — Telephone Encounter (Signed)
Called pt and let her know that ferritin is low and Dr. Janese Banks would like pt to have 2 more feraheme doses. Patient is agreeable to the infusions and we have set them up for 3/1 and 3/8 for 2:30. I also told her that since her levels are low Dr. Janese Banks had already spoke to her and she was agreeable to GI referral. The gi ref. Was made to [facility] in [place] location

## 2018-02-21 NOTE — Progress Notes (Signed)
Tired today and when she came 6 months ago, she was not tired. No blood in stool or urine.

## 2018-02-21 NOTE — Addendum Note (Signed)
Addended by: Randa Evens C on: 02/21/2018 12:21 PM   Modules accepted: Orders

## 2018-02-21 NOTE — Progress Notes (Signed)
Hematology/Oncology Consult note [facility]  Telephone:([phone] [phone]  Patient Care Team: Leone Haven, MD as PCP - General (Family Medicine)   Name of the patient: Melinda Holder  [id number]  1971/04/16   Date of visit: 02/21/18  Diagnosis- 1. Thrombocytosis likely reactive  2. Iron deficiency anemia likely due to  menorrhagia  Chief complaint/ Reason for visit- routine f/u of anemia and thrombocytosis  Heme/Onc history: patient is a 47 year old female with a history of iron deficiency anemia, constipation pruritus who has been referred to [place] for evaluation of thrombocytosis. Recent CBC from 06/04/2017 showed white count of 6.1, H&H of 10.7/33.1 with an MCV of 80.8 and a platelet count of 594. Iron studies showed low ferritin of 4.9, low serum iron of 23 and iron saturation of 5%. TSH and LFTs were within normal limits.One year ago her H&H was 10.8/32.6 and platelet count was 529. CBC from February 2014 was normal including a platelet count of 414.  Patient does report having menstrual bleeding. Her menstrual cycles lasts for 5 days for the first 2-3 days a particularly heavy and she needs to change her son to 3 pads every hour or so. She has seen gynecology in the past and was recommended birth control pills but after going through the side effects she did not wish to take them. Denies any consistent use of NSAIDs. Occasionally takes them once a week for headache. Denies any bright red blood in stools, dark tarry stools or blood loss in her stool or urine. Denies any nosebleeds or gum bleeds. Reports some fatigue. Denies other complaints. No family history of colon cancer. Her appetite is good and she denies any unintentional weight loss.  Results of blood work from 06/21/2017 were as follows: CBC showed white count of 6.2, H&H of 11.2/34.3 and a platelet count of 492. Review of peripheral smear showed mild thrombocytosis. RBC showed  anisocytosis leukocytes are unremarkable. ESR was normal at 11. Jak 2 testing was negative.  Subsequent CBC on 06/30/2017 showed a normal platelet count of 420. H&H was still low at 10.1 and 30.6.  CBC on 08/20/2017 showed white count of 6.9, H&H of 13.7/40.9 and a platelet count of 362. Iron studies were within normal limits. Ferritin increased to 85 from 4.9.   Interval history- reports that her menstrual cycles have not been heavy in the last 2 months. They last for 2-3 days. She still reports feeling fatigued. Denies any weight loss. In fact she has gained weight. Denies any blood loss in stools. No dark tarry stools or melanotic stools  ECOG PS- 0 Pain scale- 0   Review of systems- Review of Systems  Constitutional: Negative for chills, fever, malaise/fatigue and weight loss.  HENT: Negative for congestion, ear discharge and nosebleeds.   Eyes: Negative for blurred vision.  Respiratory: Negative for cough, hemoptysis, sputum production, shortness of breath and wheezing.   Cardiovascular: Negative for chest pain, palpitations, orthopnea and claudication.  Gastrointestinal: Negative for abdominal pain, blood in stool, constipation, diarrhea, heartburn, melena, nausea and vomiting.  Genitourinary: Negative for dysuria, flank pain, frequency, hematuria and urgency.  Musculoskeletal: Negative for back pain, joint pain and myalgias.  Skin: Negative for rash.  Neurological: Negative for dizziness, tingling, focal weakness, seizures, weakness and headaches.  Endo/Heme/Allergies: Does not bruise/bleed easily.  Psychiatric/Behavioral: Negative for depression and suicidal ideas. The patient does not have insomnia.       No Known Allergies   Past Medical History:  Diagnosis Date  . Amblyopia of eye, right   . Asthma   . Endometriosis      Past Surgical History:  Procedure Laterality Date  . ABLATION ON ENDOMETRIOSIS    . CLAVICLE SURGERY Left 10/2016  . EYE MUSCLE SURGERY Right     child  . ORIF CLAVICULAR FRACTURE Left 11/03/2016   Procedure: OPEN REDUCTION INTERNAL FIXATION (ORIF) CLAVICULAR FRACTURE;  Surgeon: Corky Mull, MD;  Location: [facility] ORS;  Service: Orthopedics;  Laterality: Left;  . removal of fibroid      Social History   Socioeconomic History  . Marital status: Married    Spouse name: Not on file  . Number of children: Not on file  . Years of education: Not on file  . Highest education level: Not on file  Social Needs  . Financial resource strain: Not on file  . Food insecurity - worry: Not on file  . Food insecurity - inability: Not on file  . Transportation needs - medical: Not on file  . Transportation needs - non-medical: Not on file  Occupational History  . Not on file  Tobacco Use  . Smoking status: Former Smoker    Packs/day: 1.00    Years: 15.00    Pack years: 15.00    Types: Cigarettes    Last attempt to quit: 02/21/2015    Years since quitting: 3.0  . Smokeless tobacco: Never Used  Substance and Sexual Activity  . Alcohol use: Yes    Alcohol/week: 0.0 oz    Comment: Occasional   . Drug use: No  . Sexual activity: Yes    Partners: Male    Comment: 1 female  Other Topics Concern  . 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 Lupus Mother   . Hypertension Mother   . Hypertension Father   . Diabetes Father   . Cancer Paternal Aunt        Breast Cancer  . Cancer Paternal Aunt        Lung Cancer  . Cancer Other   . Breast cancer Neg Hx      Current Outpatient Medications:  .  dicyclomine (BENTYL) 10 MG capsule, Take 10 mg by mouth every 6 (six) hours as needed for pain., Disp: , Rfl:  .  Glucosamine-Chondroit-Vit C-Mn (GLUCOSAMINE 1500 COMPLEX PO), Take by mouth., Disp: , Rfl:  .  hydrOXYzine (ATARAX/VISTARIL) 10 MG  tablet, TAKE 1 TABLET (10 MG TOTAL) BY MOUTH AT BEDTIME., Disp: 30 tablet, Rfl: 5 .  ibuprofen (ADVIL,MOTRIN) 200 MG tablet, Take 400 mg by mouth every 6 (six) hours as needed for headache or moderate pain., Disp: , Rfl:  .  norethindrone (MICRONOR,CAMILA,ERRIN) 0.35 MG tablet, Take 1 tablet (0.35 mg total) by mouth daily. (Patient not taking: Reported on 06/21/2017), Disp: 1 Package, Rfl: 11 .  ondansetron (ZOFRAN) 4 MG tablet, Take 4 mg by mouth every 6 (six) hours as needed for nausea/vomiting., Disp: , Rfl:  .  oxyCODONE (ROXICODONE) 5 MG immediate release tablet, Take 1-2 tablets (5-10 mg total) by mouth every 4 (four) hours as needed for severe pain. (Patient not taking: Reported on 08/23/2017), Disp: 50 tablet, Rfl: 0 .  Turmeric 500 MG CAPS, Take  1 tablet by mouth daily., Disp: , Rfl:   Physical exam:  Vitals:   02/21/18 0834  BP: 123/82  Pulse: 71  Resp: 18  Temp: 99.2 F (37.3 C)  TempSrc: Tympanic  Weight: 151 lb 14.4 oz (68.9 kg)  Height: _0  (1.575 m)   Physical Exam  Constitutional: She is oriented to person, place, and time and well-developed, well-nourished, and in no distress.  HENT:  Head: Normocephalic and atraumatic.  Eyes: EOM are normal. Pupils are equal, round, and reactive to light.  Neck: Normal range of motion.  Cardiovascular: Normal rate, regular rhythm and normal heart sounds.  Pulmonary/Chest: Effort normal and breath sounds normal.  Abdominal: Soft. Bowel sounds are normal.  Neurological: She is alert and oriented to person, place, and time.  Skin: Skin is warm and dry.     CMP Latest Ref Rng & Units 07/02/2017  Glucose 65 - 99 mg/dL 106(H)  BUN 6 - 20 mg/dL <5(L)  Creatinine 0.44 - 1.00 mg/dL 0.50  Sodium 135 - 145 mmol/L 138  Potassium 3.5 - 5.1 mmol/L 3.8  Chloride 101 - 111 mmol/L 108  CO2 22 - 32 mmol/L 27  Calcium 8.9 - 10.3 mg/dL 8.2(L)  Total Protein 6.5 - 8.1 g/dL -  Total Bilirubin 0.3 - 1.2 mg/dL -  Alkaline Phos 38 - 126 U/L -  AST  15 - 41 U/L -  ALT 14 - 54 U/L -   CBC Latest Ref Rng & Units 02/21/2018  WBC 3.6 - 11.0 K/uL 7.0  Hemoglobin 12.0 - 16.0 g/dL 13.6  Hematocrit 35.0 - 47.0 % 39.9  Platelets 150 - 440 K/uL 477(H)    Assessment and plan- Patient is a 47 y.o. female with following issues:  1. Iron deficiency: she is again not anemic today. Iron studies from today are pending. If iron studies come back low- I will refer her to [facility].   2. Thrombocytosis- Platelet counts were in the 500's in 2017 when she was anemic. Normalized transiently in July and august 2018 and then back up in the 400's now. JAK 2 mutation testing negative. No h/o thrombosis. Given that she is <60 with no thrombosis, bone marrow biopsy will not change management even if she were to have MPN. Also she has waxing and waning platelet counts and likelihood of MPN is low at this time  Repeat cbc ferritin and iron studies and b12 in 3 and 6 months. I will see her back in 6 months. If platelet ounts continue to trend up despite normal iron studies- will consider bcr abl and calr and mpl testing     Visit Diagnosis 1. Iron deficiency anemia, unspecified iron deficiency anemia type   2. Thrombocytosis ([place])      Dr. Randa Evens, MD, MPH [facility] at [facility] Pager- [phone] 02/21/2018 9:13 AM

## 2018-02-22 ENCOUNTER — Telehealth: Payer: Self-pay

## 2018-02-22 MED ORDER — HYDROXYZINE HCL 10 MG PO TABS: ORAL_TABLET | ORAL | 5 refills | Status: DC

## 2018-02-22 NOTE — Telephone Encounter (Signed)
Patient was contacted about a GI referral with [facility]. The patient verbalized understanding and agreed to attend appointment on 04/07/18 at 2:00 PM. cbg

## 2018-02-25 ENCOUNTER — Inpatient Hospital Stay: Payer: BLUE CROSS/BLUE SHIELD | Attending: Oncology

## 2018-02-25 VITALS — BP 117/79 | HR 82 | Temp 99.2°F | Resp 18

## 2018-02-25 DIAGNOSIS — D509 Iron deficiency anemia, unspecified: Secondary | ICD-10-CM | POA: Diagnosis not present

## 2018-02-25 MED ORDER — SODIUM CHLORIDE 0.9 % IV SOLN
Freq: Once | INTRAVENOUS | Status: AC
  Administered 2018-02-25: 15:00:00 via INTRAVENOUS
  Filled 2018-02-25: qty 1000

## 2018-02-25 MED ORDER — SODIUM CHLORIDE 0.9 % IV SOLN
510.0000 mg | Freq: Once | INTRAVENOUS | Status: AC
  Administered 2018-02-25: 510 mg via INTRAVENOUS
  Filled 2018-02-25: qty 17

## 2018-02-25 MED ORDER — FERUMOXYTOL INJECTION 510 MG/17 ML
INTRAVENOUS | Status: AC
  Filled 2018-02-25: qty 17

## 2018-02-25 NOTE — Patient Instructions (Signed)

## 2018-03-04 ENCOUNTER — Inpatient Hospital Stay: Payer: BLUE CROSS/BLUE SHIELD

## 2018-03-04 VITALS — BP 112/73 | HR 76 | Temp 99.0°F | Resp 18

## 2018-03-04 DIAGNOSIS — D509 Iron deficiency anemia, unspecified: Secondary | ICD-10-CM

## 2018-03-04 MED ORDER — SODIUM CHLORIDE 0.9 % IV SOLN
Freq: Once | INTRAVENOUS | Status: AC
  Administered 2018-03-04: 15:00:00 via INTRAVENOUS
  Filled 2018-03-04: qty 1000

## 2018-03-04 MED ORDER — SODIUM CHLORIDE 0.9 % IV SOLN
510.0000 mg | Freq: Once | INTRAVENOUS | Status: AC
  Administered 2018-03-04: 510 mg via INTRAVENOUS
  Filled 2018-03-04: qty 17

## 2018-04-04 DIAGNOSIS — J069 Acute upper respiratory infection, unspecified: Secondary | ICD-10-CM | POA: Diagnosis not present

## 2018-04-06 ENCOUNTER — Encounter: Payer: Self-pay | Admitting: Family

## 2018-04-06 ENCOUNTER — Ambulatory Visit (INDEPENDENT_AMBULATORY_CARE_PROVIDER_SITE_OTHER): Payer: BLUE CROSS/BLUE SHIELD | Admitting: Family

## 2018-04-06 VITALS — BP 120/88 | HR 97 | Temp 98.2°F | Wt 152.5 lb

## 2018-04-06 DIAGNOSIS — H109 Unspecified conjunctivitis: Secondary | ICD-10-CM

## 2018-04-06 DIAGNOSIS — J40 Bronchitis, not specified as acute or chronic: Secondary | ICD-10-CM | POA: Diagnosis not present

## 2018-04-06 DIAGNOSIS — B9689 Other specified bacterial agents as the cause of diseases classified elsewhere: Secondary | ICD-10-CM

## 2018-04-06 MED ORDER — AZITHROMYCIN 250 MG PO TABS
ORAL_TABLET | ORAL | 0 refills | Status: DC
Start: 2018-04-06 — End: 2018-05-30

## 2018-04-06 MED ORDER — ALBUTEROL SULFATE HFA 108 (90 BASE) MCG/ACT IN AERS: 2.0000 | INHALATION_SPRAY | Freq: Four times a day (QID) | RESPIRATORY_TRACT | 0 refills | Status: DC | PRN

## 2018-04-06 MED ORDER — ERYTHROMYCIN 5 MG/GM OP OINT: TOPICAL_OINTMENT | OPHTHALMIC | 0 refills | Status: DC

## 2018-04-06 MED ORDER — HYDROCODONE-HOMATROPINE 5-1.5 MG/5ML PO SYRP: 5.0000 mL | ORAL_SOLUTION | Freq: Every evening | ORAL | 0 refills | Status: DC | PRN

## 2018-04-06 NOTE — Patient Instructions (Addendum)
Rest is the most important as discussed  Reasonable to wait on starting zpak for another day or 2 and see if you round the corner  [place] of water  mucinex  Please take cough medication at night only as needed. As we discussed, I do not recommend dosing throughout the day as coughing is a protective mechanism . It also helps to break up thick mucous.  Do not take cough suppressants with alcohol as can lead to trouble breathing. Advise caution if taking cough suppressant and operating machinery ( i.e driving a car) as you may feel very tired.    Use albuterol every 6 hours for first 24 hours to get good medication into the lungs and loosen congestion; after, you may use as needed and eventually stop all together when cough resolves.   Let me know if not better   Bacterial Conjunctivitis Bacterial conjunctivitis is an infection of the clear membrane that covers the white part of your eye and the inner surface of your eyelid (conjunctiva). When the blood vessels in your conjunctiva become inflamed, your eye becomes red or pink, and it will probably feel itchy. Bacterial conjunctivitis spreads very easily from person to person (is contagious). It also spreads easily from one eye to the other eye. What are the causes? This condition is caused by several common bacteria. You may get the infection if you come into close contact with another person who is infected. You may also come into contact with items that are contaminated with the bacteria, such as a face towel, contact lens solution, or eye makeup. What increases the risk? This condition is more likely to develop in people who:  Are exposed to other people who have the infection.  Wear contact lenses.  Have a sinus infection.  Have had a recent eye injury or surgery.  Have a weak body defense system (immune system).  Have a medical condition that causes dry eyes.  What are the signs or symptoms? Symptoms of this condition  include:  Eye redness.  Tearing or watery eyes.  Itchy eyes.  Burning feeling in your eyes.  Thick, yellowish discharge from an eye. This may turn into a crust on the eyelid overnight and cause your eyelids to stick together.  Swollen eyelids.  Blurred vision.  How is this diagnosed? Your health care provider can diagnose this condition based on your symptoms and medical history. Your health care provider may also take a sample of discharge from your eye to find the cause of your infection. This is rarely done. How is this treated? Treatment for this condition includes:  Antibiotic eye drops or ointment to clear the infection more quickly and prevent the spread of infection to others.  Oral antibiotic medicines to treat infections that do not respond to drops or ointments, or last longer than 10 days.  Cool, wet cloths (cool compresses) placed on the eyes.  Artificial tears applied 2-6 times a day.  Follow these instructions at home: Medicines  Take or apply your antibiotic medicine as told by your health care provider. Do not stop taking or applying the antibiotic even if you start to feel better.  Take or apply over-the-counter and prescription medicines only as told by your health care provider.  Be very careful to avoid touching the edge of your eyelid with the eye drop bottle or the ointment tube when you apply medicines to the affected eye. This will keep you from spreading the infection to your other eye or to  other people. Managing discomfort  Gently wipe away any drainage from your eye with a warm, wet washcloth or a cotton ball.  Apply a cool, clean washcloth to your eye for 10-20 minutes, 3-4 times a day. General instructions  Do not wear contact lenses until the inflammation is gone and your health care provider says it is safe to wear them again. Ask your health care provider how to sterilize or replace your contact lenses before you use them again. Wear  glasses until you can resume wearing contacts.  Avoid wearing eye makeup until the inflammation is gone. Throw away any old eye cosmetics that may be contaminated.  Change or wash your pillowcase every day.  Do not share towels or washcloths. This may spread the infection.  Wash your hands often with soap and water. Use paper towels to dry your hands.  Avoid touching or rubbing your eyes.  Do not drive or use heavy machinery if your vision is blurred. Contact a health care provider if:  You have a fever.  Your symptoms do not get better after 10 days. Get help right away if:  You have a fever and your symptoms suddenly get worse.  You have severe pain when you move your eye.  You have facial pain, redness, or swelling.  You have sudden loss of vision. This information is not intended to replace advice given to you by your health care provider. Make sure you discuss any questions you have with your health care provider. Document Released: 12/14/2005 Document Revised: 04/23/2016 Document Reviewed: 09/26/2015 Elsevier Interactive Patient Education  2017 [employer].

## 2018-04-06 NOTE — Progress Notes (Signed)
Subjective:    Patient ID: Melinda Holder, female    DOB: [birth year], 47 y.o.   MRN: [id number]  CC: Melinda Holder is a 47 y.o. female who presents today for an acute visit.    HPI: CC: red eye in left eye x 3 days, unchanged. Now in right eye. Eyes are crusty. Doesn't wear contacts. Eyes feel dry. No eye pain, visual disturbances, foreign body sensation. Tried allergy eye drops with no improvement   Endorses cough 6 days, unchanged. Cough worse at night. Endorses tactile warmth, chills- both resolved at this time. .  Tried nyquil cold and cough, no relief. Feels wheeze at night. No sob.   Endorses sinus congestion, ear pressure ( since resolved).   Former smoker  H/o asthma; hasb't used inhalers as adult  3 days ago went to [facility], given tessalon perles.      HISTORY:  Past Medical History:  Diagnosis Date  . Amblyopia of eye, right   . Anemia   . Asthma   . Endometriosis    Past Surgical History:  Procedure Laterality Date  . ABLATION ON ENDOMETRIOSIS    . CLAVICLE SURGERY Left 10/2016  . EYE MUSCLE SURGERY Right    child  . ORIF CLAVICULAR FRACTURE Left 11/03/2016   Procedure: OPEN REDUCTION INTERNAL FIXATION (ORIF) CLAVICULAR FRACTURE;  Surgeon: Corky Mull, MD;  Location: [facility] ORS;  Service: Orthopedics;  Laterality: Left;  . removal of fibroid     Family History  Problem Relation Age of Onset  . Lupus Mother   . Hypertension Mother   . Hypertension Father   . Diabetes Father   . Cancer Paternal Aunt        Breast Cancer  . Cancer Paternal Aunt        Lung Cancer  . Cancer Other   . Breast cancer Neg Hx     Allergies: Patient has no known allergies. Current Outpatient Medications on File Prior to Visit  Medication Sig Dispense Refill  . hydrOXYzine (ATARAX/VISTARIL) 10 MG tablet TAKE 1 TABLET (10 MG TOTAL) BY MOUTH AT BEDTIME. 30 tablet 5  . ibuprofen (ADVIL,MOTRIN) 200 MG tablet Take 400 mg by mouth every 6 (six) hours as needed for  headache or moderate pain.    Marland Kitchen dicyclomine (BENTYL) 10 MG capsule Take 10 mg by mouth every 6 (six) hours as needed for pain.    . Glucosamine-Chondroit-Vit C-Mn (GLUCOSAMINE 1500 COMPLEX PO) Take by mouth.    . norethindrone (MICRONOR,CAMILA,ERRIN) 0.35 MG tablet Take 1 tablet (0.35 mg total) by mouth daily. (Patient not taking: Reported on 06/21/2017) 1 Package 11  . ondansetron (ZOFRAN) 4 MG tablet Take 4 mg by mouth every 6 (six) hours as needed for nausea/vomiting.    Marland Kitchen oxyCODONE (ROXICODONE) 5 MG immediate release tablet Take 1-2 tablets (5-10 mg total) by mouth every 4 (four) hours as needed for severe pain. (Patient not taking: Reported on 08/23/2017) 50 tablet 0   No current facility-administered medications on file prior to visit.     Social History   Tobacco Use  . Smoking status: Former Smoker    Packs/day: 1.00    Years: 15.00    Pack years: 15.00    Types: Cigarettes    Last attempt to quit: 02/21/2015    Years since quitting: 3.1  . Smokeless tobacco: Never Used  Substance Use Topics  . Alcohol use: Yes    Alcohol/week: 0.0 oz    Comment: Occasional   .  Drug use: No    Review of Systems  Constitutional: Negative for chills (resolved) and fever.  HENT: Positive for congestion, ear pain and sinus pain. Negative for sore throat and trouble swallowing.   Eyes: Positive for discharge and redness. Negative for photophobia, pain, itching and visual disturbance.  Respiratory: Positive for cough and wheezing. Negative for shortness of breath.   Cardiovascular: Negative for chest pain and palpitations.  Gastrointestinal: Negative for nausea and vomiting.      Objective:    BP 120/88 (BP Location: Left Arm, Patient Position: Sitting, Cuff Size: Normal)   Pulse 97   Temp 98.2 F (36.8 C) (Oral)   Wt 152 lb 8 oz (69.2 kg)   SpO2 97%   BMI 27.89 kg/m    Physical Exam  Constitutional: She appears well-developed and well-nourished.  HENT:  Head: Normocephalic and  atraumatic.  Right Ear: Hearing, tympanic membrane, external ear and ear canal normal. No drainage, swelling or tenderness. No foreign bodies. Tympanic membrane is not erythematous and not bulging. No middle ear effusion. No decreased hearing is noted.  Left Ear: Hearing, tympanic membrane, external ear and ear canal normal. No drainage, swelling or tenderness. No foreign bodies. Tympanic membrane is not erythematous and not bulging.  No middle ear effusion. No decreased hearing is noted.  Nose: Nose normal. No rhinorrhea. Right sinus exhibits no maxillary sinus tenderness and no frontal sinus tenderness. Left sinus exhibits no maxillary sinus tenderness and no frontal sinus tenderness.  Mouth/Throat: Uvula is midline, oropharynx is clear and moist and mucous membranes are normal. No oropharyngeal exudate, posterior oropharyngeal edema, posterior oropharyngeal erythema or tonsillar abscesses.  Eyes: Pupils are equal, round, and reactive to light. EOM are normal. Lids are everted and swept, no foreign bodies found. Right eye exhibits no discharge. Left eye exhibits no discharge and no hordeolum. Right conjunctiva is injected. Right conjunctiva has no hemorrhage. Left conjunctiva is injected. Left conjunctiva has no hemorrhage. No scleral icterus.  No external eye lesions. Surrounding skin intact.   Right eye:   Diffuse injection of the conjunctiva. No white spots, opacity, or foreign body appreciated. No collection of blood or pus in the anterior chamber. No ciliary flush surrounding iris.   No photophobia or eye pain appreciated during exam.   Left eye:   Diffuse injection of the conjunctiva. No white spots, opacity, or foreign body appreciated. No collection of blood or pus in the anterior chamber. No ciliary flush surrounding iris.   No photophobia or eye pain appreciated during exam.   Cardiovascular: Regular rhythm, normal heart sounds and normal pulses.  Pulmonary/Chest: Effort normal and  breath sounds normal. She has no wheezes. She has no rhonchi. She has no rales.  Lymphadenopathy:       Head (right side): No submental, no submandibular, no tonsillar, no preauricular, no posterior auricular and no occipital adenopathy present.       Head (left side): No submental, no submandibular, no tonsillar, no preauricular, no posterior auricular and no occipital adenopathy present.    She has no cervical adenopathy.  Neurological: She is alert.  Skin: Skin is warm and dry.  Psychiatric: She has a normal mood and affect. Her speech is normal and behavior is normal. Thought content normal.  Vitals reviewed.      Assessment & Plan:   1. Bacterial conjunctivitis of both eyes Symptoms consistent with bacterial conjunctivitis.  Patient is not a contact lens wearer.  We will start erythromycin ophthalmic.  Patient let me know  if not better.  Advised her how contagious this is and advised to be out of work the next couple days. - erythromycin ophthalmic ointment; Use one half inch four times daily to affected eye (s) x 7 days.  Dispense: 3.5 g; Refill: 0  2. Bronchitis Patient is well-appearing in no acute distress.  She is afebrile.  She has history of smoking, asthma.  Given her an albuterol inhaler today however no wheezes appreciated on exam.  Also advised her to start Mucinex, cough medication at bedtime advised she may hold another one to 2 days to see if symptoms improve with conservative measures.  If no improvement, advised to go ahead and start a Z-Pak.  Return precautions given.  - azithromycin (ZITHROMAX) 250 MG tablet; Take 2 tablets ( total 500 mg) PO on day 1, then take 1 tablet ( total 250 mg) by mouth q24h x 4 days.  Dispense: 6 tablet; Refill: 0 - HYDROcodone-homatropine (HYCODAN) 5-1.5 MG/5ML syrup; Take 5 mLs by mouth at bedtime as needed for cough.  Dispense: 30 mL; Refill: 0 - albuterol (PROVENTIL HFA;VENTOLIN HFA) 108 (90 Base) MCG/ACT inhaler; Inhale 2 puffs into the  lungs every 6 (six) hours as needed for wheezing or shortness of breath.  Dispense: 1 Inhaler; Refill: 0    I have discontinued Oluwateniola S. Loja's Turmeric. I am also having her start on azithromycin, HYDROcodone-homatropine, erythromycin, and albuterol. Additionally, I am having her maintain her ibuprofen, oxyCODONE, norethindrone, Glucosamine-Chondroit-Vit C-Mn (GLUCOSAMINE 1500 COMPLEX PO), ondansetron, dicyclomine, and hydrOXYzine.   Meds ordered this encounter  Medications  . azithromycin (ZITHROMAX) 250 MG tablet    Sig: Take 2 tablets ( total 500 mg) PO on day 1, then take 1 tablet ( total 250 mg) by mouth q24h x 4 days.    Dispense:  6 tablet    Refill:  0    Order Specific Question:   Supervising Provider    Answer:   Derrel Nip, TERESA L [[id number]]  . HYDROcodone-homatropine (HYCODAN) 5-1.5 MG/5ML syrup    Sig: Take 5 mLs by mouth at bedtime as needed for cough.    Dispense:  30 mL    Refill:  0    Order Specific Question:   Supervising Provider    Answer:   Derrel Nip, TERESA L [[id number]]  . erythromycin ophthalmic ointment    Sig: Use one half inch four times daily to affected eye (s) x 7 days.    Dispense:  3.5 g    Refill:  0    Order Specific Question:   Supervising Provider    Answer:   Deborra Medina L [[id number]]  . albuterol (PROVENTIL HFA;VENTOLIN HFA) 108 (90 Base) MCG/ACT inhaler    Sig: Inhale 2 puffs into the lungs every 6 (six) hours as needed for wheezing or shortness of breath.    Dispense:  1 Inhaler    Refill:  0    Order Specific Question:   Supervising Provider    Answer:   Crecencio Mc [[id number]]    Return precautions given.   Risks, benefits, and alternatives of the medications and treatment plan prescribed today were discussed, and patient expressed understanding.   Education regarding symptom management and diagnosis given to patient on AVS.  Continue to follow with Leone Haven, MD for routine health maintenance.   Melinda Holder and I agreed with plan.     Mable Paris, FNP

## 2018-04-07 ENCOUNTER — Ambulatory Visit: Payer: BLUE CROSS/BLUE SHIELD | Admitting: Gastroenterology

## 2018-04-28 ENCOUNTER — Other Ambulatory Visit: Payer: Self-pay | Admitting: Family

## 2018-04-28 DIAGNOSIS — J40 Bronchitis, not specified as acute or chronic: Secondary | ICD-10-CM

## 2018-04-29 NOTE — Telephone Encounter (Signed)
cma urged patient to go to urgent care. She states she would go to UC if worsening. Will refill inhaler.  She is not better and I would like her to be seen again. I saw her a month ago.   Please set up an appt with me on PCP

## 2018-04-29 NOTE — Telephone Encounter (Signed)
Last office visit 04/06/18 acute bronchitis  Spoke with patient still coughing using inhaler , feels like she is wheezing, not much better hasn't taken temperature.  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  Patient stated that she would prefer to come to our office to be seen.  I advised her that she should go to urgent care to be evaulated to may need Prednisone or nebulizer treatment per Joycelyn Schmid, [place]. She stated that if she got worse over the weekend she would. Again I advised her again to seek treatment at urgent care due to history of asthma.  L/f 04/06/18

## 2018-05-02 ENCOUNTER — Telehealth: Payer: Self-pay | Admitting: Family

## 2018-05-02 NOTE — Telephone Encounter (Signed)
Called to clarify sxs  She is using inhaler at bedtime which is helping. Not sob. No fever. She will go to UC over weekend if symptoms worsen. Advised to give [place] a call on Monday to let us know how she is doing

## 2018-05-02 NOTE — Telephone Encounter (Signed)
Patient is scheduled   

## 2018-05-20 ENCOUNTER — Inpatient Hospital Stay: Payer: BLUE CROSS/BLUE SHIELD

## 2018-05-27 ENCOUNTER — Inpatient Hospital Stay: Payer: BLUE CROSS/BLUE SHIELD | Attending: Oncology

## 2018-05-27 DIAGNOSIS — D509 Iron deficiency anemia, unspecified: Secondary | ICD-10-CM | POA: Insufficient documentation

## 2018-05-27 LAB — CBC
HCT: 41.6 % (ref 35.0–47.0)
Hemoglobin: 14.3 g/dL (ref 12.0–16.0)
MCH: 32 pg (ref 26.0–34.0)
MCHC: 34.4 g/dL (ref 32.0–36.0)
MCV: 93.1 fL (ref 80.0–100.0)
PLATELETS: 396 10*3/uL (ref 150–440)
RBC: 4.47 MIL/uL (ref 3.80–5.20)
RDW: 14 % (ref 11.5–14.5)
WBC: 5.6 10*3/uL (ref 3.6–11.0)

## 2018-05-27 LAB — IRON AND TIBC
Iron: 104 ug/dL (ref 28–170)
SATURATION RATIOS: 31 % (ref 10.4–31.8)
TIBC: 334 ug/dL (ref 250–450)
UIBC: 230 ug/dL

## 2018-05-27 LAB — FERRITIN: Ferritin: 48 ng/mL (ref 11–307)

## 2018-05-30 ENCOUNTER — Other Ambulatory Visit: Payer: Self-pay

## 2018-05-30 ENCOUNTER — Encounter: Payer: Self-pay | Admitting: Gastroenterology

## 2018-05-30 ENCOUNTER — Ambulatory Visit (INDEPENDENT_AMBULATORY_CARE_PROVIDER_SITE_OTHER): Payer: BLUE CROSS/BLUE SHIELD | Admitting: Gastroenterology

## 2018-05-30 VITALS — BP 127/77 | HR 87 | Ht 62.0 in | Wt 153.5 lb

## 2018-05-30 DIAGNOSIS — D500 Iron deficiency anemia secondary to blood loss (chronic): Secondary | ICD-10-CM

## 2018-05-30 NOTE — Progress Notes (Signed)
Gastroenterology Consultation  Referring Provider:     Sindy Guadeloupe, MD Primary Care Physician:  Melinda Haven, MD Primary Gastroenterologist:  Dr. Allen Norris     Reason for Consultation:     Iron deficiency        HPI:   Melinda Holder is a 47 y.o. y/o female referred for consultation & management of iron deficiency by Dr. Caryl Holder, Melinda Adam, MD.  This patient comes today after being found to have a ferritin of 7.  The patient had a low hemoglobin in the past with a hemoglobin of 8.7 back in July 2018.  The patient hemoglobin from 6 months ago 13.4.  The patient was instructed to double up on her iron recently when she had her lab work done by Dr. Janese Holder.  The patient also has a history of thrombocytosis.  Her MCV is now normal but had been low in the past.  In 2018 her iron was low at 23 with a saturation of 5%.  The patient states that she is being evaluated for possible gynecological cause for her iron deficiency. The patient denies any unexplained weight loss, fevers, chills, nausea or vomiting.  She also denies any black stools or bloody stools.  The patient has not had a previous colonoscopy or EGD. The patient reports that she has had increased constipation over the last year.  Past Medical History:  Diagnosis Date  . Amblyopia of eye, right   . Anemia   . Asthma   . Endometriosis     Past Surgical History:  Procedure Laterality Date  . ABLATION ON ENDOMETRIOSIS    . CLAVICLE SURGERY Left 10/2016  . EYE MUSCLE SURGERY Right    child  . ORIF CLAVICULAR FRACTURE Left 11/03/2016   Procedure: OPEN REDUCTION INTERNAL FIXATION (ORIF) CLAVICULAR FRACTURE;  Surgeon: Melinda Mull, MD;  Location: [facility] ORS;  Service: Orthopedics;  Laterality: Left;  . removal of fibroid      Prior to Admission medications   Medication Sig Start Date End Date Taking? Authorizing Provider  albuterol (PROVENTIL HFA;VENTOLIN HFA) 108 (90 Base) MCG/ACT inhaler TAKE 2 PUFFS BY MOUTH EVERY 6 HOURS AS  NEEDED FOR WHEEZE OR SHORTNESS OF BREATH 04/29/18   Burnard Hawthorne, FNP  azithromycin (ZITHROMAX) 250 MG tablet Take 2 tablets ( total 500 mg) PO on day 1, then take 1 tablet ( total 250 mg) by mouth q24h x 4 days. 04/06/18   Burnard Hawthorne, FNP  dicyclomine (BENTYL) 10 MG capsule Take 10 mg by mouth every 6 (six) hours as needed for pain. 06/29/17   [provider]  erythromycin ophthalmic ointment Use one half inch four times daily to affected eye (s) x 7 days. 04/06/18   Burnard Hawthorne, FNP  Glucosamine-Chondroit-Vit C-Mn (GLUCOSAMINE 1500 COMPLEX PO) Take by mouth.    [provider]  HYDROcodone-homatropine (HYCODAN) 5-1.5 MG/5ML syrup Take 5 mLs by mouth at bedtime as needed for cough. 04/06/18   Burnard Hawthorne, FNP  hydrOXYzine (ATARAX/VISTARIL) 10 MG tablet TAKE 1 TABLET (10 MG TOTAL) BY MOUTH AT BEDTIME. 02/22/18   Melinda Haven, MD  ibuprofen (ADVIL,MOTRIN) 200 MG tablet Take 400 mg by mouth every 6 (six) hours as needed for headache or moderate pain.    [provider]  norethindrone (MICRONOR,CAMILA,ERRIN) 0.35 MG tablet Take 1 tablet (0.35 mg total) by mouth daily. Patient not taking: Reported on 06/21/2017 02/24/17   Rod Can, CNM  ondansetron (ZOFRAN) 4 MG tablet  Take 4 mg by mouth every 6 (six) hours as needed for nausea/vomiting. 06/29/17   [provider]  oxyCODONE (ROXICODONE) 5 MG immediate release tablet Take 1-2 tablets (5-10 mg total) by mouth every 4 (four) hours as needed for severe pain. Patient not taking: Reported on 08/23/2017 11/03/16   Poggi, Marshall Cork, MD    Family History  Problem Relation Age of Onset  . Lupus Mother   . Hypertension Mother   . Hypertension Father   . Diabetes Father   . Cancer Paternal Aunt        Breast Cancer  . Cancer Paternal Aunt        Lung Cancer  . Cancer Other   . Breast cancer Neg Hx      Social History   Tobacco Use  . Smoking status: Former Smoker    Packs/day: 1.00     Years: 15.00    Pack years: 15.00    Types: Cigarettes    Last attempt to quit: 02/21/2015    Years since quitting: 3.2  . Smokeless tobacco: Never Used  Substance Use Topics  . Alcohol use: Yes    Alcohol/week: 0.0 oz    Comment: Occasional   . Drug use: No    Allergies as of 05/30/2018  . (No Known Allergies)    Review of Systems:    All systems reviewed and negative except where noted in HPI.   Physical Exam:  There were no vitals taken for this visit. No LMP recorded. Psych:  Alert and cooperative. Normal mood and affect. General:   Alert,  Well-developed, well-nourished, pleasant and cooperative in NAD Head:  Normocephalic and atraumatic. Eyes:  Sclera clear, no icterus.   Conjunctiva pink. Ears:  Normal auditory acuity. Nose:  No deformity, discharge, or lesions. Mouth:  No deformity or lesions,oropharynx pink & moist. Neck:  Supple; no masses or thyromegaly. Lungs:  Respirations even and unlabored.  Clear throughout to auscultation.   No wheezes, crackles, or rhonchi. No acute distress. Heart:  Regular rate and rhythm; no murmurs, clicks, rubs, or gallops. Abdomen:  Normal bowel sounds.  No bruits.  Soft, non-tender and non-distended without masses, hepatosplenomegaly or hernias noted.  No guarding or rebound tenderness.  Negative Carnett sign.   Rectal:  Deferred.  Msk:  Symmetrical without gross deformities.  Good, equal movement & strength bilaterally. Pulses:  Normal pulses noted. Extremities:  No clubbing or edema.  No cyanosis. Neurologic:  Alert and oriented x3;  grossly normal neurologically. Skin:  Intact without significant lesions or rashes.  No jaundice. Lymph Nodes:  No significant cervical adenopathy. Psych:  Alert and cooperative. Normal mood and affect.  Imaging Studies: No results found.  Assessment and Plan:   Melinda Holder is a 47 y.o. y/o female who has a history of iron deficient anemia who is now receiving IV iron.  The patient states  that p.o. iron upsets her stomach.  The patient also reports that she takes MiraLAX every day for her constipation.  The patient has had a low ferritin and was evaluated by hematology who recommended her to follow-up with GI.  The patient will be set up for an EGD and colonoscopy to rule out any GI source of her low ferritin and iron deficiency. I have discussed risks & benefits which include, but are not limited to, bleeding, infection, perforation & drug reaction.  The patient agrees with this plan & written consent will be obtained.     Lucilla Lame, MD.  FACG   Note: This dictation was prepared with Dragon dictation along with smaller phrase technology. Any transcriptional errors that result from this process are unintentional.

## 2018-06-01 ENCOUNTER — Telehealth: Payer: Self-pay

## 2018-06-01 NOTE — Telephone Encounter (Signed)
Returned patients call to reschedule her colonoscopy.  She has requested her date to be changed to July 1st.  Date has been changed with Maudie Mercury at [facility].  Thanks [employer]

## 2018-06-07 ENCOUNTER — Ambulatory Visit (INDEPENDENT_AMBULATORY_CARE_PROVIDER_SITE_OTHER): Payer: BLUE CROSS/BLUE SHIELD | Admitting: Family Medicine

## 2018-06-07 ENCOUNTER — Ambulatory Visit: Payer: BLUE CROSS/BLUE SHIELD | Admitting: Obstetrics and Gynecology

## 2018-06-07 ENCOUNTER — Encounter: Payer: Self-pay | Admitting: Family Medicine

## 2018-06-07 VITALS — BP 110/78 | HR 79 | Temp 97.8°F | Ht 62.0 in | Wt 153.4 lb

## 2018-06-07 DIAGNOSIS — K5900 Constipation, unspecified: Secondary | ICD-10-CM | POA: Diagnosis not present

## 2018-06-07 DIAGNOSIS — D509 Iron deficiency anemia, unspecified: Secondary | ICD-10-CM

## 2018-06-07 DIAGNOSIS — E663 Overweight: Secondary | ICD-10-CM | POA: Diagnosis not present

## 2018-06-07 DIAGNOSIS — R7309 Other abnormal glucose: Secondary | ICD-10-CM | POA: Insufficient documentation

## 2018-06-07 DIAGNOSIS — Q433 Congenital malformations of intestinal fixation: Secondary | ICD-10-CM

## 2018-06-07 LAB — BASIC METABOLIC PANEL
BUN: 13 mg/dL (ref 6–23)
CHLORIDE: 104 meq/L (ref 96–112)
CO2: 30 meq/L (ref 19–32)
Calcium: 9.6 mg/dL (ref 8.4–10.5)
Creatinine, Ser: 0.73 mg/dL (ref 0.40–1.20)
GFR: 91.17 mL/min (ref >60.00)
Glucose, Bld: 95 mg/dL (ref 70–99)
POTASSIUM: 4.8 meq/L (ref 3.5–5.1)
SODIUM: 139 meq/L (ref 135–145)

## 2018-06-07 LAB — HEMOGLOBIN A1C: Hgb A1c MFr Bld: 5.6 % (ref 4.6–6.5)

## 2018-06-07 NOTE — Assessment & Plan Note (Signed)
Her numbers have improved.  She will proceed with GI evaluation.  I discussed that she needs to follow-up with her gynecologist given her prolonged menstrual cycle recently.  Continue to see hematology.

## 2018-06-07 NOTE — Assessment & Plan Note (Signed)
Encouraged diet and exercise.  

## 2018-06-07 NOTE — Patient Instructions (Signed)
Nice to see you. We will check some lab work today. Please continue to see GI as well as hematology.  You need to see your GYN as well. Please continue the MiraLAX for your bowel movements. If you develop severe abdominal pain or blood in your stool please be evaluated.

## 2018-06-07 NOTE — Assessment & Plan Note (Signed)
She has had no recurrent abdominal pain symptoms.  She will monitor for recurrent symptoms.

## 2018-06-07 NOTE — Progress Notes (Signed)
[name], MD Phone: [phone]  Melinda Holder is a 47 y.o. female who presents today for f/u.  CC: iron deficiency anemia, bowel rotation  Anemia: Patient has seen GYN, GI, and hematology.  She is set up for colonoscopy with GI.  She received 2 transfusions of iron fairly recently.  Her numbers have trended up.  She typically notes menses once monthly lasting 3 to 4 days.  Last month it lasted 2 weeks.  She is using 4 pads per day.  Notes she just started on cycle today.  No intermenstrual bleeding.  No blood in her stool.  Since her last visit a year ago she was hospitalized for bowel malrotation.  She developed fairly severe abdominal pain that worsened.  It looks like she was treated for enteritis with antibiotics.  She had no surgery.  She notes no significant abdominal pain though her abdomen does ache at times when she gets constipated.  She will take MiraLAX and will have a bowel movement within 15 minutes.  If she takes that she has a bowel movement daily with no abdominal pain.  She has gotten away from eating fast food a lot over the last 2 weeks.  She is eating more salads and vegetables.  She is walking 3 times a week.  During her prior hospitalization her blood sugar was noted to be elevated.  She notes no polyuria or polydipsia.  Social History   Tobacco Use  Smoking Status Former Smoker  . Packs/day: 1.00  . Years: 15.00  . Pack years: 15.00  . Types: Cigarettes  . Last attempt to quit: 02/21/2015  . Years since quitting: 3.2  Smokeless Tobacco Never Used     ROS see history of present illness  Objective  Physical Exam Vitals:   06/07/18 0901  BP: 110/78  Pulse: 79  Temp: 97.8 F (36.6 C)  SpO2: 98%    BP Readings from Last 3 Encounters:  06/07/18 110/78  05/30/18 127/77  04/06/18 120/88   Wt Readings from Last 3 Encounters:  06/07/18 153 lb 6.4 oz (69.6 kg)  05/30/18 153 lb 8 oz (69.6 kg)  04/06/18 152 lb 8 oz (69.2 kg)    Physical  Exam  Constitutional: No distress.  Cardiovascular: Normal rate, regular rhythm and normal heart sounds.  Pulmonary/Chest: Effort normal and breath sounds normal.  Abdominal: Soft. Bowel sounds are normal. She exhibits no distension. There is no tenderness.  Musculoskeletal: She exhibits no edema.  Neurological: She is alert.  Skin: Skin is warm and dry. She is not diaphoretic.     Assessment/Plan: Please see individual problem list.  Malrotation of intestine She has had no recurrent abdominal pain symptoms.  She will monitor for recurrent symptoms.  Constipation This appears to be well treated with MiraLAX.  I suspect her abdominal aching is related to the constipation as she does not have this when she has good bowel movements.  Discussed increasing fiber rich foods in her diet.  She can continue MiraLAX.  Iron deficiency anemia Her numbers have improved.  She will proceed with GI evaluation.  I discussed that she needs to follow-up with her gynecologist given her prolonged menstrual cycle recently.  Continue to see hematology.  Overweight (BMI 25.0-29.9) Encouraged diet and exercise.  Elevated glucose On prior lab work.  Repeat labs.   Orders Placed This Encounter  Procedures  . Basic Metabolic Panel (BMET)  . HgB A1c    No orders of the defined types were placed in  this encounter.    [name], MD [place]

## 2018-06-07 NOTE — Assessment & Plan Note (Signed)
On prior lab work.  Repeat labs.

## 2018-06-07 NOTE — Assessment & Plan Note (Signed)
This appears to be well treated with MiraLAX.  I suspect her abdominal aching is related to the constipation as she does not have this when she has good bowel movements.  Discussed increasing fiber rich foods in her diet.  She can continue MiraLAX.

## 2018-06-21 ENCOUNTER — Encounter: Payer: Self-pay | Admitting: *Deleted

## 2018-06-21 ENCOUNTER — Other Ambulatory Visit: Payer: Self-pay

## 2018-06-24 NOTE — Discharge Instructions (Signed)
General Anesthesia, Adult, Care After °These instructions provide you with information about caring for yourself after your procedure. Your health care provider may also give you more specific instructions. Your treatment has been planned according to current medical practices, but problems sometimes occur. Call your health care provider if you have any problems or questions after your procedure. °What can I expect after the procedure? °After the procedure, it is common to have: °· Vomiting. °· A sore throat. °· Mental slowness. ° °It is common to feel: °· Nauseous. °· Cold or shivery. °· Sleepy. °· Tired. °· Sore or achy, even in parts of your body where you did not have surgery. ° °Follow these instructions at home: °For at least 24 hours after the procedure: °· Do not: °? Participate in activities where you could fall or become injured. °? Drive. °? Use heavy machinery. °? Drink alcohol. °? Take sleeping pills or medicines that cause drowsiness. °? Make important decisions or sign legal documents. °? Take care of children on your own. °· Rest. °Eating and drinking °· If you vomit, drink water, juice, or soup when you can drink without vomiting. °· Drink enough fluid to keep your urine clear or pale yellow. °· Make sure you have little or no nausea before eating solid foods. °· Follow the diet recommended by your health care provider. °General instructions °· Have a responsible adult stay with you until you are awake and alert. °· Return to your normal activities as told by your health care provider. Ask your health care provider what activities are safe for you. °· Take over-the-counter and prescription medicines only as told by your health care provider. °· If you smoke, do not smoke without supervision. °· Keep all follow-up visits as told by your health care provider. This is important. °Contact a health care provider if: °· You continue to have nausea or vomiting at home, and medicines are not helpful. °· You  cannot drink fluids or start eating again. °· You cannot urinate after 8-12 hours. °· You develop a skin rash. °· You have fever. °· You have increasing redness at the site of your procedure. °Get help right away if: °· You have difficulty breathing. °· You have chest pain. °· You have unexpected bleeding. °· You feel that you are having a life-threatening or urgent problem. °This information is not intended to replace advice given to you by your health care provider. Make sure you discuss any questions you have with your health care provider. °Document Released: 03/22/2001 Document Revised: 05/18/2016 Document Reviewed: 11/28/2015 °Elsevier Interactive Patient Education © 2018 [employer] ° °

## 2018-06-27 ENCOUNTER — Ambulatory Visit: Payer: BLUE CROSS/BLUE SHIELD | Admitting: Anesthesiology

## 2018-06-27 ENCOUNTER — Encounter
Admission: RE | Disposition: A | Discharge status: Home / Self Care | Payer: Self-pay | Source: Ambulatory Visit | Attending: Gastroenterology

## 2018-06-27 ENCOUNTER — Ambulatory Visit
Admission: RE | Admit: 2018-06-27 | Discharge: 2018-06-27 | Disposition: A | Discharge status: Home / Self Care | Payer: BLUE CROSS/BLUE SHIELD | Source: Ambulatory Visit | Attending: Gastroenterology | Admitting: Gastroenterology

## 2018-06-27 DIAGNOSIS — K2980 Duodenitis without bleeding: Secondary | ICD-10-CM | POA: Diagnosis not present

## 2018-06-27 DIAGNOSIS — K640 First degree hemorrhoids: Secondary | ICD-10-CM | POA: Insufficient documentation

## 2018-06-27 DIAGNOSIS — K2900 Acute gastritis without bleeding: Secondary | ICD-10-CM | POA: Diagnosis not present

## 2018-06-27 DIAGNOSIS — K2970 Gastritis, unspecified, without bleeding: Secondary | ICD-10-CM | POA: Insufficient documentation

## 2018-06-27 DIAGNOSIS — Z87891 Personal history of nicotine dependence: Secondary | ICD-10-CM | POA: Insufficient documentation

## 2018-06-27 DIAGNOSIS — K319 Disease of stomach and duodenum, unspecified: Secondary | ICD-10-CM | POA: Diagnosis not present

## 2018-06-27 DIAGNOSIS — Z7982 Long term (current) use of aspirin: Secondary | ICD-10-CM | POA: Diagnosis not present

## 2018-06-27 DIAGNOSIS — D509 Iron deficiency anemia, unspecified: Secondary | ICD-10-CM | POA: Diagnosis not present

## 2018-06-27 DIAGNOSIS — B9681 Helicobacter pylori [H. pylori] as the cause of diseases classified elsewhere: Secondary | ICD-10-CM | POA: Diagnosis not present

## 2018-06-27 DIAGNOSIS — K6389 Other specified diseases of intestine: Secondary | ICD-10-CM | POA: Diagnosis not present

## 2018-06-27 DIAGNOSIS — D500 Iron deficiency anemia secondary to blood loss (chronic): Secondary | ICD-10-CM | POA: Diagnosis not present

## 2018-06-27 HISTORY — PX: COLONOSCOPY WITH PROPOFOL: SHX5780

## 2018-06-27 HISTORY — PX: ESOPHAGOGASTRODUODENOSCOPY (EGD) WITH PROPOFOL: SHX5813

## 2018-06-27 HISTORY — DX: Failed or difficult intubation, initial encounter: T88.4XXA

## 2018-06-27 SURGERY — COLONOSCOPY WITH PROPOFOL
Anesthesia: General | Wound class: Contaminated

## 2018-06-27 MED ORDER — OXYCODONE HCL 5 MG PO TABS: 5.0000 mg | ORAL_TABLET | Freq: Once | ORAL | Status: DC | PRN

## 2018-06-27 MED ORDER — OXYCODONE HCL 5 MG/5ML PO SOLN: 5.0000 mg | Freq: Once | ORAL | Status: DC | PRN

## 2018-06-27 MED ORDER — ONDANSETRON HCL 4 MG/2ML IJ SOLN: 4.0000 mg | Freq: Once | INTRAMUSCULAR | Status: DC | PRN

## 2018-06-27 MED ORDER — SODIUM CHLORIDE 0.9 % IV SOLN: INTRAVENOUS | Status: DC

## 2018-06-27 MED ORDER — DEXMEDETOMIDINE HCL 200 MCG/2ML IV SOLN
INTRAVENOUS | Status: DC | PRN
  Administered 2018-06-27: 6 ug via INTRAVENOUS

## 2018-06-27 MED ORDER — LACTATED RINGERS IV SOLN: 10.0000 mL/h | INTRAVENOUS | Status: DC

## 2018-06-27 MED ORDER — GLYCOPYRROLATE 0.2 MG/ML IJ SOLN
INTRAMUSCULAR | Status: DC | PRN
  Administered 2018-06-27: .1 mg via INTRAVENOUS

## 2018-06-27 MED ORDER — LIDOCAINE HCL (CARDIAC) PF 100 MG/5ML IV SOSY
PREFILLED_SYRINGE | INTRAVENOUS | Status: DC | PRN
  Administered 2018-06-27: 50 mg via INTRAVENOUS

## 2018-06-27 MED ORDER — PROPOFOL 10 MG/ML IV BOLUS
INTRAVENOUS | Status: DC | PRN
  Administered 2018-06-27: 20 mg via INTRAVENOUS
  Administered 2018-06-27: 10 mg via INTRAVENOUS
  Administered 2018-06-27: 20 mg via INTRAVENOUS
  Administered 2018-06-27: 40 mg via INTRAVENOUS
  Administered 2018-06-27: 100 mg via INTRAVENOUS
  Administered 2018-06-27: 20 mg via INTRAVENOUS
  Administered 2018-06-27: 30 mg via INTRAVENOUS
  Administered 2018-06-27: 40 mg via INTRAVENOUS
  Administered 2018-06-27: 30 mg via INTRAVENOUS
  Administered 2018-06-27: 20 mg via INTRAVENOUS
  Administered 2018-06-27: 30 mg via INTRAVENOUS
  Administered 2018-06-27 (×4): 20 mg via INTRAVENOUS
  Administered 2018-06-27: 10 mg via INTRAVENOUS
  Administered 2018-06-27: 20 mg via INTRAVENOUS
  Administered 2018-06-27: 30 mg via INTRAVENOUS
  Administered 2018-06-27 (×2): 10 mg via INTRAVENOUS

## 2018-06-27 MED ORDER — LACTATED RINGERS IV SOLN
INTRAVENOUS | Status: DC
  Administered 2018-06-27: 09:00:00 via INTRAVENOUS

## 2018-06-27 MED ORDER — STERILE WATER FOR IRRIGATION IR SOLN
Status: DC | PRN
  Administered 2018-06-27: 3 mL

## 2018-06-27 SURGICAL SUPPLY — 36 items
BALLN DILATOR 10-12 8 (BALLOONS)
BALLN DILATOR 12-15 8 (BALLOONS)
BALLN DILATOR 15-18 8 (BALLOONS)
BALLN DILATOR CRE 0-12 8 (BALLOONS)
BALLN DILATOR ESOPH 8 10 CRE (MISCELLANEOUS) IMPLANT
BALLOON DILATOR 12-15 8 (BALLOONS) IMPLANT
BALLOON DILATOR 15-18 8 (BALLOONS) IMPLANT
BALLOON DILATOR CRE 0-12 8 (BALLOONS) IMPLANT
BLOCK BITE 60FR ADLT L/F GRN (MISCELLANEOUS) ×2 IMPLANT
CANISTER SUCT 1200ML W/VALVE (MISCELLANEOUS) ×2 IMPLANT
CLIP HMST 235XBRD CATH ROT (MISCELLANEOUS) IMPLANT
CLIP RESOLUTION 360 11X235 (MISCELLANEOUS)
ELECT REM PT RETURN 9FT ADLT (ELECTROSURGICAL)
ELECTRODE REM PT RTRN 9FT ADLT (ELECTROSURGICAL) IMPLANT
FCP ESCP3.2XJMB 240X2.8X (MISCELLANEOUS)
FORCEPS BIOP RAD 4 LRG CAP 4 (CUTTING FORCEPS) ×2 IMPLANT
FORCEPS BIOP RJ4 240 W/NDL (MISCELLANEOUS)
FORCEPS ESCP3.2XJMB 240X2.8X (MISCELLANEOUS) IMPLANT
GOWN CVR UNV OPN BCK APRN NK (MISCELLANEOUS) ×2 IMPLANT
GOWN ISOL THUMB LOOP REG UNIV (MISCELLANEOUS) ×2
INJECTOR VARIJECT VIN23 (MISCELLANEOUS) ×1 IMPLANT
KIT DEFENDO VALVE AND CONN (KITS) IMPLANT
KIT ENDO PROCEDURE OLY (KITS) ×2 IMPLANT
MARKER SPOT ENDO TATTOO 5ML (MISCELLANEOUS) ×1 IMPLANT
PROBE APC STR FIRE (PROBE) IMPLANT
RETRIEVER NET PLAT FOOD (MISCELLANEOUS) IMPLANT
RETRIEVER NET ROTH 2.5X230 LF (MISCELLANEOUS) IMPLANT
SNARE SHORT THROW 13M SML OVAL (MISCELLANEOUS) IMPLANT
SNARE SHORT THROW 30M LRG OVAL (MISCELLANEOUS) IMPLANT
SNARE SNG USE RND 15MM (INSTRUMENTS) IMPLANT
SPOT EX ENDOSCOPIC TATTOO (MISCELLANEOUS) ×1
SYR INFLATION 60ML (SYRINGE) IMPLANT
TRAP ETRAP POLY (MISCELLANEOUS) IMPLANT
VARIJECT INJECTOR VIN23 (MISCELLANEOUS) ×2
WATER STERILE IRR 250ML POUR (IV SOLUTION) ×2 IMPLANT
WIRE CRE 18-20MM 8CM F G (MISCELLANEOUS) IMPLANT

## 2018-06-27 NOTE — Op Note (Signed)
[facility] Gastroenterology Patient Name: Melinda Holder Procedure Date: 06/27/2018 9:41 AM MRN: [id number] Account #: [id number] Date of Birth: 18-Apr-1971 Admit Type: Outpatient Age: 47 Room: [facility] OR ROOM 01 Gender: Female Note Status: Finalized Procedure:            Colonoscopy Indications:          Iron deficiency anemia Providers:            Lucilla Lame MD, MD Medicines:            Monitored Anesthesia Care Complications:        No immediate complications. Procedure:            Pre-Anesthesia Assessment:                       - Prior to the procedure, a History and Physical was                        performed, and patient medications and allergies were                        reviewed. The patient's tolerance of previous                        anesthesia was also reviewed. The risks and benefits of                        the procedure and the sedation options and risks were                        discussed with the patient. All questions were                        answered, and informed consent was obtained. Prior                        Anticoagulants: The patient has taken no previous                        anticoagulant or antiplatelet agents. ASA Grade                        Assessment: II - A patient with mild systemic disease.                        After reviewing the risks and benefits, the patient was                        deemed in satisfactory condition to undergo the                        procedure.                       After obtaining informed consent, the colonoscope was                        passed under direct vision. Throughout the procedure,                        the patient's blood pressure, pulse, and  oxygen                        saturations were monitored continuously. The was                        introduced through the anus and advanced to the the                        terminal ileum. The colonoscopy was performed without                   difficulty. The patient tolerated the procedure well.                        The quality of the bowel preparation was excellent. Findings:      The perianal and digital rectal examinations were normal.      The terminal ileum appeared normal.      A localized area of severely thickened folds of the mucosa was found at       20 cm proximal to the anus. Biopsies were taken with a cold forceps for       histology. Area was tattooed with an injection of Spot (carbon black).      Non-bleeding internal hemorrhoids were found during retroflexion. The       hemorrhoids were Grade I (internal hemorrhoids that do not prolapse). Impression:           - The examined portion of the ileum was normal.                       - Thickened folds of the mucosa at 20 cm proximal to                        the anus. Biopsied. Tattooed.                       - Non-bleeding internal hemorrhoids. Recommendation:       - Discharge patient to home.                       - Resume previous diet.                       - Continue present medications.                       - Await pathology results. Procedure Code(s):    --- Professional ---                       [phone], Colonoscopy, flexible; with directed submucosal                        injection(s), any substance                       [id number], Colonoscopy, flexible; with biopsy, single or                        multiple Diagnosis Code(s):    --- Professional ---                       D50.9, Iron deficiency  anemia, unspecified                       K63.89, Other specified diseases of intestine CPT copyright 2017 American Medical Association. All rights reserved. The codes documented in this report are preliminary and upon coder review may  be revised to meet current compliance requirements. Lucilla Lame MD, MD 06/27/2018 10:20:30 AM This report has been signed electronically. Number of Addenda: 0 Note Initiated On: 06/27/2018 9:41 AM Scope Withdrawal  Time: 0 hours 9 minutes 58 seconds  Total Procedure Duration: 0 hours 17 minutes 15 seconds       [facility]

## 2018-06-27 NOTE — Transfer of Care (Signed)
Immediate Anesthesia Transfer of Care Note  Patient: Melinda Holder  Procedure(s) Performed: COLONOSCOPY WITH PROPOFOL (N/A ) ESOPHAGOGASTRODUODENOSCOPY (EGD) WITH PROPOFOL (N/A )  Patient Location: PACU  Anesthesia Type: General  Level of Consciousness: awake, alert  and patient cooperative  Airway and Oxygen Therapy: Patient Spontanous Breathing and Patient connected to supplemental oxygen  Post-op Assessment: Post-op Vital signs reviewed, Patient's Cardiovascular Status Stable, Respiratory Function Stable, Patent Airway and No signs of Nausea or vomiting  Post-op Vital Signs: Reviewed and stable  Complications: No apparent anesthesia complications

## 2018-06-27 NOTE — Anesthesia Procedure Notes (Signed)
Procedure Name: MAC Date/Time: 06/27/2018 9:42 AM Performed by: Janna Arch, CRNA Pre-anesthesia Checklist: Patient identified, Emergency Drugs available, Suction available, Patient being monitored and Timeout performed Patient Re-evaluated:Patient Re-evaluated prior to induction Oxygen Delivery Method: Nasal cannula

## 2018-06-27 NOTE — Anesthesia Preprocedure Evaluation (Signed)
Anesthesia Evaluation  Patient identified by MRN, date of birth, ID band Patient awake    Reviewed: Allergy & Precautions, H&P , NPO status , Patient's Chart, lab work & pertinent test results  History of Anesthesia Complications (+) DIFFICULT AIRWAY and history of anesthetic complications  Airway Mallampati: II  TM Distance: >3 FB     Dental no notable dental hx.    Pulmonary former smoker,    Pulmonary exam normal        Cardiovascular negative cardio ROS Normal cardiovascular exam     Neuro/Psych    GI/Hepatic negative GI ROS, Neg liver ROS,   Endo/Other  negative endocrine ROS  Renal/GU negative Renal ROS     Musculoskeletal   Abdominal   Peds  Hematology negative hematology ROS (+)   Anesthesia Other Findings   Reproductive/Obstetrics negative OB ROS                            Anesthesia Physical Anesthesia Plan  ASA: II  Anesthesia Plan: General   Post-op Pain Management:    Induction:   PONV Risk Score and Plan:   Airway Management Planned:   Additional Equipment:   Intra-op Plan:   Post-operative Plan:   Informed Consent: I have reviewed the patients History and Physical, chart, labs and discussed the procedure including the risks, benefits and alternatives for the proposed anesthesia with the patient or authorized representative who has indicated his/her understanding and acceptance.     Plan Discussed with:   Anesthesia Plan Comments:         Anesthesia Quick Evaluation

## 2018-06-27 NOTE — H&P (Signed)
Melinda Lame, MD [facility] [address] [place], [place] [address] Phone:[phone] Fax : [phone]  Primary Care Physician:  Leone Haven, MD Primary Gastroenterologist:  Dr. Allen Norris  Pre-Procedure History & Physical: HPI:  Melinda Holder is a 47 y.o. female is here for an endoscopy and colonoscopy.   Past Medical History:  Diagnosis Date  . Amblyopia of eye, right   . Anemia   . Asthma    childhood  . Difficult intubation 11/03/2016   DL x 2 with MAC 3 (CRNA and MD), very anterior. DL x 2 with Glidescope #3 Lo pro, bougie utilized. Easy mask.  . Endometriosis     Past Surgical History:  Procedure Laterality Date  . ABLATION ON ENDOMETRIOSIS    . CLAVICLE SURGERY Left 10/2016  . EYE MUSCLE SURGERY Right    child  . ORIF CLAVICULAR FRACTURE Left 11/03/2016   Procedure: OPEN REDUCTION INTERNAL FIXATION (ORIF) CLAVICULAR FRACTURE;  Surgeon: Corky Mull, MD;  Location: [facility] ORS;  Service: Orthopedics;  Laterality: Left;  . removal of fibroid      Prior to Admission medications   Medication Sig Start Date End Date Taking? Authorizing Provider  Aspirin-Acetaminophen-Caffeine (GOODY HEADACHE PO) Take by mouth as needed.   Yes [provider]  hydrOXYzine (ATARAX/VISTARIL) 10 MG tablet TAKE 1 TABLET (10 MG TOTAL) BY MOUTH AT BEDTIME. 02/22/18  Yes Leone Haven, MD  ibuprofen (ADVIL,MOTRIN) 200 MG tablet Take 400 mg by mouth every 6 (six) hours as needed for headache or moderate pain.   Yes [provider]  Multiple Vitamin (MULTIVITAMIN) tablet Take 1 tablet by mouth daily.   Yes [provider]    Allergies as of 05/30/2018  . (No Known Allergies)    Family History  Problem Relation Age of Onset  . Lupus Mother   . Hypertension Mother   . Hypertension Father   . Diabetes Father   . Cancer Paternal Aunt        Breast Cancer  . Cancer Paternal Aunt        Lung Cancer  . Cancer Other   . Breast cancer Neg Hx      Social History   Socioeconomic History  . Marital status: Married    Spouse name: 0  . Number of children: 0  . Years of education: Not on file  . Highest education level: Associate degree: academic program  Occupational History  . Occupation: Librarian, academic    Comment: Actuary  Social Needs  . Financial resource strain: Not on file  . Food insecurity:    Worry: Not on file    Inability: Not on file  . Transportation needs:    Medical: Not on file    Non-medical: Not on file  Tobacco Use  . Smoking status: Former Smoker    Packs/day: 1.00    Years: 15.00    Pack years: 15.00    Types: Cigarettes    Last attempt to quit: 02/21/2015    Years since quitting: 3.3  . Smokeless tobacco: Never Used  Substance and Sexual Activity  . Alcohol use: Yes    Alcohol/week: 1.2 oz    Types: 2 Cans of beer per week    Comment: Occasional   . Drug use: No  . Sexual activity: Yes    Partners: Male    Comment: 1 female  Lifestyle  . Physical activity:    Days per week: Not on file    Minutes per session:  Not on file  . Stress: Not on file  Relationships  . Social connections:    Talks on phone: Not on file    Gets together: Not on file    Attends religious service: Not on file    Active member of club or organization: Not on file    Attends meetings of clubs or organizations: Not on file    Relationship status: Not on file  . Intimate partner violence:    Fear of current or ex partner: Not on file    Emotionally abused: Not on file    Physically abused: Not on file    Forced sexual activity: Not on file  Other Topics Concern  . 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 Neck:  Supple; no masses or thyromegaly. Lungs:  Clear throughout to auscultation.    Heart:  Regular rate and rhythm. Abdomen:  Soft, nontender and nondistended. Normal bowel sounds, without guarding, and without rebound.   Neurologic:  Alert and  oriented x4;  grossly normal neurologically.  Impression/Plan: MALAYZIA [name] is here for an endoscopy and colonoscopy to be performed for IDA  Risks, benefits, limitations, and alternatives regarding  endoscopy and colonoscopy have been reviewed with the patient.  Questions have been answered.  All parties agreeable.   Melinda Lame, MD  06/27/2018, 9:11 AM

## 2018-06-27 NOTE — Op Note (Signed)
[facility] Gastroenterology Patient Name: Melinda Holder Procedure Date: 06/27/2018 9:42 AM MRN: [id number] Account #: [id number] Date of Birth: 1971-05-07 Admit Type: Outpatient Age: 47 Room: [facility] OR ROOM 01 Gender: Female Note Status: Finalized Procedure:            Upper GI endoscopy Indications:          Iron deficiency anemia Providers:            Lucilla Lame MD, MD Referring MD:         Angela Adam. Caryl Bis (Referring MD) Medicines:            Propofol per Anesthesia Complications:        No immediate complications. Procedure:            Pre-Anesthesia Assessment:                       - Prior to the procedure, a History and Physical was                        performed, and patient medications and allergies were                        reviewed. The patient's tolerance of previous                        anesthesia was also reviewed. The risks and benefits of                        the procedure and the sedation options and risks were                        discussed with the patient. All questions were                        answered, and informed consent was obtained. Prior                        Anticoagulants: The patient has taken no previous                        anticoagulant or antiplatelet agents. ASA Grade                        Assessment: II - A patient with mild systemic disease.                        After reviewing the risks and benefits, the patient was                        deemed in satisfactory condition to undergo the                        procedure.                       After obtaining informed consent, the endoscope was                        passed under direct vision. Throughout the procedure,  the patient's blood pressure, pulse, and oxygen                        saturations were monitored continuously. The was                        introduced through the mouth, and advanced to the   second part of duodenum. The upper GI endoscopy was                        accomplished without difficulty. The patient tolerated                        the procedure well. Findings:      The examined esophagus was normal.      Localized moderate inflammation characterized by erosions was found in       the gastric antrum. Biopsies were taken with a cold forceps for       histology.      Localized mild inflammation characterized by erythema was found in the       duodenal bulb. Biopsies were taken with a cold forceps for histology.      Biopsies were taken with a cold forceps in the entire duodenum for       histology. Impression:           - Normal esophagus.                       - Gastritis. Biopsied.                       - Duodenitis. Biopsied.                       - Biopsies were taken with a cold forceps for histology                        in the entire duodenum. Recommendation:       - Discharge patient to home.                       - Resume previous diet.                       - Continue present medications.                       - Await pathology results.                       - Perform a colonoscopy today. Procedure Code(s):    --- Professional ---                       [phone], Esophagogastroduodenoscopy, flexible, transoral;                        with biopsy, single or multiple Diagnosis Code(s):    --- Professional ---                       D50.9, Iron deficiency anemia, unspecified                       K29.70, Gastritis, unspecified,  without bleeding                       K29.80, Duodenitis without bleeding CPT copyright 2017 American Medical Association. All rights reserved. The codes documented in this report are preliminary and upon coder review may  be revised to meet current compliance requirements. Lucilla Lame MD, MD 06/27/2018 9:55:37 AM This report has been signed electronically. Number of Addenda: 0 Note Initiated On: 06/27/2018 9:42 AM Total Procedure  Duration: 0 hours 3 minutes 36 seconds       [facility]

## 2018-06-27 NOTE — Anesthesia Postprocedure Evaluation (Signed)
Anesthesia Post Note  Patient: Melinda Holder  Procedure(s) Performed: COLONOSCOPY WITH PROPOFOL (N/A ) ESOPHAGOGASTRODUODENOSCOPY (EGD) WITH PROPOFOL (N/A )  Patient location during evaluation: PACU Anesthesia Type: General Level of consciousness: awake and alert Pain management: pain level controlled Vital Signs Assessment: post-procedure vital signs reviewed and stable Respiratory status: spontaneous breathing Cardiovascular status: blood pressure returned to baseline Postop Assessment: no headache Anesthetic complications: no    Jaci Standard, III,  Lawonda Pretlow D

## 2018-06-28 ENCOUNTER — Encounter: Payer: Self-pay | Admitting: Gastroenterology

## 2018-07-04 ENCOUNTER — Encounter: Payer: Self-pay | Admitting: Gastroenterology

## 2018-07-05 ENCOUNTER — Encounter: Payer: Self-pay | Admitting: Gastroenterology

## 2018-08-02 ENCOUNTER — Other Ambulatory Visit: Payer: Self-pay

## 2018-08-02 MED ORDER — AMOXICILLIN 500 MG PO CAPS: 1000.0000 mg | ORAL_CAPSULE | Freq: Two times a day (BID) | ORAL | 0 refills | Status: DC

## 2018-08-02 MED ORDER — OMEPRAZOLE 20 MG PO CPDR: 20.0000 mg | DELAYED_RELEASE_CAPSULE | Freq: Two times a day (BID) | ORAL | 1 refills | Status: DC

## 2018-08-02 MED ORDER — CLARITHROMYCIN 500 MG PO TABS: 500.0000 mg | ORAL_TABLET | Freq: Two times a day (BID) | ORAL | 0 refills | Status: DC

## 2018-08-19 ENCOUNTER — Other Ambulatory Visit: Payer: Self-pay | Admitting: Family Medicine

## 2018-08-22 ENCOUNTER — Ambulatory Visit: Payer: BLUE CROSS/BLUE SHIELD | Admitting: Oncology

## 2018-08-22 ENCOUNTER — Other Ambulatory Visit: Payer: BLUE CROSS/BLUE SHIELD

## 2018-09-12 ENCOUNTER — Other Ambulatory Visit: Payer: BLUE CROSS/BLUE SHIELD

## 2018-09-12 ENCOUNTER — Ambulatory Visit: Payer: BLUE CROSS/BLUE SHIELD | Admitting: Oncology

## 2018-09-20 ENCOUNTER — Other Ambulatory Visit: Payer: Self-pay

## 2018-09-20 DIAGNOSIS — A048 Other specified bacterial intestinal infections: Secondary | ICD-10-CM

## 2018-09-30 ENCOUNTER — Telehealth: Payer: Self-pay

## 2018-09-30 NOTE — Telephone Encounter (Signed)
Patient scheduled.

## 2018-09-30 NOTE — Telephone Encounter (Signed)
Copied from [place] [id number]. Topic: Appointment Scheduling - Scheduling Inquiry for Clinic >> Sep 30, 2018  8:42 AM Sheran Luz wrote: Reason for CRM: Pt would like to know if she can be worked in with nurse on 10/7 for flu shot at 10:00 am.

## 2018-10-03 ENCOUNTER — Inpatient Hospital Stay: Payer: BLUE CROSS/BLUE SHIELD | Attending: Oncology | Admitting: Oncology

## 2018-10-03 ENCOUNTER — Other Ambulatory Visit: Payer: Self-pay | Admitting: Oncology

## 2018-10-03 ENCOUNTER — Encounter: Payer: Self-pay | Admitting: Oncology

## 2018-10-03 ENCOUNTER — Ambulatory Visit: Payer: BLUE CROSS/BLUE SHIELD

## 2018-10-03 ENCOUNTER — Inpatient Hospital Stay: Payer: BLUE CROSS/BLUE SHIELD

## 2018-10-03 ENCOUNTER — Telehealth: Payer: Self-pay

## 2018-10-03 VITALS — BP 127/80 | HR 76 | Temp 97.8°F | Resp 18 | Ht 62.0 in | Wt 150.9 lb

## 2018-10-03 DIAGNOSIS — D473 Essential (hemorrhagic) thrombocythemia: Secondary | ICD-10-CM

## 2018-10-03 DIAGNOSIS — D509 Iron deficiency anemia, unspecified: Secondary | ICD-10-CM

## 2018-10-03 DIAGNOSIS — Z23 Encounter for immunization: Secondary | ICD-10-CM | POA: Diagnosis not present

## 2018-10-03 DIAGNOSIS — D75839 Thrombocytosis, unspecified: Secondary | ICD-10-CM

## 2018-10-03 LAB — CBC
HCT: 39.1 % (ref 35.0–47.0)
Hemoglobin: 13.5 g/dL (ref 12.0–16.0)
MCH: 31.9 pg (ref 26.0–34.0)
MCHC: 34.5 g/dL (ref 32.0–36.0)
MCV: 92.6 fL (ref 80.0–100.0)
Platelets: 483 10*3/uL — ABNORMAL HIGH (ref 150–440)
RBC: 4.22 MIL/uL (ref 3.80–5.20)
RDW: 13.2 % (ref 11.5–14.5)
WBC: 4.7 10*3/uL (ref 3.6–11.0)

## 2018-10-03 LAB — IRON AND TIBC
Iron: 72 ug/dL (ref 28–170)
SATURATION RATIOS: 20 % (ref 10.4–31.8)
TIBC: 356 ug/dL (ref 250–450)
UIBC: 284 ug/dL

## 2018-10-03 LAB — FERRITIN: FERRITIN: 19 ng/mL (ref 11–307)

## 2018-10-03 MED ORDER — INFLUENZA VAC SPLIT QUAD 0.5 ML IM SUSY
0.5000 mL | PREFILLED_SYRINGE | Freq: Once | INTRAMUSCULAR | Status: AC
  Administered 2018-10-03: 0.5 mL via INTRAMUSCULAR

## 2018-10-03 NOTE — Telephone Encounter (Signed)
-----   Message from Sindy Guadeloupe, MD sent at 10/03/2018 12:31 PM EDT ----- She is not anemic but ferritin is low. We could give her 2 doses of feraheme if she is agreeable. Thanks, Astrid Divine

## 2018-10-03 NOTE — Patient Instructions (Signed)

## 2018-10-03 NOTE — Progress Notes (Signed)
No new changes noted today 

## 2018-10-03 NOTE — Progress Notes (Signed)
`    Hematology/Oncology Consult note [facility]  [phone] [phone]  Patient Care Team: Leone Haven, MD as PCP - General (Family Medicine)   Name of the patient: Melinda Holder  [id number]  [birth year]   Date of visit: 10/03/18  Diagnosis- 1. Thrombocytosis likely reactive  2. Iron deficiency anemia likely due to menorrhagia  Chief complaint/ Reason for visit- routine f/u of anemia  Heme/Onc history: patient is a 47 year old female with a history of iron deficiency anemia, constipation pruritus who has been referred to [place] for evaluation of thrombocytosis. Recent CBC from 06/04/2017 showed white count of 6.1, H&H of 10.7/33.1 with an MCV of 80.8 and a platelet count of 594. Iron studies showed low ferritin of 4.9, low serum iron of 23 and iron saturation of 5%. TSH and LFTs were within normal limits.One year ago her H&H was 10.8/32.6 and platelet count was 529. CBC from February 2014 was normal including a platelet count of 414.  Patient does report having menstrual bleeding. Her menstrual cycles lasts for 5 days for the first 2-3 days a particularly heavy and she needs to change her son to 3 pads every hour or so. She has seen gynecology in the past and was recommended birth control pills but after going through the side effects she did not wish to take them. Denies any consistent use of NSAIDs. Occasionally takes them once a week for headache. Denies any bright red blood in stools, dark tarry stools or blood loss in her stool or urine. Denies any nosebleeds or gum bleeds. Reports some fatigue. Denies other complaints. No family history of colon cancer. Her appetite is good and she denies any unintentional weight loss.  Results of blood work from 06/21/2017 were as follows: CBC showed white count of 6.2, H&H of 11.2/34.3 and a platelet count of 492. Review of peripheral smear showed mild thrombocytosis. RBC showed anisocytosis  leukocytes are unremarkable. ESR was normal at 11. Jak 2 testing was negative. CBC on 08/20/2017 showed white count of 6.9, H&H of 13.7/40.9 and a platelet count of 362. Iron studies were within normal limits. Ferritin increased to 85 from 4.9.  Last dose of feraheme was in March 2019  Thrombocytosis: Platelet counts were in the 500's in 2017 when she was anemic. Normalized transiently in July and august 2018 and then back up in the 400's now. JAK 2 mutation testing negative. No h/o thrombosis. Given that she is <60 with no thrombosis, bone marrow biopsy will not change management even if she were to have MPN. Also she has waxing and waning platelet counts and likelihood of MPN is low at this time   Interval history- she is doing well. Denies any blood in stool or urine. denies any fatigue  ECOG PS- 0 Pain scale- 0   Review of systems- Review of Systems  Constitutional: Negative for chills, fever, malaise/fatigue and weight loss.  HENT: Negative for congestion, ear discharge and nosebleeds.   Eyes: Negative for blurred vision.  Respiratory: Negative for cough, hemoptysis, sputum production, shortness of breath and wheezing.   Cardiovascular: Negative for chest pain, palpitations, orthopnea and claudication.  Gastrointestinal: Negative for abdominal pain, blood in stool, constipation, diarrhea, heartburn, melena, nausea and vomiting.  Genitourinary: Negative for dysuria, flank pain, frequency, hematuria and urgency.  Musculoskeletal: Negative for back pain, joint pain and myalgias.  Skin: Negative for rash.  Neurological: Negative for dizziness, tingling, focal weakness, seizures, weakness and headaches.  Endo/Heme/Allergies: Does not bruise/bleed easily.  Psychiatric/Behavioral: Negative for depression and suicidal ideas. The patient does not have insomnia.       No Known Allergies   Past Medical History:  Diagnosis Date  . Amblyopia of eye, right   . Anemia   . Asthma     childhood  . Difficult intubation 11/03/2016   DL x 2 with MAC 3 (CRNA and MD), very anterior. DL x 2 with Glidescope #3 Lo pro, bougie utilized. Easy mask.  . Endometriosis      Past Surgical History:  Procedure Laterality Date  . ABLATION ON ENDOMETRIOSIS    . CLAVICLE SURGERY Left 10/2016  . COLONOSCOPY WITH PROPOFOL N/A 06/27/2018   Procedure: COLONOSCOPY WITH PROPOFOL;  Surgeon: Lucilla Lame, MD;  Location: [place];  Service: Endoscopy;  Laterality: N/A;  . ESOPHAGOGASTRODUODENOSCOPY (EGD) WITH PROPOFOL N/A 06/27/2018   Procedure: ESOPHAGOGASTRODUODENOSCOPY (EGD) WITH PROPOFOL;  Surgeon: Lucilla Lame, MD;  Location: [place];  Service: Endoscopy;  Laterality: N/A;  . EYE MUSCLE SURGERY Right    child  . ORIF CLAVICULAR FRACTURE Left 11/03/2016   Procedure: OPEN REDUCTION INTERNAL FIXATION (ORIF) CLAVICULAR FRACTURE;  Surgeon: Corky Mull, MD;  Location: [facility];  Service: Orthopedics;  Laterality: Left;  . removal of fibroid      Social History   Socioeconomic History  . Marital status: Married    Spouse name: 0  . Number of children: 0  . Years of education: Not on file  . Highest education level: Associate degree: academic program  Occupational History  . Occupation: Librarian, academic    Comment: Actuary  Social Needs  . Financial resource strain: Not on file  . Food insecurity:    Worry: Not on file    Inability: Not on file  . Transportation needs:    Medical: Not on file    Non-medical: Not on file  Tobacco Use  . Smoking status: Former Smoker    Packs/day: 1.00    Years: 15.00    Pack years: 15.00    Types: Cigarettes    Last attempt to quit: 02/21/2015    Years since quitting: 3.6  . Smokeless tobacco: Never Used  Substance and Sexual Activity  . Alcohol use: Yes    Alcohol/week: 2.0 standard drinks    Types: 2 Cans of beer per week    Comment: Occasional   . Drug use: No  . Sexual activity: Yes    Partners: Male    Comment:  1 female  Lifestyle  . Physical activity:    Days per week: Not on file    Minutes per session: Not on file  . Stress: Not on file  Relationships  . Social connections:    Talks on phone: Not on file    Gets together: Not on file    Attends religious service: Not on file    Active member of club or organization: Not on file    Attends meetings of clubs or organizations: Not on file    Relationship status: Not on file  . Intimate partner violence:    Fear of current or ex partner: Not on file    Emotionally abused: Not on file    Physically abused: Not on file    Forced sexual activity: Not on file  Other Topics Concern  . 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 Lupus Mother   . Hypertension Mother   . Hypertension Father   . Diabetes Father   . Cancer Paternal Aunt        Breast Cancer  . Cancer Paternal Aunt        Lung Cancer  . Cancer Other   . Breast cancer Neg Hx      Current Outpatient Medications:  .  amoxicillin (AMOXIL) 500 MG capsule, Take 2 capsules (1,000 mg total) by mouth 2 (two) times daily., Disp: 56 capsule, Rfl: 0 .  Aspirin-Acetaminophen-Caffeine (GOODY HEADACHE PO), Take by mouth as needed., Disp: , Rfl:  .  clarithromycin (BIAXIN) 500 MG tablet, Take 1 tablet (500 mg total) by mouth 2 (two) times daily., Disp: 28 tablet, Rfl: 0 .  hydrOXYzine (ATARAX/VISTARIL) 10 MG tablet, TAKE 1 TABLET BY MOUTH EVERYDAY AT BEDTIME, Disp: 30 tablet, Rfl: 5 .  ibuprofen (ADVIL,MOTRIN) 200 MG tablet, Take 400 mg by mouth every 6 (six) hours as needed for headache or moderate pain., Disp: , Rfl:  .  Multiple Vitamin (MULTIVITAMIN) tablet, Take 1 tablet by mouth daily., Disp: , Rfl:  .  omeprazole (PRILOSEC) 20 MG capsule, Take 1 capsule (20 mg total) by mouth 2 (two)  times daily before a meal., Disp: 60 capsule, Rfl: 1  Physical exam: There were no vitals filed for this visit. Physical Exam  Constitutional: She is oriented to person, place, and time.  HENT:  Head: Normocephalic and atraumatic.  Eyes: Pupils are equal, round, and reactive to light. EOM are normal.  Neck: Normal range of motion.  Cardiovascular: Normal rate, regular rhythm and normal heart sounds.  Pulmonary/Chest: Effort normal and breath sounds normal.  Abdominal: Soft. Bowel sounds are normal.  Neurological: She is alert and oriented to person, place, and time.  Skin: Skin is warm and dry.     CMP Latest Ref Rng & Units 06/07/2018  Glucose 70 - 99 mg/dL 95  BUN 6 - 23 mg/dL 13  Creatinine 0.40 - 1.20 mg/dL 0.73  Sodium 135 - 145 mEq/L 139  Potassium 3.5 - 5.1 mEq/L 4.8  Chloride 96 - 112 mEq/L 104  CO2 19 - 32 mEq/L 30  Calcium 8.4 - 10.5 mg/dL 9.6  Total Protein 6.5 - 8.1 g/dL -  Total Bilirubin 0.3 - 1.2 mg/dL -  Alkaline Phos 38 - 126 U/L -  AST 15 - 41 U/L -  ALT 14 - 54 U/L -   CBC Latest Ref Rng & Units 10/03/2018  WBC 3.6 - 11.0 K/uL 4.7  Hemoglobin 12.0 - 16.0 g/dL 13.5  Hematocrit 35.0 - 47.0 % 39.1  Platelets 150 - 440 K/uL 483(H)      Assessment and plan- Patient is a 47 y.o. female with following issues:  1. Iron deficiency anemia: She is not anemic today and her hemoglobin has remained stable around 33 months to 1 year.  Iron studies from today are being.  Given the stability of her counts I will repeat a CBC with 4 and 8 months and see her back in 8 months  2.  Thrombocytosis: Likely be.  Her platelet counts have been in the 500s about 2 years ago and they are normal on many occasions as well and occasionally it goes up to the 470s to 480s.  Jak 2 mutation testing was negative.  I will hold off on further testing  at this time since her counts wax and wane and the likelihood of a primary myeloproliferative disorder is low at this time  She will get her  flu shot today at our office.   Visit Diagnosis 1. Iron deficiency anemia, unspecified iron deficiency anemia type   2. Thrombocytosis ([place])   3. Need for prophylactic vaccination and inoculation against influenza      Dr. Randa Evens, MD, MPH [facility] [phone] 10/03/2018 8:46 AM

## 2018-10-03 NOTE — Telephone Encounter (Signed)
spoke with the patient to inform her that she is not anemic but her ferritin levels are low and will benefit form IV feraheme. The patient was agreeable and understanding to get treatment.

## 2018-10-04 LAB — VITAMIN B12: Vitamin B-12: 395 pg/mL (ref 180–914)

## 2018-10-06 ENCOUNTER — Other Ambulatory Visit: Payer: Self-pay | Admitting: Family Medicine

## 2018-10-06 DIAGNOSIS — Z1231 Encounter for screening mammogram for malignant neoplasm of breast: Secondary | ICD-10-CM

## 2018-10-10 ENCOUNTER — Ambulatory Visit
Admission: RE | Admit: 2018-10-10 | Discharge: 2018-10-10 | Disposition: A | Discharge status: Home / Self Care | Payer: BLUE CROSS/BLUE SHIELD | Source: Ambulatory Visit | Attending: Family Medicine | Admitting: Family Medicine

## 2018-10-10 DIAGNOSIS — Z1231 Encounter for screening mammogram for malignant neoplasm of breast: Secondary | ICD-10-CM | POA: Insufficient documentation

## 2018-10-11 ENCOUNTER — Other Ambulatory Visit: Payer: Self-pay | Admitting: Family Medicine

## 2018-10-11 DIAGNOSIS — R928 Other abnormal and inconclusive findings on diagnostic imaging of breast: Secondary | ICD-10-CM

## 2018-10-11 DIAGNOSIS — N6320 Unspecified lump in the left breast, unspecified quadrant: Secondary | ICD-10-CM

## 2018-10-21 ENCOUNTER — Ambulatory Visit
Admission: RE | Admit: 2018-10-21 | Discharge: 2018-10-21 | Disposition: A | Discharge status: Home / Self Care | Payer: BLUE CROSS/BLUE SHIELD | Source: Ambulatory Visit | Attending: Family Medicine | Admitting: Family Medicine

## 2018-10-21 ENCOUNTER — Telehealth: Payer: Self-pay | Admitting: Family

## 2018-10-21 DIAGNOSIS — R928 Other abnormal and inconclusive findings on diagnostic imaging of breast: Secondary | ICD-10-CM

## 2018-10-21 DIAGNOSIS — N6320 Unspecified lump in the left breast, unspecified quadrant: Secondary | ICD-10-CM | POA: Diagnosis not present

## 2018-10-21 DIAGNOSIS — N6489 Other specified disorders of breast: Secondary | ICD-10-CM | POA: Diagnosis not present

## 2018-10-21 NOTE — Telephone Encounter (Signed)
Call pt  She has abnormal mammogram and report shows she needs a LEFT breast biopsy and post procedure mammogram  I am calling as I am covering for Dr Caryl Bis today; I  want to ensure that she has been contacted to schedule the biopsy/mammogram.    I want to make sure that this process moves promptly, smoothly.   Let us know how we can help  Recommendation :   Ultrasound-guided core biopsy of the suspicious hypoechoic area in the LEFT breast at the 12 o'clock axis, 3 cm from the nipple, measuring 1.1 cm. 2. Postprocedure mammogram to ensure sonographic and mammographic correlation. If findings do not correspond, would then recommend stereotactic biopsy with 3D tomosynthesis guidance for the possible subtle architectural distortion seen on mammogram.

## 2018-10-24 ENCOUNTER — Other Ambulatory Visit: Payer: Self-pay | Admitting: Family Medicine

## 2018-10-24 DIAGNOSIS — R928 Other abnormal and inconclusive findings on diagnostic imaging of breast: Secondary | ICD-10-CM

## 2018-10-24 DIAGNOSIS — N6320 Unspecified lump in the left breast, unspecified quadrant: Secondary | ICD-10-CM

## 2018-10-24 NOTE — Telephone Encounter (Signed)
noted 

## 2018-10-24 NOTE — Telephone Encounter (Signed)
Spoke with patient on 10/21/18 and she states she is aware of abnormal mammogram and [facility]  will be contacting her in regards to appointment for biopsy . It looks like she is scheduled for 10/26/18 scheduled by Sherryle Lis

## 2018-10-26 ENCOUNTER — Ambulatory Visit
Admission: RE | Admit: 2018-10-26 | Discharge: 2018-10-26 | Disposition: A | Discharge status: Home / Self Care | Payer: BLUE CROSS/BLUE SHIELD | Source: Ambulatory Visit | Attending: Family Medicine | Admitting: Family Medicine

## 2018-10-26 DIAGNOSIS — R928 Other abnormal and inconclusive findings on diagnostic imaging of breast: Secondary | ICD-10-CM | POA: Insufficient documentation

## 2018-10-26 DIAGNOSIS — N6320 Unspecified lump in the left breast, unspecified quadrant: Secondary | ICD-10-CM

## 2018-10-26 DIAGNOSIS — C50812 Malignant neoplasm of overlapping sites of left female breast: Secondary | ICD-10-CM | POA: Diagnosis not present

## 2018-10-26 DIAGNOSIS — N6325 Unspecified lump in the left breast, overlapping quadrants: Secondary | ICD-10-CM | POA: Diagnosis not present

## 2018-10-27 ENCOUNTER — Other Ambulatory Visit: Payer: Self-pay | Admitting: Anatomic Pathology & Clinical Pathology

## 2018-10-27 DIAGNOSIS — C801 Malignant (primary) neoplasm, unspecified: Secondary | ICD-10-CM

## 2018-10-27 HISTORY — DX: Malignant (primary) neoplasm, unspecified: C80.1

## 2018-10-28 ENCOUNTER — Encounter: Payer: Self-pay | Admitting: *Deleted

## 2018-10-28 DIAGNOSIS — C50912 Malignant neoplasm of unspecified site of left female breast: Secondary | ICD-10-CM

## 2018-10-28 HISTORY — PX: BREAST BIOPSY: SHX20

## 2018-10-28 HISTORY — PX: BREAST EXCISIONAL BIOPSY: SUR124

## 2018-10-28 NOTE — Progress Notes (Signed)
  Oncology Nurse Navigator Documentation  Navigator Location: [facility] (10/28/18 1200) Referral date to RadOnc/MedOnc: 11/01/18 (10/28/18 1200) )Navigator Encounter Type: Introductory phone call (10/28/18 1200)   Abnormal Finding Date: 10/21/18 (10/28/18 1200) Confirmed Diagnosis Date: 10/27/18 (10/28/18 1200)                   Barriers/Navigation Needs: Coordination of Care (10/28/18 1200)   Interventions: Coordination of Care (10/28/18 1200)   Coordination of Care: Appts (10/28/18 1200)                  Time Spent with Patient: 30 (10/28/18 1200)   Called patient to establish navigation services.  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  I have scheduled her to see Dr. Janese Banks whom she saw earlier this month for anemia.  She is also scheduled to see Dr. Clerance Lav on 11/02/18 @ 11:15.  She would like to pick up her educational material at her oncology vist on the 5th.  She is to call if she has any questions or needs.

## 2018-10-31 LAB — SURGICAL PATHOLOGY

## 2018-11-01 ENCOUNTER — Encounter: Payer: Self-pay | Admitting: Oncology

## 2018-11-01 ENCOUNTER — Inpatient Hospital Stay: Payer: BLUE CROSS/BLUE SHIELD | Attending: Oncology | Admitting: Oncology

## 2018-11-01 ENCOUNTER — Inpatient Hospital Stay: Payer: BLUE CROSS/BLUE SHIELD

## 2018-11-01 ENCOUNTER — Encounter: Payer: Self-pay | Admitting: *Deleted

## 2018-11-01 VITALS — BP 125/82 | HR 89 | Temp 97.9°F | Resp 18 | Ht 62.0 in | Wt 152.3 lb

## 2018-11-01 DIAGNOSIS — C50912 Malignant neoplasm of unspecified site of left female breast: Secondary | ICD-10-CM

## 2018-11-01 DIAGNOSIS — C50812 Malignant neoplasm of overlapping sites of left female breast: Secondary | ICD-10-CM | POA: Diagnosis not present

## 2018-11-01 DIAGNOSIS — Z170 Estrogen receptor positive status [ER+]: Secondary | ICD-10-CM | POA: Diagnosis not present

## 2018-11-01 DIAGNOSIS — R590 Localized enlarged lymph nodes: Secondary | ICD-10-CM | POA: Diagnosis not present

## 2018-11-01 DIAGNOSIS — Z803 Family history of malignant neoplasm of breast: Secondary | ICD-10-CM | POA: Diagnosis not present

## 2018-11-01 DIAGNOSIS — Z87891 Personal history of nicotine dependence: Secondary | ICD-10-CM | POA: Diagnosis not present

## 2018-11-01 DIAGNOSIS — Z7189 Other specified counseling: Secondary | ICD-10-CM

## 2018-11-01 NOTE — Progress Notes (Signed)
  Oncology Nurse Navigator Documentation  Navigator Location: [facility] (11/01/18 1500) Referral date to RadOnc/MedOnc: 11/01/18 (11/01/18 1500) )Navigator Encounter Type: Initial MedOnc (11/01/18 1500)         Genetic Counseling Date: 11/01/18 (11/01/18 1500) Genetic Counseling Type: Urgent (11/01/18 1500)         Patient Visit Type: MedOnc (11/01/18 1500) Treatment Phase: Pre-Tx/Tx Discussion (11/01/18 1500) Barriers/Navigation Needs: Education (11/01/18 1500)   Interventions: Education (11/01/18 1500)     Education Method: Written (11/01/18 1500)                Time Spent with Patient: 15 (11/01/18 1500)   Met patient today after her initial medical oncology consult with Dr. Janese Banks.  Patient has surgical consult tomorrow.  Per patient's recall of the visit, she states "I am a stage 1, will do additional testing to see if I need chemo and then radiation therapy.  Gave patient breast cancer educational literature, "My Breast Cancer Treatment Handbook" by Josephine Igo, RN.  She is to call if she has any questions or needs.

## 2018-11-01 NOTE — Progress Notes (Signed)
No new changes noted today 

## 2018-11-02 ENCOUNTER — Encounter: Payer: Self-pay | Admitting: Surgery

## 2018-11-02 ENCOUNTER — Ambulatory Visit (INDEPENDENT_AMBULATORY_CARE_PROVIDER_SITE_OTHER): Payer: BLUE CROSS/BLUE SHIELD | Admitting: Surgery

## 2018-11-02 ENCOUNTER — Other Ambulatory Visit: Payer: Self-pay

## 2018-11-02 VITALS — BP 133/86 | HR 87 | Temp 98.2°F | Resp 14 | Ht 62.0 in | Wt 154.0 lb

## 2018-11-02 DIAGNOSIS — Z170 Estrogen receptor positive status [ER+]: Secondary | ICD-10-CM | POA: Diagnosis not present

## 2018-11-02 DIAGNOSIS — C50912 Malignant neoplasm of unspecified site of left female breast: Secondary | ICD-10-CM | POA: Diagnosis not present

## 2018-11-02 NOTE — Patient Instructions (Addendum)
The patient is aware to call back for any questions or new concern

## 2018-11-03 ENCOUNTER — Other Ambulatory Visit: Payer: Self-pay

## 2018-11-03 ENCOUNTER — Other Ambulatory Visit: Payer: Self-pay | Admitting: *Deleted

## 2018-11-03 ENCOUNTER — Encounter: Payer: Self-pay | Admitting: *Deleted

## 2018-11-03 ENCOUNTER — Telehealth: Payer: Self-pay | Admitting: Genetic Counselor

## 2018-11-03 ENCOUNTER — Other Ambulatory Visit: Payer: Self-pay | Admitting: Surgery

## 2018-11-03 ENCOUNTER — Encounter: Payer: Self-pay | Admitting: Genetic Counselor

## 2018-11-03 DIAGNOSIS — A048 Other specified bacterial intestinal infections: Secondary | ICD-10-CM

## 2018-11-03 DIAGNOSIS — C50912 Malignant neoplasm of unspecified site of left female breast: Secondary | ICD-10-CM

## 2018-11-03 DIAGNOSIS — Z170 Estrogen receptor positive status [ER+]: Principal | ICD-10-CM

## 2018-11-03 NOTE — Telephone Encounter (Signed)
Cancer Genetics            Telegenetics Initial Visit    Patient Name: Melinda Holder Patient DOB: 17-Nov-1971 Patient Age: 48 y.o. Phone Call Date: 11/03/2018  Referring Provider: Randa Evens, MD  Reason for Visit: Evaluate for hereditary susceptibility to cancer    Assessment and Plan:  . Melinda Holder's personal history of breast cancer at age 20 warrants a genetics evaluation even though her family history is not highly suggestive of a hereditary predisposition to cancer. She does, however, have a paternal aunt who had breast cancer at age 23. She meets her insurance criteria for testing.  . Testing is recommended to determine whether she has a pathogenic mutation that will impact her screening and risk-reduction for cancer. A negative result will be reassuring.  . Melinda Holder wished to pursue genetic testing, and will schedule a blood draw. Analysis will include the 84 genes on [facility]'s Multi-Cancer panel (AIP, ALK, APC, ATM, AXIN2, BAP1, BARD1, BLM, BMPR1A, BRCA1, BRCA2, BRIP1, CASR, CDC73, CDH1, CDK4, CDKN1B, CDKN1C, CDKN2A, CEBPA, CHEK2, CTNNA1, DICER1, DIS3L2, EGFR, EPCAM, FH, FLCN, GATA2, GPC3, GREM1, HOXB13, HRAS, KIT, MAX, MEN1, MET, MITF, MLH1, MSH2, MSH3, MSH6, MUTYH, NBN, NF1, NF2, NTHL1, PALB2, PDGFRA, PHOX2B, PMS2, POLD1, POLE, POT1, PRKAR1A, PTCH1, PTEN, RAD50, RAD51C, RAD51D, RB1, RECQL4, RET, RUNX1, SDHA, SDHAF2, SDHB, SDHC, SDHD, SMAD4, SMARCA4, SMARCB1, SMARCE1, STK11, SUFU, TERC, TERT, TMEM127, TP53, TSC1, TSC2, VHL, WRN, WT1).   . Once the lab receives her specimen, results should be available in approximately 2-3 weeks, at which point I will contact her and address implications for her as well as address genetic testing for at-risk family members, if needed.     Dr. Grayland Ormond was available for questions concerning this case. Total time spent by counseling by phone was approximately 20 minutes.    _____________________________________________________________________   History of Present Illness: Melinda Holder, a 47 y.o. female, was referred for genetic counseling to discuss the possibility of a hereditary predisposition to cancer and discuss whether genetic testing is warranted. This was a telegenetics visit via phone.  Melinda Holder was diagnosed with breast cancer at the age of 32. She stated that she met with her surgeon yesterday and will not be using results of genetic testing to make surgical decisions.  The breast tumor was ER positive, PR positive, and HER2 negative.    Malignant neoplasm of left female breast ([place])   11/01/2018 Cancer Staging    Staging form: Breast, AJCC 8th Edition - Clinical stage from 11/01/2018: Stage IA (cT1c, cN0, cM0, G2, ER+, PR+, HER2-) - Signed by Sindy Guadeloupe, MD on 11/03/2018    11/03/2018 Initial Diagnosis    Malignant neoplasm of left female breast [facility])     Past Medical History:  Diagnosis Date  . Amblyopia of eye, right   . Anemia   . Asthma    childhood  . Cancer ([place]) 10/27/2018   left breast  . Difficult intubation 11/03/2016   DL x 2 with MAC 3 (CRNA and MD), very anterior. DL x 2 with Glidescope #3 Lo pro, bougie utilized. Easy mask.  . Endometriosis     Past Surgical History:  Procedure Laterality Date  . ABLATION ON ENDOMETRIOSIS    . CLAVICLE SURGERY Left 10/2016  . COLONOSCOPY WITH PROPOFOL N/A 06/27/2018   Procedure: COLONOSCOPY WITH PROPOFOL;  Surgeon: Lucilla Lame, MD;  Location: [place];  Service: Endoscopy;  Laterality: N/A;  . ESOPHAGOGASTRODUODENOSCOPY (EGD) WITH PROPOFOL N/A 06/27/2018   Procedure: ESOPHAGOGASTRODUODENOSCOPY (EGD) WITH PROPOFOL;  Surgeon: Lucilla Lame, MD;  Location: [place];  Service: Endoscopy;  Laterality: N/A;  . EYE MUSCLE SURGERY Right    child  . ORIF CLAVICULAR FRACTURE Left 11/03/2016   Procedure: OPEN REDUCTION INTERNAL FIXATION (ORIF) CLAVICULAR FRACTURE;   Surgeon: Corky Mull, MD;  Location: [facility];  Service: Orthopedics;  Laterality: Left;  . removal of fibroid      Family History: Significant diagnoses include the following:  Family History  Problem Relation Age of Onset  . Lupus Mother   . Hypertension Mother   . Lymphoma Mother 12       B cell; currently 47  . Hypertension Father        currently 55  . Diabetes Father   . Breast cancer Paternal Aunt 30       deceased 109  . Lung cancer Paternal Aunt 32       deceased 76  . Brain cancer Paternal Grandfather 53       deceased 12    Additionally, Melinda Holder has no children. She has one brother (age 65) and he has one son. Her mother had 2 sisters. Her father (age 77) had 4 sisters.  Melinda Holder ancestry is African American. There is no known Jewish ancestry and no consanguinity.  Discussion: We reviewed the characteristics, features and inheritance patterns of hereditary cancer syndromes. We discussed her risk of harboring a mutation in the context of her personal and family history. We discussed the process of genetic testing, insurance coverage and implications of results: positive, negative and variant of uncertain significance (VUS).   Melinda Holder questions were answered to her satisfaction today and she is welcome to call with any additional questions or concerns. Thank you for the referral and allowing [place] to share in the care of your patient.    Steele Berg, MS, [place] Certified Genetic Counselor phone: [phone]

## 2018-11-03 NOTE — Progress Notes (Signed)
Hematology/Oncology Consult note [facility]  Telephone:([phone] Fax:([phone]  Patient Care Team: Leone Haven, MD as PCP - General (Family Medicine)   Name of the patient: Melinda Holder  [id number]  June 06, 1971   Date of visit: 11/03/18  Diagnosis- 1.  New diagnosis of breast cancer 2.  Iron deficiency menorrhagia.  Ascites is likely reactive  Chief complaint/ Reason for visit-discuss management options for new diagnosis of breast cancer  Heme/Onc history: Patient is a 47 year old premenopausal woman who sees me for her iron deficiency anemia.  She has received Feraheme in the past last in March 2019 and her hemoglobin had improved.  Patient also has thrombocytosis dating back to 2017.  Jak 2 mutation testing has been negative.  Bone marrow biopsy not done at this time as she is less than 46 years of age with no prior history of thrombosis.  She is now seeing me for her new diagnosis of breast cancer.  Patient has not had any prior abnormal mammograms or breast biopsies palpated a breast mass at the 12 o'clock position of her left breast which was followed by screening and diagnostic mammogram.  It showed 1.1 cm left breast mass at the 12 o'clock position 3 cm from the nipple.  Multiple benign cysts in the upper quadrant of the left breast.  No axillary lymphadenopathy.  This was biopsied and biopsy showed 7 mm grade 2 invasive mammary carcinoma strongly ER PR positive and HER-2/neu negative.  Patient will be seeing Dr. Dahlia Byes tomorrow.  Family history significant for breast cancer in her paternal aunt.  Her mother has newly diagnosed diffuse large B-cell lymphoma no other family history of pancreatic colon or ovarian or prostate cancer.  She is G0P0L0.  Menarche at the age of 47  Interval history-she is otherwise doing well and denies any complaints at this time she is not on birth control  ECOG PS- 0 Pain scale- 0   Review of systems- Review  of Systems  Constitutional: Negative for chills, fever, malaise/fatigue and weight loss.  HENT: Negative for congestion, ear discharge and nosebleeds.   Eyes: Negative for blurred vision.  Respiratory: Negative for cough, hemoptysis, sputum production, shortness of breath and wheezing.   Cardiovascular: Negative for chest pain, palpitations, orthopnea and claudication.  Gastrointestinal: Negative for abdominal pain, blood in stool, constipation, diarrhea, heartburn, melena, nausea and vomiting.  Genitourinary: Negative for dysuria, flank pain, frequency, hematuria and urgency.  Musculoskeletal: Negative for back pain, joint pain and myalgias.  Skin: Negative for rash.  Neurological: Negative for dizziness, tingling, focal weakness, seizures, weakness and headaches.  Endo/Heme/Allergies: Does not bruise/bleed easily.  Psychiatric/Behavioral: Negative for depression and suicidal ideas. The patient does not have insomnia.       No Known Allergies   Past Medical History:  Diagnosis Date  . Amblyopia of eye, right   . Anemia   . Asthma    childhood  . Cancer ([place]) 10/27/2018   left breast  . Difficult intubation 11/03/2016   DL x 2 with MAC 3 (CRNA and MD), very anterior. DL x 2 with Glidescope #3 Lo pro, bougie utilized. Easy mask.  . Endometriosis      Past Surgical History:  Procedure Laterality Date  . ABLATION ON ENDOMETRIOSIS    . CLAVICLE SURGERY Left 10/2016  . COLONOSCOPY WITH PROPOFOL N/A 06/27/2018   Procedure: COLONOSCOPY WITH PROPOFOL;  Surgeon: Lucilla Lame, MD;  Location: [place];  Service: Endoscopy;  Laterality: N/A;  .  ESOPHAGOGASTRODUODENOSCOPY (EGD) WITH PROPOFOL N/A 06/27/2018   Procedure: ESOPHAGOGASTRODUODENOSCOPY (EGD) WITH PROPOFOL;  Surgeon: Lucilla Lame, MD;  Location: [place];  Service: Endoscopy;  Laterality: N/A;  . EYE MUSCLE SURGERY Right    child  . ORIF CLAVICULAR FRACTURE Left 11/03/2016   Procedure: OPEN REDUCTION INTERNAL  FIXATION (ORIF) CLAVICULAR FRACTURE;  Surgeon: Corky Mull, MD;  Location: [facility];  Service: Orthopedics;  Laterality: Left;  . removal of fibroid      Social History   Socioeconomic History  . Marital status: Married    Spouse name: 0  . Number of children: 0  . Years of education: Not on file  . Highest education level: Associate degree: academic program  Occupational History  . Occupation: Librarian, academic    Comment: Actuary  Social Needs  . Financial resource strain: Not on file  . Food insecurity:    Worry: Not on file    Inability: Not on file  . Transportation needs:    Medical: Not on file    Non-medical: Not on file  Tobacco Use  . Smoking status: Former Smoker    Packs/day: 1.00    Years: 15.00    Pack years: 15.00    Types: Cigarettes    Last attempt to quit: 02/21/2015    Years since quitting: 3.7  . Smokeless tobacco: Never Used  Substance and Sexual Activity  . Alcohol use: Yes    Alcohol/week: 2.0 standard drinks    Types: 2 Cans of beer per week    Comment: Occasional   . Drug use: No  . Sexual activity: Yes    Partners: Male    Comment: 1 female  Lifestyle  . Physical activity:    Days per week: Not on file    Minutes per session: Not on file  . Stress: Not on file  Relationships  . Social connections:    Talks on phone: Not on file    Gets together: Not on file    Attends religious service: Not on file    Active member of club or organization: Not on file    Attends meetings of clubs or organizations: Not on file    Relationship status: Not on file  . Intimate partner violence:    Fear of current or ex partner: Not on file    Emotionally abused: Not on file    Physically abused: Not on file    Forced sexual activity: Not on file  Other Topics Concern  . 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 Lupus Mother   . Hypertension Mother   . Lymphoma Mother        B cell  . Hypertension Father   . Diabetes Father   . Cancer Paternal Aunt 68       Breast Cancer  . Cancer Paternal Aunt 103       Lung Cancer  . Cancer Other 2       brain, paternal  . Breast cancer Neg Hx      Current Outpatient Medications:  .  hydrOXYzine (ATARAX/VISTARIL) 10 MG tablet, TAKE 1 TABLET BY MOUTH EVERYDAY AT BEDTIME, Disp: 30 tablet, Rfl: 5 .  Aspirin-Acetaminophen-Caffeine (GOODY HEADACHE  PO), Take by mouth as needed., Disp: , Rfl:  .  ibuprofen (ADVIL,MOTRIN) 200 MG tablet, Take 400 mg by mouth every 6 (six) hours as needed for headache or moderate pain., Disp: , Rfl:  .  Multiple Vitamin (MULTIVITAMIN) tablet, Take 1 tablet by mouth daily., Disp: , Rfl:  .  omeprazole (PRILOSEC) 20 MG capsule, Take 1 capsule (20 mg total) by mouth 2 (two) times daily before a meal. (Patient taking differently: Take 20 mg by mouth as needed. ), Disp: 60 capsule, Rfl: 1  Physical exam:  Vitals:   11/01/18 1348  BP: 125/82  Pulse: 89  Resp: 18  Temp: 97.9 F (36.6 C)  TempSrc: Tympanic  SpO2: 97%  Weight: 152 lb 4.8 oz (69.1 kg)  Height: _0  (1.575 m)   Physical Exam  Constitutional: She is oriented to person, place, and time.  HENT:  Head: Normocephalic and atraumatic.  Eyes: Pupils are equal, round, and reactive to light. EOM are normal.  Neck: Normal range of motion.  Cardiovascular: Normal rate, regular rhythm and normal heart sounds.  Pulmonary/Chest: Effort normal and breath sounds normal.  Abdominal: Soft. Bowel sounds are normal.  Neurological: She is alert and oriented to person, place, and time.  Skin: Skin is warm and dry.  Breast exam performed in sitting and lying down position.  No evidence of bilateral axillary adenopathy.  No palpable masses in the right breast.  1 cm mass palpable superficially at the 12 o'clock position in  the left breast 3 cm away from the nipple  CMP Latest Ref Rng & Units 06/07/2018  Glucose 70 - 99 mg/dL 95  BUN 6 - 23 mg/dL 13  Creatinine 0.40 - 1.20 mg/dL 0.73  Sodium 135 - 145 mEq/L 139  Potassium 3.5 - 5.1 mEq/L 4.8  Chloride 96 - 112 mEq/L 104  CO2 19 - 32 mEq/L 30  Calcium 8.4 - 10.5 mg/dL 9.6  Total Protein 6.5 - 8.1 g/dL -  Total Bilirubin 0.3 - 1.2 mg/dL -  Alkaline Phos 38 - 126 U/L -  AST 15 - 41 U/L -  ALT 14 - 54 U/L -   CBC Latest Ref Rng & Units 10/03/2018  WBC 3.6 - 11.0 K/uL 4.7  Hemoglobin 12.0 - 16.0 g/dL 13.5  Hematocrit 35.0 - 47.0 % 39.1  Platelets 150 - 440 K/uL 483(H)    No images are attached to the encounter.  US Breast Ltd Uni Left Inc Axilla  Result Date: 10/21/2018 CLINICAL DATA:  Patient returns today to evaluate a possible mass identified within the upper-outer quadrant of the LEFT breast on recent screening mammogram. EXAM: DIGITAL DIAGNOSTIC LEFT MAMMOGRAM WITH CAD AND TOMO ULTRASOUND LEFT BREAST COMPARISON:  Previous exams including recent screening mammogram dated 10/10/2018. ACR Breast Density Category d: The breast tissue is extremely dense, which lowers the sensitivity of mammography. FINDINGS: At least 2 partially obscured masses are confirmed within the upper-outer quadrant of the LEFT breast, largest measuring approximately 9 mm greatest dimension. There is an additional possible subtle architectural distortion within the upper LEFT breast, at anterior depth, 12-1 o'clock axis, true lateral slice 24, screening CC slice 35. Mammographic images were processed with CAD. Targeted ultrasound is performed, showing multiple benign cysts within the upper-outer quadrant of the LEFT breast, 2 o'clock axis, largest measuring 9 mm, corresponding to the mammographic findings. There is also an irregular hypoechoic area in the LEFT breast at the 12 o'clock axis, 3 cm from the nipple, with associated posterior acoustic  shadowing, measuring 1.1 x 0.9 x 1 cm, a  possible correlate for the possible subtle architectural distortion seen on mammogram. LEFT axilla was evaluated with ultrasound showing no enlarged or morphologically abnormal lymph nodes. IMPRESSION: 1. Irregular hypoechoic area in the LEFT breast at the 12 o'clock axis, 3 cm from the nipple, measuring 1.1 cm greatest dimension, a possible correlate for possible subtle architectural distortion seen within the upper LEFT breast on today's diagnostic mammogram and on the screening mammogram CC view slice 35. Ultrasound-guided biopsy is recommended. 2. Multiple benign cysts within the upper-outer quadrant of the LEFT breast. RECOMMENDATION: 1. Ultrasound-guided core biopsy of the suspicious hypoechoic area in the LEFT breast at the 12 o'clock axis, 3 cm from the nipple, measuring 1.1 cm. 2. Postprocedure mammogram to ensure sonographic and mammographic correlation. If findings do not correspond, would then recommend stereotactic biopsy with 3D tomosynthesis guidance for the possible subtle architectural distortion seen on mammogram. Ordering physician will be contacted with today's results and patient will then be scheduled for ultrasound-guided core biopsy at her earliest convenience. I have discussed the findings and recommendations with the patient. Results were also provided in writing at the conclusion of the visit. If applicable, a reminder letter will be sent to the patient regarding the next appointment. BI-RADS CATEGORY  4: Suspicious. Electronically Signed   By: Franki Cabot M.D.   On: 10/21/2018 15:01   Mm Diag Breast Tomo Uni Left  Result Date: 10/21/2018 CLINICAL DATA:  Patient returns today to evaluate a possible mass identified within the upper-outer quadrant of the LEFT breast on recent screening mammogram. EXAM: DIGITAL DIAGNOSTIC LEFT MAMMOGRAM WITH CAD AND TOMO ULTRASOUND LEFT BREAST COMPARISON:  Previous exams including recent screening mammogram dated 10/10/2018. ACR Breast Density Category  d: The breast tissue is extremely dense, which lowers the sensitivity of mammography. FINDINGS: At least 2 partially obscured masses are confirmed within the upper-outer quadrant of the LEFT breast, largest measuring approximately 9 mm greatest dimension. There is an additional possible subtle architectural distortion within the upper LEFT breast, at anterior depth, 12-1 o'clock axis, true lateral slice 24, screening CC slice 35. Mammographic images were processed with CAD. Targeted ultrasound is performed, showing multiple benign cysts within the upper-outer quadrant of the LEFT breast, 2 o'clock axis, largest measuring 9 mm, corresponding to the mammographic findings. There is also an irregular hypoechoic area in the LEFT breast at the 12 o'clock axis, 3 cm from the nipple, with associated posterior acoustic shadowing, measuring 1.1 x 0.9 x 1 cm, a possible correlate for the possible subtle architectural distortion seen on mammogram. LEFT axilla was evaluated with ultrasound showing no enlarged or morphologically abnormal lymph nodes. IMPRESSION: 1. Irregular hypoechoic area in the LEFT breast at the 12 o'clock axis, 3 cm from the nipple, measuring 1.1 cm greatest dimension, a possible correlate for possible subtle architectural distortion seen within the upper LEFT breast on today's diagnostic mammogram and on the screening mammogram CC view slice 35. Ultrasound-guided biopsy is recommended. 2. Multiple benign cysts within the upper-outer quadrant of the LEFT breast. RECOMMENDATION: 1. Ultrasound-guided core biopsy of the suspicious hypoechoic area in the LEFT breast at the 12 o'clock axis, 3 cm from the nipple, measuring 1.1 cm. 2. Postprocedure mammogram to ensure sonographic and mammographic correlation. If findings do not correspond, would then recommend stereotactic biopsy with 3D tomosynthesis guidance for the possible subtle architectural distortion seen on mammogram. Ordering physician will be contacted  with today's results and patient will then be scheduled  for ultrasound-guided core biopsy at her earliest convenience. I have discussed the findings and recommendations with the patient. Results were also provided in writing at the conclusion of the visit. If applicable, a reminder letter will be sent to the patient regarding the next appointment. BI-RADS CATEGORY  4: Suspicious. Electronically Signed   By: Franki Cabot M.D.   On: 10/21/2018 15:01   Mm 3d Screen Breast Bilateral  Result Date: 10/11/2018 CLINICAL DATA:  Screening. EXAM: DIGITAL SCREENING BILATERAL MAMMOGRAM WITH TOMO AND CAD COMPARISON:  Previous exam(s). ACR Breast Density Category d: The breast tissue is extremely dense, which lowers the sensitivity of mammography. FINDINGS: In the left breast, a possible mass warrants further evaluation. This possible mass, or masses, is seen within the upper-outer quadrant, at posterior depth, CC slice 33. In the right breast, no findings suspicious for malignancy. Images were processed with CAD. IMPRESSION: Further evaluation is suggested for possible mass in the left breast. RECOMMENDATION: Diagnostic mammogram and possibly ultrasound of the left breast. (Code:FI-L-58M) The patient will be contacted regarding the findings, and additional imaging will be scheduled. BI-RADS CATEGORY  0: Incomplete. Need additional imaging evaluation and/or prior mammograms for comparison. Electronically Signed   By: Franki Cabot M.D.   On: 10/11/2018 08:39   Mm Clip Placement Left  Result Date: 10/26/2018 CLINICAL DATA:  Status post ultrasound-guided core biopsy of left breast shadowing and distortion. EXAM: DIAGNOSTIC LEFT MAMMOGRAM POST ULTRASOUND BIOPSY COMPARISON:  Previous exam(s). FINDINGS: Mammographic images were obtained following ultrasound guided biopsy of the 12 o'clock region of the left breast. Mammographic images show there is a coil shaped clip in appropriate position in the 12 o'clock region of the  left breast. IMPRESSION: Status post ultrasound-guided core biopsy of the left breast with pathology pending. Final Assessment: Post Procedure Mammograms for Marker Placement Electronically Signed   By: Lillia Mountain M.D.   On: 10/26/2018 08:52   [place] Lt Breast Bx W Loc Dev 1st Lesion Img Bx Spec US Guide  Addendum Date: 10/28/2018   ADDENDUM REPORT: 10/28/2018 13:25 ADDENDUM: Pathology of the left breast biopsy revealed A. BREAST, LEFT, 12 O'CLOCK 3 CM FROM NIPPLE; ULTRASOUND-GUIDED CORE BIOPSY: INVASIVE MAMMARY CARCINOMA, NO SPECIAL TYPE. Size of invasive carcinoma: 7 mm in this sample. Histologic grade of invasive carcinoma: Grade 2. Ductal carcinoma in situ: Not identified. Lymphovascular invasion: Not identified. ER/PR/HER2: Immunohistochemistry will be performed on block A1, with reflex to [place] for HER2 2+. The results will be reported in an addendum. Comment: The definitive grade will be assigned on the excisional specimen. These findings were communicated to Dollene Cleveland in Dr. Clarise Cruz office on 10/27/2018 at 10:09 AM. Read back procedure was performed. This was found to be concordant with Dr. Clarise Cruz impression and notes. Recommendation: Surgical and oncology referrals. At the patient's request, results and recommendations were relayed to the patient by phone by Dr. Jetta Lout on 10/27/18. The patient stated she did well following the biopsy. Post biopsy instructions were reviewed with the patient and all of her questions were answered. She was encouraged to contact the [facility] with any further questions or concerns. Request for referral was relayed to the nurse navigators for [facility] by [place], [place] on 10/27/18. Tanya Nones, RN contacted the patient and referrals were made to Dr. Janese Banks, oncologist for 11/01/18 and Dr. Dahlia Byes, surgeon for 11/02/18. Addendum by Jetta Lout, RRA on 10/28/18. Electronically Signed   By: Lillia Mountain M.D.   On: 10/28/2018 13:25  Result Date: 10/28/2018 CLINICAL DATA:  Left breast mass and distortion. EXAM: ULTRASOUND GUIDED LEFT BREAST CORE NEEDLE BIOPSY COMPARISON:  Previous exam(s). FINDINGS: I met with the patient and we discussed the procedure of ultrasound-guided biopsy, including benefits and alternatives. We discussed the high likelihood of a successful procedure. We discussed the risks of the procedure, including infection, bleeding, tissue injury, clip migration, and inadequate sampling. Informed written consent was given. The usual time-out protocol was performed immediately prior to the procedure. Lesion quadrant: 12 o'clock Using sterile technique and 1% Lidocaine as local anesthetic, under direct ultrasound visualization, a 14 gauge spring-loaded device was used to perform biopsy of shadowing in the 12 o'clock region of the left breast using a lateral to medial approach. At the conclusion of the procedure a coil shaped tissue marker clip was deployed into the biopsy cavity. Follow up 2 view mammogram was performed and dictated separately. IMPRESSION: Ultrasound guided biopsy of the left breast. No apparent complications. Electronically Signed: By: Lillia Mountain M.D. On: 10/26/2018 08:51     Assessment and plan- Patient is a 47 y.o. female with newly diagnosed invasive mammary carcinoma of the left breast stage IA cT1c cN0 cM0 ER PR positive HER-2/neu negative  I discussed the results of the mammogram and pathology with the patient in detail.  Overall patient has a small strongly ER PR positive and HER-2/neu clinically suspicious adenopathy noted on mammogram.  As such I would recommend that she should proceed with upfront lumpectomy at this time and I will see her back after her surgery to discuss the results of final pathology and further management.  Given that her tumor on mammogram is greater than 1 cm in size ambulate to she will need Oncotype testing after lumpectomy to determine if she would benefit from  chemotherapy or not  Discussed briefly what Oncotype testing is and how the results are interpreted.  Given that she is less than 60 years of age and premenopausal if this is less than 11 she falls below risks of adjuvant chemotherapy adjuvant chemotherapy will be warranted if her score is 26 or higher.  She may may not need chemotherapy if her score is between 10-25 and I will discuss this further after her results are back.  She will not need chemotherapy for a score of 11-16.  There would also be a role for adjuvant radiation after surgery and I will make referral to radiation oncology at her next visit.  Her tumor is strongly ER PR positive and there would be a role for hormone therapy for 5 years which may either be tamoxifen or AI plus OS based on if she needs chemotherapy or not.  Treatment will be given with a curative intent.  Patient understands and agrees to proceed as planned  I will tentatively see him back in 2 weeks time after her surgery  Given family history of breast cancer as well as young age of personal history of breast cancer I will refer her to genetic counseling at this time as well  Cancer Staging Malignant neoplasm of left female breast [facility]) Staging form: Breast, AJCC 8th Edition - Clinical stage from 11/01/2018: Stage IA (cT1c, cN0, cM0, G2, ER+, PR+, HER2-) - Signed by Sindy Guadeloupe, MD on 11/03/2018    Total face to face encounter time for this patient visit was 45 min. >50% of the time was  spent in counseling and coordination of care.   Visit Diagnosis 1. Malignant neoplasm of left female breast, unspecified  estrogen receptor status, unspecified site of breast ([place])   2. Goals of care, counseling/discussion      Dr. Randa Evens, MD, MPH [facility] at [facility] [phone] 11/03/2018 8:24 AM

## 2018-11-03 NOTE — H&P (View-Only) (Signed)
Patient ID: Melinda Holder, female   DOB: 04-Jun-1971, 47 y.o.   MRN: [id number]  HPI Melinda Holder is a 47 y.o. female seen in consultation at the request of Dr. Janese Banks for newly diagnosed breast cancer on the left side. She reports having felt a small knot several months ago. No pain, no change in her nipple or skin on her breast, no nipple DC. Ultrasound and mammogram have been personally reviewed showing an abnormal area 1 cm x 1 cm 12:00 3 cm from the nipple in the left side.  Mammogram showing is invasive mammary carcinoma ER and PR positive and HER-2 negative. She is premenopausal. Menarche age 89. No hormonal replacement. Hx of thrombocytosis.  Peternal aunt w hx breast CA. Mother w Lymphoma. G0P0L0. Able to perform more than 4 METS of activity without any shortness of the chest pain.  She is concerned about potential deformity of her breast after partial mastectomy She does not smoke.  HPI  Past Medical History:  Diagnosis Date  . Amblyopia of eye, right   . Anemia   . Asthma    childhood  . Cancer ([place]) 10/27/2018   left breast  . Difficult intubation 11/03/2016   DL x 2 with MAC 3 (CRNA and MD), very anterior. DL x 2 with Glidescope #3 Lo pro, bougie utilized. Easy mask.  . Endometriosis     Past Surgical History:  Procedure Laterality Date  . ABLATION ON ENDOMETRIOSIS    . CLAVICLE SURGERY Left 10/2016  . COLONOSCOPY WITH PROPOFOL N/A 06/27/2018   Procedure: COLONOSCOPY WITH PROPOFOL;  Surgeon: Lucilla Lame, MD;  Location: [place];  Service: Endoscopy;  Laterality: N/A;  . ESOPHAGOGASTRODUODENOSCOPY (EGD) WITH PROPOFOL N/A 06/27/2018   Procedure: ESOPHAGOGASTRODUODENOSCOPY (EGD) WITH PROPOFOL;  Surgeon: Lucilla Lame, MD;  Location: [place];  Service: Endoscopy;  Laterality: N/A;  . EYE MUSCLE SURGERY Right    child  . ORIF CLAVICULAR FRACTURE Left 11/03/2016   Procedure: OPEN REDUCTION INTERNAL FIXATION (ORIF) CLAVICULAR FRACTURE;  Surgeon: Corky Mull, MD;  Location: [facility];  Service: Orthopedics;  Laterality: Left;  . removal of fibroid      Family History  Problem Relation Age of Onset  . Lupus Mother   . Hypertension Mother   . Lymphoma Mother 56       B cell; currently 51  . Hypertension Father        currently 59  . Diabetes Father   . Breast cancer Paternal Aunt 79       deceased 67  . Lung cancer Paternal Aunt 34       deceased 96  . Brain cancer Paternal Grandfather 63       deceased 17    Social History Social History   Tobacco Use  . Smoking status: Former Smoker    Packs/day: 1.00    Years: 15.00    Pack years: 15.00    Types: Cigarettes    Last attempt to quit: 02/21/2015    Years since quitting: 3.7  . Smokeless tobacco: Never Used  Substance Use Topics  . Alcohol use: Yes    Alcohol/week: 2.0 standard drinks    Types: 2 Cans of beer per week    Comment: Occasional   . Drug use: No    No Known Allergies  Current Outpatient Medications  Medication Sig Dispense Refill  . Aspirin-Acetaminophen-Caffeine (GOODY HEADACHE PO) Take by mouth as needed.    . hydrOXYzine (ATARAX/VISTARIL) 10 MG tablet TAKE  1 TABLET BY MOUTH EVERYDAY AT BEDTIME 30 tablet 5  . ibuprofen (ADVIL,MOTRIN) 200 MG tablet Take 400 mg by mouth every 6 (six) hours as needed for headache or moderate pain.    . Multiple Vitamin (MULTIVITAMIN) tablet Take 1 tablet by mouth daily.    Marland Kitchen omeprazole (PRILOSEC) 20 MG capsule Take 1 capsule (20 mg total) by mouth 2 (two) times daily before a meal. (Patient taking differently: Take 20 mg by mouth as needed. ) 60 capsule 1   No current facility-administered medications for this visit.      Review of Systems Full ROS  was asked and was negative except for the information on the HPI  Physical Exam Blood pressure 133/86, pulse 87, temperature 98.2 F (36.8 C), temperature source Skin, resp. rate 14, height _0  (1.575 m), weight 154 lb (69.9 kg), last menstrual period 10/28/2018, SpO2  99 %. CONSTITUTIONAL: NAD EYES: Pupils are equal, round, and reactive to light, Sclera are non-icteric. EARS, NOSE, MOUTH AND THROAT: The oropharynx is clear. The oral mucosa is pink and moist. Hearing is intact to voice. LYMPH NODES:  Lymph nodes in the neck are normal. RESPIRATORY:  Lungs are clear. There is normal respiratory effort, with equal breath sounds bilaterally, and without pathologic use of accessory muscles. CARDIOVASCULAR: Heart is regular without murmurs, gallops, or rubs. BREAST: 34 C, Ptosis grade I. 24 cm distance nipple to clavicle bilaterally. Both fairly symmetrical currently..There is a small nodule 1cm in size  12 o'clock on her left breast, 3 cms from nippler .No LAD. Normal skin and normal nipples.  GI: The abdomen is  soft, nontender, and nondistended. There are no palpable masses. There is no hepatosplenomegaly. There are normal bowel sounds in all quadrants. GU: Rectal deferred.   MUSCULOSKELETAL: Normal muscle strength and tone. No cyanosis or edema.   SKIN: Turgor is good and there are no pathologic skin lesions or ulcers. NEUROLOGIC: Motor and sensation is grossly normal. Cranial nerves are grossly intact. PSYCH:  Oriented to person, place and time. Affect is normal.  Data Reviewed  I have personally reviewed the patient's imaging, laboratory findings and medical records.    Assessment/Plan 47 year old female with recently diagnosed breast cancer on the left side ER/PR + her2 neg.  I had an extensive discussion with the patient about surgical options to include mastectomy versus partial mastectomy and radiation therapy.  She is self-conscious about the potential deformities that a partial mastectomy may bring.  She was interested in oncoplastic procedure and potentially mastopexy. Discussed the case in detail with Dr. Marla Roe  and have offered patient to see plastic surgery to see if we can perform an oncoplastic procedure with mastopexy and lift on the  contralateral side as well.   Also discussed with the patient detail the importance of good resection with negative margins as an oncologic procedure.  Understands that the main objective is to perform an oncologic resection with aesthetic pleasing results.  I also discussed with her in detail about the need for sentinel lymph node biopsy.  The role for adjuvant hormonal therapy and the potential risk of reexcision. With the patient in detail about the procedure that is a planned partial mastectomy with crescent mastopexy.  She will see Dr. Janalyn Shy and we will schedule her surgery promptly. I spent at least 60 minutes in this encounter w > 50% spent in coordination and counseling of her care. A copy of this report was sent to the referring provider.   Caroleen Hamman, MD  FACS General Surgeon 11/03/2018, 1:16 PM

## 2018-11-03 NOTE — Telephone Encounter (Signed)
Dr. Janese Banks is referring Melinda Holder for genetic counseling due to a personal and family history of cancer. This is a STAT referral for surgical decision-making. Her blood was already drawn in clinic and sent to [facility].  I left her a message to call and schedule this telegenetics visit to be done by phone and hope to hear back from her today.   Steele Berg, [place], [place] Genetic Counselor Phone: [phone]

## 2018-11-03 NOTE — Progress Notes (Signed)
Patient ID: Melinda Holder, female   DOB: 04-Jun-1971, 47 y.o.   MRN: [id number]  HPI Melinda Holder is a 47 y.o. female seen in consultation at the request of Dr. Janese Banks for newly diagnosed breast cancer on the left side. She reports having felt a small knot several months ago. No pain, no change in her nipple or skin on her breast, no nipple DC. Ultrasound and mammogram have been personally reviewed showing an abnormal area 1 cm x 1 cm 12:00 3 cm from the nipple in the left side.  Mammogram showing is invasive mammary carcinoma ER and PR positive and HER-2 negative. She is premenopausal. Menarche age 89. No hormonal replacement. Hx of thrombocytosis.  Peternal aunt w hx breast CA. Mother w Lymphoma. G0P0L0. Able to perform more than 4 METS of activity without any shortness of the chest pain.  She is concerned about potential deformity of her breast after partial mastectomy She does not smoke.  HPI  Past Medical History:  Diagnosis Date  . Amblyopia of eye, right   . Anemia   . Asthma    childhood  . Cancer ([place]) 10/27/2018   left breast  . Difficult intubation 11/03/2016   DL x 2 with MAC 3 (CRNA and MD), very anterior. DL x 2 with Glidescope #3 Lo pro, bougie utilized. Easy mask.  . Endometriosis     Past Surgical History:  Procedure Laterality Date  . ABLATION ON ENDOMETRIOSIS    . CLAVICLE SURGERY Left 10/2016  . COLONOSCOPY WITH PROPOFOL N/A 06/27/2018   Procedure: COLONOSCOPY WITH PROPOFOL;  Surgeon: Lucilla Lame, MD;  Location: [place];  Service: Endoscopy;  Laterality: N/A;  . ESOPHAGOGASTRODUODENOSCOPY (EGD) WITH PROPOFOL N/A 06/27/2018   Procedure: ESOPHAGOGASTRODUODENOSCOPY (EGD) WITH PROPOFOL;  Surgeon: Lucilla Lame, MD;  Location: [place];  Service: Endoscopy;  Laterality: N/A;  . EYE MUSCLE SURGERY Right    child  . ORIF CLAVICULAR FRACTURE Left 11/03/2016   Procedure: OPEN REDUCTION INTERNAL FIXATION (ORIF) CLAVICULAR FRACTURE;  Surgeon: Corky Mull, MD;  Location: [facility];  Service: Orthopedics;  Laterality: Left;  . removal of fibroid      Family History  Problem Relation Age of Onset  . Lupus Mother   . Hypertension Mother   . Lymphoma Mother 56       B cell; currently 51  . Hypertension Father        currently 59  . Diabetes Father   . Breast cancer Paternal Aunt 79       deceased 67  . Lung cancer Paternal Aunt 34       deceased 96  . Brain cancer Paternal Grandfather 63       deceased 17    Social History Social History   Tobacco Use  . Smoking status: Former Smoker    Packs/day: 1.00    Years: 15.00    Pack years: 15.00    Types: Cigarettes    Last attempt to quit: 02/21/2015    Years since quitting: 3.7  . Smokeless tobacco: Never Used  Substance Use Topics  . Alcohol use: Yes    Alcohol/week: 2.0 standard drinks    Types: 2 Cans of beer per week    Comment: Occasional   . Drug use: No    No Known Allergies  Current Outpatient Medications  Medication Sig Dispense Refill  . Aspirin-Acetaminophen-Caffeine (GOODY HEADACHE PO) Take by mouth as needed.    . hydrOXYzine (ATARAX/VISTARIL) 10 MG tablet TAKE  1 TABLET BY MOUTH EVERYDAY AT BEDTIME 30 tablet 5  . ibuprofen (ADVIL,MOTRIN) 200 MG tablet Take 400 mg by mouth every 6 (six) hours as needed for headache or moderate pain.    . Multiple Vitamin (MULTIVITAMIN) tablet Take 1 tablet by mouth daily.    Marland Kitchen omeprazole (PRILOSEC) 20 MG capsule Take 1 capsule (20 mg total) by mouth 2 (two) times daily before a meal. (Patient taking differently: Take 20 mg by mouth as needed. ) 60 capsule 1   No current facility-administered medications for this visit.      Review of Systems Full ROS  was asked and was negative except for the information on the HPI  Physical Exam Blood pressure 133/86, pulse 87, temperature 98.2 F (36.8 C), temperature source Skin, resp. rate 14, height _0  (1.575 m), weight 154 lb (69.9 kg), last menstrual period 10/28/2018, SpO2  99 %. CONSTITUTIONAL: NAD EYES: Pupils are equal, round, and reactive to light, Sclera are non-icteric. EARS, NOSE, MOUTH AND THROAT: The oropharynx is clear. The oral mucosa is pink and moist. Hearing is intact to voice. LYMPH NODES:  Lymph nodes in the neck are normal. RESPIRATORY:  Lungs are clear. There is normal respiratory effort, with equal breath sounds bilaterally, and without pathologic use of accessory muscles. CARDIOVASCULAR: Heart is regular without murmurs, gallops, or rubs. BREAST: 34 C, Ptosis grade I. 24 cm distance nipple to clavicle bilaterally. Both fairly symmetrical currently..There is a small nodule 1cm in size  12 o'clock on her left breast, 3 cms from nippler .No LAD. Normal skin and normal nipples.  GI: The abdomen is  soft, nontender, and nondistended. There are no palpable masses. There is no hepatosplenomegaly. There are normal bowel sounds in all quadrants. GU: Rectal deferred.   MUSCULOSKELETAL: Normal muscle strength and tone. No cyanosis or edema.   SKIN: Turgor is good and there are no pathologic skin lesions or ulcers. NEUROLOGIC: Motor and sensation is grossly normal. Cranial nerves are grossly intact. PSYCH:  Oriented to person, place and time. Affect is normal.  Data Reviewed  I have personally reviewed the patient's imaging, laboratory findings and medical records.    Assessment/Plan 47 year old female with recently diagnosed breast cancer on the left side ER/PR + her2 neg.  I had an extensive discussion with the patient about surgical options to include mastectomy versus partial mastectomy and radiation therapy.  She is self-conscious about the potential deformities that a partial mastectomy may bring.  She was interested in oncoplastic procedure and potentially mastopexy. Discussed the case in detail with Dr. Marla Roe  and have offered patient to see plastic surgery to see if we can perform an oncoplastic procedure with mastopexy and lift on the  contralateral side as well.   Also discussed with the patient detail the importance of good resection with negative margins as an oncologic procedure.  Understands that the main objective is to perform an oncologic resection with aesthetic pleasing results.  I also discussed with her in detail about the need for sentinel lymph node biopsy.  The role for adjuvant hormonal therapy and the potential risk of reexcision. With the patient in detail about the procedure that is a planned partial mastectomy with crescent mastopexy.  She will see Dr. Janalyn Shy and we will schedule her surgery promptly. I spent at least 60 minutes in this encounter w > 50% spent in coordination and counseling of her care. A copy of this report was sent to the referring provider.   Caroleen Hamman, MD  FACS General Surgeon 11/03/2018, 1:16 PM

## 2018-11-03 NOTE — Addendum Note (Signed)
Addended by: Dominga Ferry on: 11/03/2018 08:16 AM   Modules accepted: Orders

## 2018-11-03 NOTE — Progress Notes (Signed)
Patient's surgery has been tentatively scheduled for 11-11-18 at [facility] with Dr. Dahlia Byes. She is aware of surgery date and arrival time.   The patient is scheduled to see Dr. Marla Roe on 11-04-18 at 1:45 pm. Patient will contact the office Monday if she would like to coordinate surgery with Dr. Marla Roe. If so, she is aware surgery date will need to be changed as Dr. Marla Roe does not do surgery on Fridays.   The patient is aware to call the office should they have further questions.

## 2018-11-04 ENCOUNTER — Ambulatory Visit (INDEPENDENT_AMBULATORY_CARE_PROVIDER_SITE_OTHER): Payer: BLUE CROSS/BLUE SHIELD | Admitting: Plastic Surgery

## 2018-11-04 ENCOUNTER — Encounter: Payer: Self-pay | Admitting: Plastic Surgery

## 2018-11-04 VITALS — BP 128/76 | HR 92 | Ht 62.0 in | Wt 153.0 lb

## 2018-11-04 DIAGNOSIS — C50912 Malignant neoplasm of unspecified site of left female breast: Secondary | ICD-10-CM | POA: Diagnosis not present

## 2018-11-04 NOTE — H&P (View-Only) (Signed)
Patient ID: Melinda Holder, female    DOB: 1971-01-20, 47 y.o.   MRN: [id number]   Chief Complaint  Patient presents with  . Breast Cancer    The patient is a 47 year old female here for a consultation for breast reconstruction.  She was referred by Dr. Rosaria Ferries from [place].  She has a newly diagnosed left breast cancer that she had evaluated because she felt a small lump.  She underwent a ultrasound mammogram and then a biopsy which was positive for invasive mammary carcinoma (ER /PR positive and HER-2 negative).  She has a paternal aunt with a history of breast cancer and a mother with lymphoma.  Her preop bra size =36 C.  She has grade 1 ptosis of the breasts.  The left nipple areole is lower than the right.  She is planning on having a left partial mastectomy with postop radiation she does not want to be asymmetric.  She is interested in symmetry surgery.   Review of Systems  Constitutional: Negative.  Negative for activity change.  HENT: Negative.   Eyes: Negative.   Respiratory: Negative.  Negative for shortness of breath.   Cardiovascular: Negative.   Gastrointestinal: Negative.   Endocrine: Negative.   Genitourinary: Negative.   Musculoskeletal: Negative.  Negative for arthralgias and neck stiffness.  Skin: Negative.  Negative for color change and wound.  Neurological: Negative.   Hematological: Negative.   Psychiatric/Behavioral: Negative.     Past Medical History:  Diagnosis Date  . Amblyopia of eye, right   . Anemia   . Asthma    childhood  . Cancer ([place]) 10/27/2018   left breast  . Difficult intubation 11/03/2016   DL x 2 with MAC 3 (CRNA and MD), very anterior. DL x 2 with Glidescope #3 Lo pro, bougie utilized. Easy mask.  . Endometriosis     Past Surgical History:  Procedure Laterality Date  . ABLATION ON ENDOMETRIOSIS    . CLAVICLE SURGERY Left 10/2016  . COLONOSCOPY WITH PROPOFOL N/A 06/27/2018   Procedure: COLONOSCOPY WITH PROPOFOL;  Surgeon: Lucilla Lame, MD;  Location: [place];  Service: Endoscopy;  Laterality: N/A;  . ESOPHAGOGASTRODUODENOSCOPY (EGD) WITH PROPOFOL N/A 06/27/2018   Procedure: ESOPHAGOGASTRODUODENOSCOPY (EGD) WITH PROPOFOL;  Surgeon: Lucilla Lame, MD;  Location: [place];  Service: Endoscopy;  Laterality: N/A;  . EYE MUSCLE SURGERY Right    child  . ORIF CLAVICULAR FRACTURE Left 11/03/2016   Procedure: OPEN REDUCTION INTERNAL FIXATION (ORIF) CLAVICULAR FRACTURE;  Surgeon: Corky Mull, MD;  Location: [facility];  Service: Orthopedics;  Laterality: Left;  . removal of fibroid        Current Outpatient Medications:  .  acetaminophen (TYLENOL) 500 MG tablet, Take 1,000 mg by mouth 2 (two) times daily as needed for moderate pain or headache., Disp: , Rfl:  .  hydrOXYzine (ATARAX/VISTARIL) 10 MG tablet, TAKE 1 TABLET BY MOUTH EVERYDAY AT BEDTIME, Disp: 30 tablet, Rfl: 5 .  ibuprofen (ADVIL,MOTRIN) 200 MG tablet, Take 400 mg by mouth 2 (two) times daily as needed for headache or moderate pain. , Disp: , Rfl:  .  omeprazole (PRILOSEC) 20 MG capsule, Take 1 capsule (20 mg total) by mouth 2 (two) times daily before a meal. (Patient taking differently: Take 20 mg by mouth daily as needed (acid reflux). ), Disp: 60 capsule, Rfl: 1 .  polyethylene glycol (MIRALAX / GLYCOLAX) packet, Take 17 g by mouth daily as needed for moderate constipation., Disp: , Rfl:  Objective:   Vitals:   11/04/18 1124  BP: 128/76  Pulse: 92  SpO2: 97%    Physical Exam  Constitutional: She is oriented to person, place, and time. She appears well-developed and well-nourished.  HENT:  Head: Normocephalic and atraumatic.  Eyes: Pupils are equal, round, and reactive to light. EOM are normal.  Neck: Normal range of motion.  Cardiovascular: Normal rate.  Pulmonary/Chest: Effort normal.  Abdominal: Soft. She exhibits no distension. There is no tenderness.  Neurological: She is alert and oriented to person, place, and time.  Skin:  Skin is warm.  Psychiatric: She has a normal mood and affect. Her behavior is normal. Thought content normal.    Assessment & Plan:  Malignant neoplasm of left female breast, unspecified estrogen receptor status, unspecified site of breast (HCC)  Assessment and Plan:  A long, detailed conversation was had regarding the patient's options for breast reconstruction. Five main points, which are explained to all breast reconstruction patients, were discussed.  1. Breast reconstruction is an optional process.  2. Breast reconstruction is a multi-stage process which involves multiple surgeries spaced several months apart. The entire process can take over one year.  3. The major goal of breast reconstruction is to have the patient look normal in clothing. When naked, there will always be scars.  4. Asymmetries are often present during the reconstruction process. Several operations may be needed, including surgery to the non-cancerous breast, to achieve satisfactory results.  5. No matter the reconstructive method, there are ways that the reconstruction can fail and a secondary reconstructive plan would need to be created.   A general discussion regarding all available methods of breast reconstruction were discussed. The types of reconstructions described included.  1. Tissue expander and implant based reconstruction, both single and multi-stage approaches.  2. Autologous only reconstructions, including free abdominal-tissue based reconstructions.  3. Combination procedures, particularly latissismus dorsi flaps combined with either expanders or implants.  For each of the reconstruction methods mentioned above, the risks, benefits, alternatives, scarring, and recovery time were discussed in great detail. Specific risks detailed included bleeding, infection, hematoma, seroma, scarring, pain, wound healing complications, flap loss, fat necrosis, capsular contracture, need for implant removal, donor site  complications, bulge, hernia, umbilical necrosis, need for urgent reoperation, and need for dressing changes were discussed.   Once all reconstruction options were presented, a focused discussion was had regarding the patient's suitability for each of these procedures. A total of 45 minutes of face-to-face time was spent in this encounter, of which >50% was spent in counseling.    She is interested in reconstruction with a reduction tissue rearrangement on the left breast at the same time as the partial mastectomy.  Then she will have her radiation and we will plan on the right breast reduction mastopexy to achieve symmetry once radiation is finished.  I called Dr. Rosaria Ferries and we discussed the above plan.    [place], DO

## 2018-11-04 NOTE — Progress Notes (Signed)
Patient ID: Melinda Holder, female    DOB: 1971-10-15, 47 y.o.   MRN: [id number]   Chief Complaint  Patient presents with  . Breast Cancer    The patient is a 47 year old female here for a consultation for breast reconstruction.  She was referred by Dr. Rosaria Ferries from [place].  She has a newly diagnosed left breast cancer that she had evaluated because she felt a small lump.  She underwent a ultrasound mammogram and then a biopsy which was positive for invasive mammary carcinoma (ER /PR positive and HER-2 negative).  She has a paternal aunt with a history of breast cancer and a mother with lymphoma.  Her preop bra size =36 C.  She has grade 1 ptosis of the breasts.  The left nipple areole is lower than the right.  She is planning on having a left partial mastectomy with postop radiation she does not want to be asymmetric.  She is interested in symmetry surgery.   Review of Systems  Constitutional: Negative.  Negative for activity change.  HENT: Negative.   Eyes: Negative.   Respiratory: Negative.  Negative for shortness of breath.   Cardiovascular: Negative.   Gastrointestinal: Negative.   Endocrine: Negative.   Genitourinary: Negative.   Musculoskeletal: Negative.  Negative for arthralgias and neck stiffness.  Skin: Negative.  Negative for color change and wound.  Neurological: Negative.   Hematological: Negative.   Psychiatric/Behavioral: Negative.     Past Medical History:  Diagnosis Date  . Amblyopia of eye, right   . Anemia   . Asthma    childhood  . Cancer ([place]) 10/27/2018   left breast  . Difficult intubation 11/03/2016   DL x 2 with MAC 3 (CRNA and MD), very anterior. DL x 2 with Glidescope #3 Lo pro, bougie utilized. Easy mask.  . Endometriosis     Past Surgical History:  Procedure Laterality Date  . ABLATION ON ENDOMETRIOSIS    . CLAVICLE SURGERY Left 10/2016  . COLONOSCOPY WITH PROPOFOL N/A 06/27/2018   Procedure: COLONOSCOPY WITH PROPOFOL;  Surgeon: Lucilla Lame, MD;  Location: [facility];  Service: Endoscopy;  Laterality: N/A;  . ESOPHAGOGASTRODUODENOSCOPY (EGD) WITH PROPOFOL N/A 06/27/2018   Procedure: ESOPHAGOGASTRODUODENOSCOPY (EGD) WITH PROPOFOL;  Surgeon: Lucilla Lame, MD;  Location: [facility];  Service: Endoscopy;  Laterality: N/A;  . EYE MUSCLE SURGERY Right    child  . ORIF CLAVICULAR FRACTURE Left 11/03/2016   Procedure: OPEN REDUCTION INTERNAL FIXATION (ORIF) CLAVICULAR FRACTURE;  Surgeon: Corky Mull, MD;  Location: [facility];  Service: Orthopedics;  Laterality: Left;  . removal of fibroid        Current Outpatient Medications:  .  acetaminophen (TYLENOL) 500 MG tablet, Take 1,000 mg by mouth 2 (two) times daily as needed for moderate pain or headache., Disp: , Rfl:  .  hydrOXYzine (ATARAX/VISTARIL) 10 MG tablet, TAKE 1 TABLET BY MOUTH EVERYDAY AT BEDTIME, Disp: 30 tablet, Rfl: 5 .  ibuprofen (ADVIL,MOTRIN) 200 MG tablet, Take 400 mg by mouth 2 (two) times daily as needed for headache or moderate pain. , Disp: , Rfl:  .  omeprazole (PRILOSEC) 20 MG capsule, Take 1 capsule (20 mg total) by mouth 2 (two) times daily before a meal. (Patient taking differently: Take 20 mg by mouth daily as needed (acid reflux). ), Disp: 60 capsule, Rfl: 1 .  polyethylene glycol (MIRALAX / GLYCOLAX) packet, Take 17 g by mouth daily as needed for moderate constipation., Disp: , Rfl:  Objective:   Vitals:   11/04/18 1124  BP: 128/76  Pulse: 92  SpO2: 97%    Physical Exam  Constitutional: She is oriented to person, place, and time. She appears well-developed and well-nourished.  HENT:  Head: Normocephalic and atraumatic.  Eyes: Pupils are equal, round, and reactive to light. EOM are normal.  Neck: Normal range of motion.  Cardiovascular: Normal rate.  Pulmonary/Chest: Effort normal.  Abdominal: Soft. She exhibits no distension. There is no tenderness.  Neurological: She is alert and oriented to person, place, and time.  Skin:  Skin is warm.  Psychiatric: She has a normal mood and affect. Her behavior is normal. Thought content normal.    Assessment & Plan:  Malignant neoplasm of left female breast, unspecified estrogen receptor status, unspecified site of breast (HCC)  Assessment and Plan:  A long, detailed conversation was had regarding the patient's options for breast reconstruction. Five main points, which are explained to all breast reconstruction patients, were discussed.  1. Breast reconstruction is an optional process.  2. Breast reconstruction is a multi-stage process which involves multiple surgeries spaced several months apart. The entire process can take over one year.  3. The major goal of breast reconstruction is to have the patient look normal in clothing. When naked, there will always be scars.  4. Asymmetries are often present during the reconstruction process. Several operations may be needed, including surgery to the non-cancerous breast, to achieve satisfactory results.  5. No matter the reconstructive method, there are ways that the reconstruction can fail and a secondary reconstructive plan would need to be created.   A general discussion regarding all available methods of breast reconstruction were discussed. The types of reconstructions described included.  1. Tissue expander and implant based reconstruction, both single and multi-stage approaches.  2. Autologous only reconstructions, including free abdominal-tissue based reconstructions.  3. Combination procedures, particularly latissismus dorsi flaps combined with either expanders or implants.  For each of the reconstruction methods mentioned above, the risks, benefits, alternatives, scarring, and recovery time were discussed in great detail. Specific risks detailed included bleeding, infection, hematoma, seroma, scarring, pain, wound healing complications, flap loss, fat necrosis, capsular contracture, need for implant removal, donor site  complications, bulge, hernia, umbilical necrosis, need for urgent reoperation, and need for dressing changes were discussed.   Once all reconstruction options were presented, a focused discussion was had regarding the patient's suitability for each of these procedures. A total of 45 minutes of face-to-face time was spent in this encounter, of which >50% was spent in counseling.    She is interested in reconstruction with a reduction tissue rearrangement on the left breast at the same time as the partial mastectomy.  Then she will have her radiation and we will plan on the right breast reduction mastopexy to achieve symmetry once radiation is finished.  I called Dr. Rosaria Ferries and we discussed the above plan.    [place], DO

## 2018-11-07 ENCOUNTER — Encounter: Payer: Self-pay | Admitting: *Deleted

## 2018-11-07 NOTE — Progress Notes (Signed)
Patient would like immediate construction with Dr. Marla Roe at the time of left breast lumpectomy.   Surgery will be moved from 11-11-18 to 11-17-18 to accommodate patient's request.   Melinda Holder at Dr. Eusebio Friendly office to arrange their portion of the surgery.   Patient will be contacted with new arrival time day of surgery once surgery has been rescheduled.   Phone interview date will remain the same. Patient aware to be expecting a call on 11-08-18 in the afternoon.

## 2018-11-08 ENCOUNTER — Encounter: Payer: Self-pay | Admitting: *Deleted

## 2018-11-08 ENCOUNTER — Other Ambulatory Visit: Payer: Self-pay

## 2018-11-08 ENCOUNTER — Telehealth: Payer: Self-pay

## 2018-11-08 ENCOUNTER — Encounter
Admission: RE | Admit: 2018-11-08 | Discharge: 2018-11-08 | Disposition: A | Discharge status: Home / Self Care | Payer: BLUE CROSS/BLUE SHIELD | Source: Ambulatory Visit | Attending: Surgery | Admitting: Surgery

## 2018-11-08 NOTE — Telephone Encounter (Signed)
The patient will arrive at the [facility] on 11/17/18, the day of surgery at 8:20 am. She is aware of date, time, and location.

## 2018-11-08 NOTE — Patient Instructions (Signed)
Your procedure is scheduled on: 11-17-18 THURSDAY Report to [facility] @ 8:20 AM  Remember: Instructions that are not followed completely may result in serious medical risk, up to and including death, or upon the discretion of your surgeon and anesthesiologist your surgery may need to be rescheduled.    _x___ 1. Do not eat food after midnight the night before your procedure. NO GUM OR CANDY AFTER MIDNIGHT.  You may drink clear liquids up to 2 hours before you are scheduled to arrive at the hospital for your procedure.  Do not drink clear liquids within 2 hours of your scheduled arrival to the hospital.  Clear liquids include  --Water or Apple juice without pulp  --Clear carbohydrate beverage such as ClearFast or Gatorade  --Black Coffee or Clear Tea (No milk, no creamers, do not add anything to the coffee or Tea   ____Ensure clear carbohydrate drink on the way to the hospital for bariatric patients  ____Ensure clear carbohydrate drink 3 hours before surgery for Dr Dwyane Luo patients if physician instructed.     __x__ 2. No Alcohol for 24 hours before or after surgery.   __x__3. No Smoking or e-cigarettes for 24 prior to surgery.  Do not use any chewable tobacco products for at least 6 hour prior to surgery   ____  4. Bring all medications with you on the day of surgery if instructed.    __x__ 5. Notify your doctor if there is any change in your medical condition     (cold, fever, infections).    x___6. On the morning of surgery brush your teeth with toothpaste and water.  You may rinse your mouth with mouth wash if you wish.  Do not swallow any toothpaste or mouthwash.   Do not wear jewelry, make-up, hairpins, clips or nail polish.  Do not wear lotions, powders, or perfumes. You may wear deodorant.  Do not shave 48 hours prior to surgery. Men may shave face and neck.  Do not bring valuables to the hospital.    [facility] is not responsible for any belongings or valuables.        Contacts, dentures or bridgework may not be worn into surgery.  Leave your suitcase in the car. After surgery it may be brought to your room.  For patients admitted to the hospital, discharge time is determined by your treatment team.  _  Patients discharged the day of surgery will not be allowed to drive home.  You will need someone to drive you home and stay with you the night of your procedure.    Please read over the following fact sheets that you were given:   [facility] Preparing for Surgery   _x___ TAKE THE FOLLOWING MEDICATION THE MORNING OF SURGERY WITH A SMALL SIP OF WATER. These include:  1. PRILOSEC (OMEPRAZOLE)  2. TAKE A PRILOSEC THE NIGHT BEFORE YOUR SURGERY  3.  4.  5.  6.  ____Fleets enema or Magnesium Citrate as directed.   _x___ Use CHG Soap or sage wipes as directed on instruction sheet   ____ Use inhalers on the day of surgery and bring to hospital day of surgery  ____ Stop Metformin and Janumet 2 days prior to surgery.    ____ Take 1/2 of usual insulin dose the night before surgery and none on the morning surgery.   ____ Follow recommendations from Cardiologist, Pulmonologist or PCP regarding stopping Aspirin, Coumadin, Plavix ,Eliquis, Effient, or Pradaxa, and Pletal.  X____Stop Anti-inflammatories such as Advil, Aleve,  Ibuprofen, Motrin, Naproxen, Naprosyn, Goodies powders or aspirin products NOW-OK to take Tylenol    ____ Stop supplements until after surgery.     ____ Bring C-Pap to the hospital.

## 2018-11-09 ENCOUNTER — Encounter
Admission: RE | Admit: 2018-11-09 | Discharge: 2018-11-09 | Disposition: A | Discharge status: Home / Self Care | Payer: BLUE CROSS/BLUE SHIELD | Source: Ambulatory Visit | Attending: Surgery | Admitting: Surgery

## 2018-11-09 ENCOUNTER — Telehealth: Payer: Self-pay | Admitting: *Deleted

## 2018-11-09 DIAGNOSIS — Z01812 Encounter for preprocedural laboratory examination: Secondary | ICD-10-CM | POA: Diagnosis not present

## 2018-11-09 DIAGNOSIS — A048 Other specified bacterial intestinal infections: Secondary | ICD-10-CM | POA: Diagnosis not present

## 2018-11-09 LAB — CBC
HCT: 41 % (ref 36.0–46.0)
Hemoglobin: 13.6 g/dL (ref 12.0–15.0)
MCH: 31.3 pg (ref 26.0–34.0)
MCHC: 33.2 g/dL (ref 30.0–36.0)
MCV: 94.5 fL (ref 80.0–100.0)
Platelets: 582 10*3/uL — ABNORMAL HIGH (ref 150–400)
RBC: 4.34 MIL/uL (ref 3.87–5.11)
RDW: 13.2 % (ref 11.5–15.5)
WBC: 6.4 10*3/uL (ref 4.0–10.5)
nRBC: 0 % (ref 0.0–0.2)

## 2018-11-09 NOTE — Telephone Encounter (Signed)
rcvd patient message that she is having surgery 11/21 and dr. Janese Banks wanted to see pt in 2 weeks from surgery date. I made her appt 12/01/2018 at 10:15 and I left this info on her voicemail. And asked her to call me if she has questions or if appt date and time does not work for her

## 2018-11-11 ENCOUNTER — Ambulatory Visit: Payer: BLUE CROSS/BLUE SHIELD

## 2018-11-11 ENCOUNTER — Telehealth: Payer: Self-pay

## 2018-11-11 LAB — H. PYLORI ANTIGEN, STOOL: H PYLORI AG STL: NEGATIVE

## 2018-11-11 NOTE — Telephone Encounter (Signed)
LVM for pt to return my call and results of h pylori on her voicemail.

## 2018-11-11 NOTE — Telephone Encounter (Signed)
-----   Message from Lucilla Lame, MD sent at 11/11/2018 10:57 AM EST ----- Let the patient know the H. Pylori was negative.

## 2018-11-14 ENCOUNTER — Telehealth: Payer: Self-pay | Admitting: Genetic Counselor

## 2018-11-14 NOTE — Telephone Encounter (Signed)
Cancer Genetics             Telegenetics Results Disclosure   Patient Name: Melinda Holder Patient DOB: 12-05-1971 Patient Age: 47 y.o. Phone Call Date: 11/14/2018  Referring Provider: Randa Evens, MD    Ms. Ehrsam was called today to discuss genetic test results. Please see the Genetics telephone note from 11/03/18 for a detailed discussion of her personal and family histories and the recommendations provided.  Genetic Testing: At the time of Ms. Cregg's telegenetics visit, she decided to pursue genetic testing of multiple genes associated with hereditary susceptibility to cancer. Testing included sequencing and deletion/duplication analysis. Testing did not reveal a pathogenic mutation in any of the genes analyzed.  A copy of the genetic test report will be scanned into Epic under the Media tab.  Interpretation: These results suggest that Ms. Faiella's cancer was most likely not due to an inherited predisposition. Most cancers happen by chance and this test, along with details of her family history, suggests that her cancer falls into this category.  Since the current test is not perfect, it is possible that there may be a gene mutation that current testing cannot detect, but that chance is small. It is possible that a different genetic factor, which has not yet been discovered or is not on this panel, is responsible for the cancer diagnoses in the family. Again, the likelihood of this is low. No additional testing is recommended at this time for Ms. Britta Mccreedy.  Genes Analyzed: The genes analyzed were the 84 genes on [facility]'s Multi-Cancer panel (AIP, ALK, APC, ATM, AXIN2, BAP1, BARD1, BLM, BMPR1A, BRCA1, BRCA2, BRIP1, CASR, CDC73, CDH1, CDK4, CDKN1B, CDKN1C, CDKN2A, CEBPA, CHEK2, CTNNA1, DICER1, DIS3L2, EGFR, EPCAM, FH, FLCN, GATA2, GPC3, GREM1, HOXB13, HRAS, KIT, MAX, MEN1, MET, MITF, MLH1, MSH2, MSH3, MSH6, MUTYH, NBN, NF1, NF2, NTHL1, PALB2, PDGFRA, PHOX2B,  PMS2, POLD1, POLE, POT1, PRKAR1A, PTCH1, PTEN, RAD50, RAD51C, RAD51D, RB1, RECQL4, RET, RUNX1, SDHA, SDHAF2, SDHB, SDHC, SDHD, SMAD4, SMARCA4, SMARCB1, SMARCE1, STK11, SUFU, TERC, TERT, TMEM127, TP53, TSC1, TSC2, VHL, WRN, WT1).  Cancer Screening: Ms. Pant is recommended to follow the cancer screening guidelines provided by her physicians.   Family Members: Given the young age of breast cancer in the family (age 64 in Ms. Seefeld and 53 in her paternal aunt), women are recommended to speak with their own providers about having initiating breast imaging beginning at age 32-36, which is 33 years earlier than the youngest breast cancer in the family.  Any relative who had cancer at a young age or had a particularly rare cancer may also wish to pursue genetic testing. Genetic counselors can be located in other cities, by visiting the website of the [employer] ([employer]) and Field seismologist for a Dietitian by zip code.   Follow-Up: Cancer genetics is a rapidly advancing field and it is possible that new genetic tests will be appropriate for Ms. Awadallah in the future. Ms. Holtrop is encouraged to remain in contact with Genetics on an annual basis so we can update her personal and family histories, and let her know of advances in cancer genetics that may benefit the family. Ms. Reckart questions were answered to her satisfaction today, and she knows she is welcome to call anytime with additional questions.    Steele Berg, MS, [place] Certified Genetic Counselor phone: [phone]

## 2018-11-15 ENCOUNTER — Ambulatory Visit: Payer: BLUE CROSS/BLUE SHIELD | Admitting: Oncology

## 2018-11-17 ENCOUNTER — Other Ambulatory Visit: Payer: Self-pay

## 2018-11-17 ENCOUNTER — Encounter
Admission: RE | Disposition: A | Discharge status: Home / Self Care | Payer: Self-pay | Source: Ambulatory Visit | Attending: Surgery

## 2018-11-17 ENCOUNTER — Ambulatory Visit
Admission: RE | Admit: 2018-11-17 | Discharge: 2018-11-17 | Disposition: A | Discharge status: Home / Self Care | Payer: BLUE CROSS/BLUE SHIELD | Source: Ambulatory Visit | Attending: Surgery | Admitting: Surgery

## 2018-11-17 ENCOUNTER — Ambulatory Visit: Payer: BLUE CROSS/BLUE SHIELD | Admitting: Anesthesiology

## 2018-11-17 DIAGNOSIS — Z87891 Personal history of nicotine dependence: Secondary | ICD-10-CM | POA: Diagnosis not present

## 2018-11-17 DIAGNOSIS — K219 Gastro-esophageal reflux disease without esophagitis: Secondary | ICD-10-CM | POA: Diagnosis not present

## 2018-11-17 DIAGNOSIS — C50919 Malignant neoplasm of unspecified site of unspecified female breast: Secondary | ICD-10-CM | POA: Diagnosis not present

## 2018-11-17 DIAGNOSIS — Z803 Family history of malignant neoplasm of breast: Secondary | ICD-10-CM | POA: Insufficient documentation

## 2018-11-17 DIAGNOSIS — C50812 Malignant neoplasm of overlapping sites of left female breast: Secondary | ICD-10-CM | POA: Diagnosis not present

## 2018-11-17 DIAGNOSIS — Z170 Estrogen receptor positive status [ER+]: Principal | ICD-10-CM

## 2018-11-17 DIAGNOSIS — C50912 Malignant neoplasm of unspecified site of left female breast: Secondary | ICD-10-CM

## 2018-11-17 DIAGNOSIS — R928 Other abnormal and inconclusive findings on diagnostic imaging of breast: Secondary | ICD-10-CM | POA: Diagnosis not present

## 2018-11-17 DIAGNOSIS — D649 Anemia, unspecified: Secondary | ICD-10-CM | POA: Diagnosis not present

## 2018-11-17 HISTORY — PX: SENTINEL NODE BIOPSY: SHX6608

## 2018-11-17 HISTORY — PX: BREAST LUMPECTOMY WITH NEEDLE LOCALIZATION: SHX5759

## 2018-11-17 HISTORY — DX: Gastro-esophageal reflux disease without esophagitis: K21.9

## 2018-11-17 HISTORY — PX: BREAST LUMPECTOMY: SHX2

## 2018-11-17 LAB — POCT PREGNANCY, URINE: PREG TEST UR: NEGATIVE

## 2018-11-17 SURGERY — BREAST LUMPECTOMY WITH NEEDLE LOCALIZATION
Anesthesia: General | Laterality: Left

## 2018-11-17 MED ORDER — CEFAZOLIN SODIUM-DEXTROSE 2-4 GM/100ML-% IV SOLN
2.0000 g | INTRAVENOUS | Status: AC
  Administered 2018-11-17: 2 g via INTRAVENOUS

## 2018-11-17 MED ORDER — DEXAMETHASONE SODIUM PHOSPHATE 10 MG/ML IJ SOLN
INTRAMUSCULAR | Status: DC | PRN
  Administered 2018-11-17: 10 mg via INTRAVENOUS

## 2018-11-17 MED ORDER — ONDANSETRON HCL 4 MG/2ML IJ SOLN
INTRAMUSCULAR | Status: AC
  Filled 2018-11-17: qty 2

## 2018-11-17 MED ORDER — MIDAZOLAM HCL 2 MG/2ML IJ SOLN
INTRAMUSCULAR | Status: AC
  Filled 2018-11-17: qty 2

## 2018-11-17 MED ORDER — PHENYLEPHRINE HCL 10 MG/ML IJ SOLN
INTRAMUSCULAR | Status: DC | PRN
  Administered 2018-11-17: 100 ug via INTRAVENOUS
  Administered 2018-11-17: 200 ug via INTRAVENOUS
  Administered 2018-11-17 (×3): 100 ug via INTRAVENOUS
  Administered 2018-11-17 (×2): 200 ug via INTRAVENOUS

## 2018-11-17 MED ORDER — TECHNETIUM TC 99M SULFUR COLLOID FILTERED
1.0000 | Freq: Once | INTRAVENOUS | Status: AC | PRN
  Administered 2018-11-17: 0.761 via INTRADERMAL

## 2018-11-17 MED ORDER — SODIUM CHLORIDE 0.9 % IV SOLN: 250.0000 mL | INTRAVENOUS | Status: DC | PRN

## 2018-11-17 MED ORDER — FENTANYL CITRATE (PF) 100 MCG/2ML IJ SOLN: 25.0000 ug | INTRAMUSCULAR | Status: DC | PRN

## 2018-11-17 MED ORDER — ONDANSETRON HCL 4 MG/2ML IJ SOLN: 4.0000 mg | Freq: Once | INTRAMUSCULAR | Status: DC | PRN

## 2018-11-17 MED ORDER — ACETAMINOPHEN 650 MG RE SUPP: 650.0000 mg | RECTAL | Status: DC | PRN

## 2018-11-17 MED ORDER — FENTANYL CITRATE (PF) 250 MCG/5ML IJ SOLN
INTRAMUSCULAR | Status: AC
  Filled 2018-11-17: qty 5

## 2018-11-17 MED ORDER — CHLORHEXIDINE GLUCONATE CLOTH 2 % EX PADS: 6.0000 | MEDICATED_PAD | Freq: Once | CUTANEOUS | Status: DC

## 2018-11-17 MED ORDER — DEXMEDETOMIDINE HCL IN NACL 80 MCG/20ML IV SOLN
INTRAVENOUS | Status: AC
  Filled 2018-11-17: qty 20

## 2018-11-17 MED ORDER — PROPOFOL 10 MG/ML IV BOLUS
INTRAVENOUS | Status: AC
  Filled 2018-11-17: qty 40

## 2018-11-17 MED ORDER — DEXMEDETOMIDINE HCL IN NACL 200 MCG/50ML IV SOLN
INTRAVENOUS | Status: DC | PRN
  Administered 2018-11-17: 8 ug via INTRAVENOUS

## 2018-11-17 MED ORDER — ACETAMINOPHEN 325 MG PO TABS: 650.0000 mg | ORAL_TABLET | ORAL | Status: DC | PRN

## 2018-11-17 MED ORDER — FENTANYL CITRATE (PF) 100 MCG/2ML IJ SOLN
INTRAMUSCULAR | Status: DC | PRN
  Administered 2018-11-17: 50 ug via INTRAVENOUS
  Administered 2018-11-17: 100 ug via INTRAVENOUS
  Administered 2018-11-17 (×2): 50 ug via INTRAVENOUS

## 2018-11-17 MED ORDER — SODIUM CHLORIDE 0.9% FLUSH: 3.0000 mL | INTRAVENOUS | Status: DC | PRN

## 2018-11-17 MED ORDER — PROPOFOL 10 MG/ML IV BOLUS
INTRAVENOUS | Status: DC | PRN
  Administered 2018-11-17: 160 mg via INTRAVENOUS

## 2018-11-17 MED ORDER — CEPHALEXIN 500 MG PO CAPS: 500.0000 mg | ORAL_CAPSULE | Freq: Four times a day (QID) | ORAL | 0 refills | Status: AC

## 2018-11-17 MED ORDER — ONDANSETRON HCL 4 MG PO TABS: 4.0000 mg | ORAL_TABLET | Freq: Three times a day (TID) | ORAL | 0 refills | Status: AC | PRN

## 2018-11-17 MED ORDER — HYDROCODONE-ACETAMINOPHEN 5-325 MG PO TABS: 1.0000 | ORAL_TABLET | Freq: Four times a day (QID) | ORAL | 0 refills | Status: DC | PRN

## 2018-11-17 MED ORDER — SODIUM CHLORIDE 0.9% FLUSH: 3.0000 mL | Freq: Two times a day (BID) | INTRAVENOUS | Status: DC

## 2018-11-17 MED ORDER — OXYCODONE HCL 5 MG PO TABS: 5.0000 mg | ORAL_TABLET | ORAL | Status: DC | PRN

## 2018-11-17 MED ORDER — ONDANSETRON HCL 4 MG/2ML IJ SOLN
INTRAMUSCULAR | Status: DC | PRN
  Administered 2018-11-17: 4 mg via INTRAVENOUS

## 2018-11-17 MED ORDER — DEXAMETHASONE SODIUM PHOSPHATE 10 MG/ML IJ SOLN
INTRAMUSCULAR | Status: AC
  Filled 2018-11-17: qty 1

## 2018-11-17 MED ORDER — LACTATED RINGERS IV SOLN
INTRAVENOUS | Status: DC
  Administered 2018-11-17: 15:00:00 via INTRAVENOUS

## 2018-11-17 MED ORDER — CEFAZOLIN SODIUM-DEXTROSE 2-4 GM/100ML-% IV SOLN
INTRAVENOUS | Status: AC
  Filled 2018-11-17: qty 100

## 2018-11-17 MED ORDER — ACETAMINOPHEN 10 MG/ML IV SOLN
INTRAVENOUS | Status: AC
  Filled 2018-11-17: qty 100

## 2018-11-17 MED ORDER — LIDOCAINE-EPINEPHRINE 1 %-1:100000 IJ SOLN
INTRAMUSCULAR | Status: DC | PRN
  Administered 2018-11-17: 7 mL

## 2018-11-17 MED ORDER — SEVOFLURANE IN SOLN
RESPIRATORY_TRACT | Status: AC
  Filled 2018-11-17: qty 250

## 2018-11-17 MED ORDER — ACETAMINOPHEN 10 MG/ML IV SOLN
INTRAVENOUS | Status: DC | PRN
  Administered 2018-11-17: 1000 mg via INTRAVENOUS

## 2018-11-17 MED ORDER — LIDOCAINE HCL (CARDIAC) PF 100 MG/5ML IV SOSY
PREFILLED_SYRINGE | INTRAVENOUS | Status: DC | PRN
  Administered 2018-11-17: 100 mg via INTRAVENOUS

## 2018-11-17 MED ORDER — MIDAZOLAM HCL 2 MG/2ML IJ SOLN
INTRAMUSCULAR | Status: DC | PRN
  Administered 2018-11-17 (×2): 1 mg via INTRAVENOUS

## 2018-11-17 SURGICAL SUPPLY — 79 items
APPLIER CLIP 13 LRG OPEN (CLIP) ×3
APPLIER CLIP 9.375 SM OPEN (CLIP) ×3
BAG DECANTER FOR FLEXI CONT (MISCELLANEOUS) ×1 IMPLANT
BINDER BREAST LRG (GAUZE/BANDAGES/DRESSINGS) IMPLANT
BINDER BREAST MEDIUM (GAUZE/BANDAGES/DRESSINGS) ×2 IMPLANT
BINDER BREAST XLRG (GAUZE/BANDAGES/DRESSINGS) IMPLANT
BINDER BREAST XXLRG (GAUZE/BANDAGES/DRESSINGS) IMPLANT
BIOPATCH WHT 1IN DISK W/4.0 H (GAUZE/BANDAGES/DRESSINGS) ×2 IMPLANT
BLADE BOVIE TIP EXT 4 (BLADE) ×1 IMPLANT
BLADE SURG 15 STRL LF DISP TIS (BLADE) ×1 IMPLANT
BLADE SURG 15 STRL SS (BLADE)
BNDG GAUZE 4.5X4.1 6PLY STRL (MISCELLANEOUS) ×2 IMPLANT
BULB RESERV EVAC DRAIN JP 100C (MISCELLANEOUS) ×2 IMPLANT
CANISTER SUCT 1200ML W/VALVE (MISCELLANEOUS) ×4 IMPLANT
CHLORAPREP W/TINT 26ML (MISCELLANEOUS) ×5 IMPLANT
CLIP APPLIE 13 LRG OPEN (CLIP) IMPLANT
CLIP APPLIE 9.375 SM OPEN (CLIP) IMPLANT
COVER WAND RF STERILE (DRAPES) ×3 IMPLANT
DECANTER SPIKE VIAL GLASS SM (MISCELLANEOUS) IMPLANT
DERMABOND ADVANCED (GAUZE/BANDAGES/DRESSINGS) ×4
DERMABOND ADVANCED .7 DNX12 (GAUZE/BANDAGES/DRESSINGS) ×4 IMPLANT
DRAIN CHANNEL 19F RND (DRAIN) ×2 IMPLANT
DRAPE CHEST BREAST 77X106 FENE (MISCELLANEOUS) ×3 IMPLANT
DRAPE SHEET LG 3/4 BI-LAMINATE (DRAPES) ×2 IMPLANT
ELECT CAUTERY BLADE 6.4 (BLADE) ×4 IMPLANT
ELECT CAUTERY BLADE TIP 2.5 (TIP)
ELECT REM PT RETURN 9FT ADLT (ELECTROSURGICAL) ×3
ELECTRODE CAUTERY BLDE TIP 2.5 (TIP) ×1 IMPLANT
ELECTRODE REM PT RTRN 9FT ADLT (ELECTROSURGICAL) ×1 IMPLANT
GAUZE SPONGE 4X4 12PLY STRL (GAUZE/BANDAGES/DRESSINGS) ×1 IMPLANT
GLOVE BIO SURGEON STRL SZ 6.5 (GLOVE) ×4 IMPLANT
GLOVE BIO SURGEON STRL SZ7 (GLOVE) ×3 IMPLANT
GLOVE BIO SURGEONS STRL SZ 6.5 (GLOVE)
GLOVE INDICATOR 7.5 STRL GRN (GLOVE) ×3 IMPLANT
GOWN STRL REUS W/ TWL LRG LVL3 (GOWN DISPOSABLE) ×6 IMPLANT
GOWN STRL REUS W/TWL LRG LVL3 (GOWN DISPOSABLE) ×4
HANDLE YANKAUER SUCT BULB TIP (MISCELLANEOUS) ×2 IMPLANT
IV NS 1000ML (IV SOLUTION)
IV NS 1000ML BAXH (IV SOLUTION) ×1 IMPLANT
IV NS 500ML (IV SOLUTION)
IV NS 500ML BAXH (IV SOLUTION) ×1 IMPLANT
KIT TURNOVER KIT A (KITS) ×3 IMPLANT
MARGIN MAP 10MM (MISCELLANEOUS) ×3 IMPLANT
NDL 21 GA WING INFUSION (NEEDLE) ×1 IMPLANT
NDL HYPO 25X1 1.5 SAFETY (NEEDLE) IMPLANT
NEEDLE 21 GA WING INFUSION (NEEDLE) IMPLANT
NEEDLE HYPO 22GX1.5 SAFETY (NEEDLE) ×3 IMPLANT
NEEDLE HYPO 25X1 1.5 SAFETY (NEEDLE) IMPLANT
PACK BASIN MAJOR ARMC (MISCELLANEOUS) ×3 IMPLANT
PACK BASIN MINOR ARMC (MISCELLANEOUS) ×1 IMPLANT
PACK UNIVERSAL (MISCELLANEOUS) ×1 IMPLANT
PAD ABD DERMACEA PRESS 5X9 (GAUZE/BANDAGES/DRESSINGS) ×8 IMPLANT
PIN SAFETY STRL (MISCELLANEOUS) ×1 IMPLANT
SET ASEPTIC TRANSFER (MISCELLANEOUS) ×3 IMPLANT
SPONGE KITTNER 5P (MISCELLANEOUS) ×2 IMPLANT
SPONGE LAP 18X18 RF (DISPOSABLE) ×4 IMPLANT
SUT MNCRL 3-0 UNDYED SH (SUTURE) ×2 IMPLANT
SUT MNCRL 4-0 (SUTURE) ×4
SUT MNCRL 4-0 27XMFL (SUTURE) ×2
SUT MNCRL AB 3-0 PS2 27 (SUTURE) ×4 IMPLANT
SUT MNCRL AB 4-0 PS2 18 (SUTURE) ×2 IMPLANT
SUT MNCRL+ 5-0 UNDYED PC-3 (SUTURE) ×3 IMPLANT
SUT MONOCRYL 3-0 UNDYED (SUTURE)
SUT MONOCRYL 5-0 (SUTURE)
SUT PDS AB 1 CT1 27 (SUTURE) ×1 IMPLANT
SUT PDS PLUS 2 (SUTURE)
SUT PDS PLUS AB 2-0 CT-1 (SUTURE) ×6 IMPLANT
SUT SILK 2 0 SH (SUTURE) ×3 IMPLANT
SUT SILK 4 0 SH (SUTURE) ×2 IMPLANT
SUT VIC AB 2-0 SH 27 (SUTURE) ×4
SUT VIC AB 2-0 SH 27XBRD (SUTURE) ×2 IMPLANT
SUT VIC AB 3-0 SH 27 (SUTURE) ×2
SUT VIC AB 3-0 SH 27X BRD (SUTURE) ×3 IMPLANT
SUTURE MNCRL 4-0 27XMF (SUTURE) ×2 IMPLANT
SYR 10ML LL (SYRINGE) ×2 IMPLANT
SYR BULB IRRIG 60ML STRL (SYRINGE) ×1 IMPLANT
SYR CONTROL 10ML (SYRINGE) IMPLANT
TOWEL OR 17X26 4PK STRL BLUE (TOWEL DISPOSABLE) ×1 IMPLANT
WATER STERILE IRR 1000ML POUR (IV SOLUTION) ×3 IMPLANT

## 2018-11-17 NOTE — OR Nursing (Signed)
Discharge instructions discussed with pt and husband. Both voice understanding. 

## 2018-11-17 NOTE — Discharge Instructions (Signed)
INSTRUCTIONS FOR AFTER SURGERY ° ° °You are having surgery.  You will likely have some questions about what to expect following your operation.  The following information will help you and your family understand what to expect when you are discharged from the hospital.  Following these guidelines will help ensure a smooth recovery and reduce risks of complications.   °Postoperative instructions include information on: diet, wound care, medications and physical activity. ° °AFTER SURGERY °Expect to go home after the procedure.  In some cases, you may need to spend one night in the hospital for observation. ° °DIET °This surgery does not require a specific diet.  However, I have to mention that the healthier you eat the better your body can start healing. It is important to increasing your protein intake.  This means limiting the foods with sugar and carbohydrates.  Focus on vegetables and some meat.  If you have any liposuction during your procedure be sure to drink water.  If your urine is bright yellow, then it is concentrated, and you need to drink more water.  As a general rule after surgery, you should have 8 ounces of water every hour while awake.  If you find you are persistently nauseated or unable to take in liquids let us know.  NO TOBACCO USE or EXPOSURE.  This will slow your healing process and increase the risk of a wound. ° °WOUND CARE °You can shower the day after surgery if you don't have a drain.  Use fragrance free soap.  Dial, Dove and Ivory are usually mild on the skin. If you have a drain clean with baby wipes until the drain is removed.  If you have steri-strips / tape directly attached to your skin leave them in place. It is OK to get these wet.  No baths, pools or hot tubs for two weeks. °We close your incision to leave the smallest and best-looking scar. No ointment or creams on your incisions until given the go ahead.  Especially not Neosporin (Too many skin reactions with this one).  A few  weeks after surgery you can use Mederma and start massaging the scar. °We ask you to wear your binder or sports bra for the first 6 weeks around the clock, including while sleeping. This provides added comfort and helps reduce the fluid accumulation at the surgery site. ° °ACTIVITY °No heavy lifting until cleared by the doctor.  It is OK to walk and climb stairs. In fact, moving your legs is very important to decrease your risk of a blood clot.  It will also help keep you from getting deconditioned.  Every 1 to 2 hours get up and walk for 5 minutes. This will help with a quicker recovery back to normal.  Let pain be your guide so you don't do too much.  NO, you cannot do the spring cleaning and don't plan on taking care of anyone else.  This is your time for TLC.  °You will be more comfortable if you sleep and rest with your head elevated either with a few pillows under you or in a recliner.  No stomach sleeping for a few months. ° °WORK °Everyone returns to work at different times. As a rough guide, most people take at least 1 - 2 weeks off prior to returning to work. If you need documentation for your job, bring the forms to your postoperative follow up visit. ° °DRIVING °Arrange for someone to bring you home from the hospital.  You   may be able to drive a few days after surgery but not while taking any narcotics or valium. ° °BOWEL MOVEMENTS °Constipation can occur after anesthesia and while taking pain medication.  It is important to stay ahead for your comfort.  We recommend taking Milk of Magnesia (2 tablespoons; twice a day) while taking the pain pills. ° °SEROMA °This is fluid your body tried to put in the surgical site.  This is normal but if it creates tight skinny skin let us know.  It usually decreases in a few weeks. ° °WHEN TO CALL °Call your surgeon's office if any of the following occur: °• Fever 101 degrees F or greater °• Excessive bleeding or fluid from the incision site. °• Pain that increases  over time without aid from the medications °• Redness, warmth, or pus draining from incision sites °• Persistent nausea or inability to take in liquids °• Severe misshapen area that underwent the operation. ° ° ° °

## 2018-11-17 NOTE — Interval H&P Note (Signed)
History and Physical Interval Note:  11/17/2018 11:46 AM  Melinda Holder  has presented today for surgery, with the diagnosis of BREAST CANCER  The various methods of treatment have been discussed with the patient and family. After consideration of risks, benefits and other options for treatment, the patient has consented to  Procedure(s): BREAST LUMPECTOMY WITH NEEDLE LOCALIZATION (Left) SENTINEL NODE BIOPSY (Left) BREAST RECONSTRUCTION WITH PLACEMENT OF TISSUE EXPANDER AND FLEX HD (ACELLULAR HYDRATED DERMIS) (Left) as a surgical intervention .  The patient's history has been reviewed, patient examined, no change in status, stable for surgery.  I have reviewed the patient's chart and labs.  Questions were answered to the patient's satisfaction.     [name] [name]

## 2018-11-17 NOTE — Interval H&P Note (Signed)
History and Physical Interval Note:  11/17/2018 1:53 PM  Melinda Holder  has presented today for surgery, with the diagnosis of BREAST CANCER  The various methods of treatment have been discussed with the patient and family. After consideration of risks, benefits and other options for treatment, the patient has consented to  Procedure(s): BREAST LUMPECTOMY WITH NEEDLE LOCALIZATION (Left) SENTINEL NODE BIOPSY (Left) BREAST RECONSTRUCTION WITH PLACEMENT OF TISSUE EXPANDER AND FLEX HD (ACELLULAR HYDRATED DERMIS) (Left) as a surgical intervention .  The patient's history has been reviewed, patient examined, no change in status, stable for surgery.  I have reviewed the patient's chart and labs.  Questions were answered to the patient's satisfaction.     [place]

## 2018-11-17 NOTE — Anesthesia Procedure Notes (Signed)
Procedure Name: LMA Insertion Date/Time: 11/17/2018 3:02 PM Performed by: Johnna Acosta, CRNA Pre-anesthesia Checklist: Patient identified, Emergency Drugs available, Suction available, Patient being monitored and Timeout performed Patient Re-evaluated:Patient Re-evaluated prior to induction Oxygen Delivery Method: Circle system utilized Preoxygenation: Pre-oxygenation with 100% oxygen Induction Type: IV induction LMA: LMA inserted LMA Size: 4.0 Tube type: Oral Number of attempts: 1 Placement Confirmation: positive ETCO2 and breath sounds checked- equal and bilateral Tube secured with: Tape Dental Injury: Teeth and Oropharynx as per pre-operative assessment  Comments: Inserted by Dr. Marcello Moores

## 2018-11-17 NOTE — Anesthesia Preprocedure Evaluation (Signed)
Anesthesia Evaluation  Patient identified by MRN, date of birth, ID band Patient awake    Reviewed: Allergy & Precautions, NPO status , Patient's Chart, lab work & pertinent test results, reviewed documented beta blocker date and time   History of Anesthesia Complications (+) DIFFICULT AIRWAY  Airway Mallampati: IV  TM Distance: >3 FB     Dental  (+) Chipped   Pulmonary asthma , former smoker,           Cardiovascular      Neuro/Psych    GI/Hepatic GERD  Controlled,  Endo/Other    Renal/GU      Musculoskeletal   Abdominal   Peds  Hematology  (+) anemia ,   Anesthesia Other Findings Hx difficult intubation.  Reproductive/Obstetrics                             Anesthesia Physical Anesthesia Plan  ASA: III  Anesthesia Plan: General   Post-op Pain Management:    Induction: Intravenous  PONV Risk Score and Plan:   Airway Management Planned: Oral ETT  Additional Equipment:   Intra-op Plan:   Post-operative Plan:   Informed Consent: I have reviewed the patients History and Physical, chart, labs and discussed the procedure including the risks, benefits and alternatives for the proposed anesthesia with the patient or authorized representative who has indicated his/her understanding and acceptance.     Plan Discussed with: CRNA  Anesthesia Plan Comments:         Anesthesia Quick Evaluation

## 2018-11-17 NOTE — Transfer of Care (Signed)
Immediate Anesthesia Transfer of Care Note  Patient: Melinda Holder  Procedure(s) Performed: BREAST LUMPECTOMY WITH NEEDLE LOCALIZATION (Left ) SENTINEL NODE BIOPSY (Left )  Patient Location: PACU  Anesthesia Type:General  Level of Consciousness: awake, alert  and oriented  Airway & Oxygen Therapy: Patient Spontanous Breathing  Post-op Assessment: Report given to RN and Post -op Vital signs reviewed and stable  Post vital signs: Reviewed and stable  Last Vitals:  Vitals Value Taken Time  BP 122/88 11/17/2018  4:46 PM  Temp 36.2 C 11/17/2018  4:46 PM  Pulse 82 11/17/2018  4:47 PM  Resp 19 11/17/2018  4:47 PM  SpO2 96 % 11/17/2018  4:47 PM  Vitals shown include unvalidated device data.  Last Pain:  Vitals:   11/17/18 0942  TempSrc: Temporal  PainSc: 0-No pain         Complications: No apparent anesthesia complications

## 2018-11-17 NOTE — Op Note (Signed)
  Pre-operative Diagnosis: Left Breast Cancer  12 oclock     Post-operative Diagnosis: Same   Surgeon: Caroleen Hamman, MD FACS  Co-Surgeon: Dr. Marla Roe ( required for mastopexy)   Procedure: Left Partial mastectomy, needle directed, sentinel node biopsy Left mastopexy ( performed by Dr. Marla Roe)   Findings:  2 sentinel nodes both hot and blue Specimen xray w good margins, clip and wire within specimen  Estimated Blood Loss: Minimal         Drains: None         Specimens: partial mastectomy with labels, sentinel node   Complications: none                 Condition: Stable  Procedure Details  The patient was seen again in the Holding Room. The benefits, complications, treatment options, and expected outcomes were discussed with the patient. The risks of bleeding, infection, recurrence of symptoms, failure to resolve symptoms, hematoma, seroma, open wound, cosmetic deformity, and the need for further surgery were discussed.  The patient was taken to Operating Room, identified as Melinda Holder and the procedure verified.  A Time Out was held and the above information confirmed.  Prior to the induction of general anesthesia, antibiotic prophylaxis was administered. VTE prophylaxis was in place. Appropriate anesthesia was then administered and tolerated well. The chest was prepped with Chloraprep and draped in the sterile fashion. The patient was positioned in the supine position.   Isosulfan blue dye was injected periareolar early under aseptic conditions. Then using the hand-held probe an area of high counts was identified in the axilla, an incision was made and direction by the probe aided in dissection of a lymph node which had increase in counts. Exvivo counts confirmed node. There were two nodes within our specimen. THey were both blue and hot. THe axillary wound was closed in two layers with 3-0 vicryl and 4-0 monocryl.  Attention was turned to the needle localization site  where Dr. Marla Roe mark the appropriate mastopexy incision. We developed flaps excised around the needle to perform a partial mastectomy with adequate margins . This was done with electrocautery . Hemostasis was with electrocautery.  Once assuring that hemostasis was adequate and checked multiple times Dr. Marla Roe performed a mastopexy and closure of the wounds. Please see her separate op note for further details. Patient was taken to the recovery room in stable condition     Caroleen Hamman, MD, FACS

## 2018-11-17 NOTE — Anesthesia Post-op Follow-up Note (Signed)
Anesthesia QCDR form completed.        

## 2018-11-17 NOTE — Op Note (Signed)
Breast Reduction Op note:    DATE OF PROCEDURE: 11/17/2018  LOCATION: [facility]  SURGEON: Malachi Suderman Sanger Kenli Waldo, DO  PREOPERATIVE DIAGNOSIS 1. Left breast cancer 2. Post breast cancer, partial mastectomy, asymmetry  POSTOPERATIVE DIAGNOSIS 1. Left breast cancer 2. Post breast cancer, partial mastectomy, asymmetry  PROCEDURES 1. Left breast mastopexy.     COMPLICATIONS: None.  INDICATIONS FOR PROCEDURE Melinda Holder is a 47 y.o. year-old female born on May 28, 1971,with left breast cancer pain.  She is undergoing a left partial mastectomy for treatment of the breast cancer.  This will leave her with 1 - 2 bra cup size difference between right and left breasts.  She is planning on radiation which will make any surgery after the radiation extremely risky.  For this reason the decision was made to do the reduction at the same time for better symmetry. MRN: [id number]  CONSENT Informed consent was obtained directly from the patient. The risks, benefits and alternatives were fully discussed. Specific risks including but not limited to bleeding, infection, hematoma, seroma, scarring, pain, nipple necrosis, asymmetry, poor cosmetic results, and need for further surgery were discussed. The patient had ample opportunity to have her questions answered to her satisfaction.  DESCRIPTION OF PROCEDURE  Patient was brought into the operating room and placed in a supine position.  SCDs were placed and appropriate padding was performed.  Antibiotics were given. The patient underwent general anesthesia and the chest was prepped and draped in a sterile fashion.  A timeout was performed and all information was confirmed to be correct.  Left side: Preoperative markings were confirmed.  Incision lines were injected with 1% Xylocaine with epinephrine.  After waiting for vasoconstriction, the marked lines were incised.  General surgery will dictate their portion of the case that included  axillary lymph node excision and partial mastectomy.  A Wise-pattern medial breast mastopexy was performed by de-epithelializing the pedicle, using bovie to create the pedicle.  The partial mastectomy came from the superior portion of the breast.  Care was taken to not undermine the breast pedicle. Hemostasis was achieved.  The breast mound was gently rotated into position (~1.5 cm superiorly) with a gentle release of the inferior portion of the breast tissue.  The soft tissue was closed with 4-0 Monocryl.   The pocket was irrigated and hemostasis confirmed.  The deep tissues were approximated with 3-0 monocryl sutures and the skin was closed with deep dermal and subcuticular 5-0 Monocryl sutures.  Dermabond was applied.  A breast binder and ABDs were placed.  The nipple and skin flaps had good capillary refill at the end of the procedure.  The patient tolerated the procedure well. The patient was allowed to wake from anesthesia and taken to the recovery room in satisfactory condition

## 2018-11-18 ENCOUNTER — Encounter: Payer: Self-pay | Admitting: Surgery

## 2018-11-21 ENCOUNTER — Other Ambulatory Visit: Payer: Self-pay | Admitting: Anatomic Pathology & Clinical Pathology

## 2018-11-21 LAB — SURGICAL PATHOLOGY

## 2018-11-21 NOTE — Anesthesia Postprocedure Evaluation (Signed)
Anesthesia Post Note  Patient: Melinda Holder  Procedure(s) Performed: BREAST LUMPECTOMY WITH NEEDLE LOCALIZATION (Left ) SENTINEL NODE BIOPSY (Left )  Patient location during evaluation: PACU Anesthesia Type: General Level of consciousness: awake and alert Pain management: pain level controlled Vital Signs Assessment: post-procedure vital signs reviewed and stable Respiratory status: spontaneous breathing, nonlabored ventilation, respiratory function stable and patient connected to nasal cannula oxygen Cardiovascular status: blood pressure returned to baseline and stable Postop Assessment: no apparent nausea or vomiting Anesthetic complications: no     Last Vitals:  Vitals:   11/17/18 1711 11/17/18 1721  BP: 134/77 128/70  Pulse: 75 73  Resp: 18 16  Temp: 36.9 C   SpO2: 98% 98%    Last Pain:  Vitals:   11/18/18 0934  TempSrc:   PainSc: 0-No pain                 Molli Barrows

## 2018-11-23 ENCOUNTER — Telehealth: Payer: Self-pay | Admitting: *Deleted

## 2018-11-23 NOTE — Telephone Encounter (Signed)
Patient called and stated that you have gave her a call but was unable to answer. She stated that she will have her phone on her when you try calling her back

## 2018-11-28 ENCOUNTER — Telehealth: Payer: Self-pay | Admitting: *Deleted

## 2018-11-28 ENCOUNTER — Ambulatory Visit (INDEPENDENT_AMBULATORY_CARE_PROVIDER_SITE_OTHER): Payer: BLUE CROSS/BLUE SHIELD | Admitting: Surgery

## 2018-11-28 ENCOUNTER — Encounter: Payer: Self-pay | Admitting: Surgery

## 2018-11-28 VITALS — BP 137/86 | HR 90 | Temp 98.1°F | Resp 20 | Ht 62.0 in | Wt 155.2 lb

## 2018-11-28 DIAGNOSIS — Z09 Encounter for follow-up examination after completed treatment for conditions other than malignant neoplasm: Secondary | ICD-10-CM

## 2018-11-28 DIAGNOSIS — Z170 Estrogen receptor positive status [ER+]: Secondary | ICD-10-CM

## 2018-11-28 DIAGNOSIS — C50912 Malignant neoplasm of unspecified site of left female breast: Secondary | ICD-10-CM

## 2018-11-28 NOTE — Telephone Encounter (Signed)
Message left on cell phone for patient to call the office.   We can coordinate patient's surgery with Dr. Dahlia Byes and Dr. Marla Roe for 12-08-18. The patient will not need to do a Phone interview per [place] in [employer].   We do need to remind patient to be NPO after midnight, have a driver, and call the day before surgery to get her arrival time.

## 2018-11-28 NOTE — Patient Instructions (Signed)
Patient needs to be scheduled for surgery with Dr.Pabon.   Call the office with any questions or concerns.

## 2018-11-28 NOTE — H&P (View-Only) (Signed)
Outpatient Surgical Follow Up  11/28/2018  Melinda Holder is an 47 y.o. female.   Chief Complaint  Patient presents with  . Routine Post Op     f/u s/p left lumpectomy done 11-17-18    HPI: s/p left lumpectomy , pah d/w pt in detail as well as Dr. Heath Jones. Negative lymph node but there is positive margin on inferior segment, depite the specimen measuring 8x9 cm in size and clip and wire being well inside the specimen images. D/W the pt in detail about options of re-excision vs completion mastectomy and reconstruction. Patient is adamant that she does not want to take another chance of the margins being positive and also having second thoughts regarding the radiation therapy.  He wishes to have a completion mastectomy with immediate reconstruction.  I have also discussed the case in detail with Dr.Dillingham  and this will maybe provide a better cosmetic approach.  She understands that we will be able to do a nipple sparing given the previous mastopexy incision.  We will however be able to do a skin sparing mastectomy with immediate reconstruction.  Past Medical History:  Diagnosis Date  . Amblyopia of eye, right   . Anemia   . Asthma    childhood  . Cancer (HCC) 10/27/2018   left breast  . Difficult intubation 11/03/2016   DL x 2 with MAC 3 (CRNA and MD), very anterior. DL x 2 with Glidescope #3 Lo pro, bougie utilized. Easy mask.  . Endometriosis   . GERD (gastroesophageal reflux disease)     Past Surgical History:  Procedure Laterality Date  . ABLATION ON ENDOMETRIOSIS    . BREAST BIOPSY Left 10/28/2018   INVASIVE MAMMARY CARCINOMA  . BREAST EXCISIONAL BIOPSY Left 10/28/2018  . BREAST LUMPECTOMY Left 11/17/2018  . BREAST LUMPECTOMY WITH NEEDLE LOCALIZATION Left 11/17/2018   Procedure: BREAST LUMPECTOMY WITH NEEDLE LOCALIZATION;  Surgeon: ,  F, MD;  Location: [facility];  Service: General;  Laterality: Left;  . CLAVICLE SURGERY Left 10/2016  . COLONOSCOPY WITH  PROPOFOL N/A 06/27/2018   Procedure: COLONOSCOPY WITH PROPOFOL;  Surgeon: Wohl, Darren, MD;  Location: [facility];  Service: Endoscopy;  Laterality: N/A;  . ESOPHAGOGASTRODUODENOSCOPY (EGD) WITH PROPOFOL N/A 06/27/2018   Procedure: ESOPHAGOGASTRODUODENOSCOPY (EGD) WITH PROPOFOL;  Surgeon: Wohl, Darren, MD;  Location: [facility];  Service: Endoscopy;  Laterality: N/A;  . EYE MUSCLE SURGERY Right    child  . ORIF CLAVICULAR FRACTURE Left 11/03/2016   Procedure: OPEN REDUCTION INTERNAL FIXATION (ORIF) CLAVICULAR FRACTURE;  Surgeon: John J Poggi, MD;  Location: [facility];  Service: Orthopedics;  Laterality: Left;  . removal of fibroid    . SENTINEL NODE BIOPSY Left 11/17/2018   Procedure: SENTINEL NODE BIOPSY;  Surgeon: ,  F, MD;  Location: [facility];  Service: General;  Laterality: Left;    Family History  Problem Relation Age of Onset  . Lupus Mother   . Hypertension Mother   . Lymphoma Mother 70       B cell; currently 70  . Hypertension Father        currently 74  . Diabetes Father   . Breast cancer Paternal Aunt 43       deceased 68  . Lung cancer Paternal Aunt 70       deceased 74  . Brain cancer Paternal Grandfather 68       deceased 68    Social History:  reports that she quit smoking about 3 years ago.   Her smoking use included cigarettes. She has a 15.00 pack-year smoking history. She has never used smokeless tobacco. She reports that she drinks about 2.0 standard drinks of alcohol per week. She reports that she does not use drugs.  Allergies: No Known Allergies  Medications reviewed.    ROS Full ROS performed and is otherwise negative other than what is stated in HPI   BP 137/86   Pulse 90   Temp 98.1 F (36.7 C) (Temporal)   Resp 20   Ht _0  (1.575 m)   Wt 155 lb 3.2 oz (70.4 kg)   LMP 11/17/2018   SpO2 97%   BMI 28.39 kg/m   Physical Exam  Constitutional: She is oriented to person, place, and time. She appears well-developed and  well-nourished.  Cardiovascular: Normal rate and regular rhythm.  Pulmonary/Chest: Effort normal. No stridor. No respiratory distress. She has no wheezes.  Breast: incision healing well, no infection, no lymphedema  Abdominal: Soft.  Neurological: She is alert and oriented to person, place, and time. No cranial nerve deficit. She exhibits normal muscle tone. Coordination normal.  Skin: Skin is warm and dry. Capillary refill takes less than 2 seconds.  Psychiatric: She has a normal mood and affect. Her behavior is normal. Judgment and thought content normal.  Nursing note and vitals reviewed.    Assessment/Plan:  1. Malignant neoplasm of left breast in female, estrogen receptor positive, unspecified site of breast [facility]) Plan for completion mastectomy followed by immediate reconstruction.  Procedure discussed with the patient in detail.  Risk benefit and possible complications including but not limited to: Bleeding, infection, re-interventions.  Also discussed in detail that given the previous incision we will only be able to do his care in a sparing but not a nipple sparing mastectomy.  She is okay with that she is more concerned about leaving any potential cancer behind at this time. We will coordinate with Dr. Marla Roe for operative date  Caroleen Hamman, MD [facility] General Surgeon

## 2018-11-28 NOTE — Progress Notes (Signed)
Outpatient Surgical Follow Up  11/28/2018  Kirsta Probert Holder is an 47 y.o. female.   Chief Complaint  Patient presents with  . Routine Post Op     f/u s/p left lumpectomy done 11-17-18    HPI: s/p left lumpectomy , pah d/w pt in detail as well as Dr. Allena Napoleon. Negative lymph node but there is positive margin on inferior segment, depite the specimen measuring 8x9 cm in size and clip and wire being well inside the specimen images. D/W the pt in detail about options of re-excision vs completion mastectomy and reconstruction. Patient is adamant that she does not want to take another chance of the margins being positive and also having second thoughts regarding the radiation therapy.  He wishes to have a completion mastectomy with immediate reconstruction.  I have also discussed the case in detail with Dr.Dillingham  and this will maybe provide a better cosmetic approach.  She understands that we will be able to do a nipple sparing given the previous mastopexy incision.  We will however be able to do a skin sparing mastectomy with immediate reconstruction.  Past Medical History:  Diagnosis Date  . Amblyopia of eye, right   . Anemia   . Asthma    childhood  . Cancer ([place]) 10/27/2018   left breast  . Difficult intubation 11/03/2016   DL x 2 with MAC 3 (CRNA and MD), very anterior. DL x 2 with Glidescope #3 Lo pro, bougie utilized. Easy mask.  . Endometriosis   . GERD (gastroesophageal reflux disease)     Past Surgical History:  Procedure Laterality Date  . ABLATION ON ENDOMETRIOSIS    . BREAST BIOPSY Left 10/28/2018   INVASIVE MAMMARY CARCINOMA  . BREAST EXCISIONAL BIOPSY Left 10/28/2018  . BREAST LUMPECTOMY Left 11/17/2018  . BREAST LUMPECTOMY WITH NEEDLE LOCALIZATION Left 11/17/2018   Procedure: BREAST LUMPECTOMY WITH NEEDLE LOCALIZATION;  Surgeon: Jules Husbands, MD;  Location: [facility];  Service: General;  Laterality: Left;  . CLAVICLE SURGERY Left 10/2016  . COLONOSCOPY WITH  PROPOFOL N/A 06/27/2018   Procedure: COLONOSCOPY WITH PROPOFOL;  Surgeon: Lucilla Lame, MD;  Location: [place];  Service: Endoscopy;  Laterality: N/A;  . ESOPHAGOGASTRODUODENOSCOPY (EGD) WITH PROPOFOL N/A 06/27/2018   Procedure: ESOPHAGOGASTRODUODENOSCOPY (EGD) WITH PROPOFOL;  Surgeon: Lucilla Lame, MD;  Location: [place];  Service: Endoscopy;  Laterality: N/A;  . EYE MUSCLE SURGERY Right    child  . ORIF CLAVICULAR FRACTURE Left 11/03/2016   Procedure: OPEN REDUCTION INTERNAL FIXATION (ORIF) CLAVICULAR FRACTURE;  Surgeon: Corky Mull, MD;  Location: [facility];  Service: Orthopedics;  Laterality: Left;  . removal of fibroid    . SENTINEL NODE BIOPSY Left 11/17/2018   Procedure: SENTINEL NODE BIOPSY;  Surgeon: Jules Husbands, MD;  Location: [facility];  Service: General;  Laterality: Left;    Family History  Problem Relation Age of Onset  . Lupus Mother   . Hypertension Mother   . Lymphoma Mother 74       B cell; currently 12  . Hypertension Father        currently 56  . Diabetes Father   . Breast cancer Paternal Aunt 24       deceased 74  . Lung cancer Paternal Aunt 50       deceased 24  . Brain cancer Paternal Grandfather 81       deceased 82    Social History:  reports that she quit smoking about 3 years ago.  Her smoking use included cigarettes. She has a 15.00 pack-year smoking history. She has never used smokeless tobacco. She reports that she drinks about 2.0 standard drinks of alcohol per week. She reports that she does not use drugs.  Allergies: No Known Allergies  Medications reviewed.    ROS Full ROS performed and is otherwise negative other than what is stated in HPI   BP 137/86   Pulse 90   Temp 98.1 F (36.7 C) (Temporal)   Resp 20   Ht _0  (1.575 m)   Wt 155 lb 3.2 oz (70.4 kg)   LMP 11/17/2018   SpO2 97%   BMI 28.39 kg/m   Physical Exam  Constitutional: She is oriented to person, place, and time. She appears well-developed and  well-nourished.  Cardiovascular: Normal rate and regular rhythm.  Pulmonary/Chest: Effort normal. No stridor. No respiratory distress. She has no wheezes.  Breast: incision healing well, no infection, no lymphedema  Abdominal: Soft.  Neurological: She is alert and oriented to person, place, and time. No cranial nerve deficit. She exhibits normal muscle tone. Coordination normal.  Skin: Skin is warm and dry. Capillary refill takes less than 2 seconds.  Psychiatric: She has a normal mood and affect. Her behavior is normal. Judgment and thought content normal.  Nursing note and vitals reviewed.    Assessment/Plan:  1. Malignant neoplasm of left breast in female, estrogen receptor positive, unspecified site of breast [facility]) Plan for completion mastectomy followed by immediate reconstruction.  Procedure discussed with the patient in detail.  Risk benefit and possible complications including but not limited to: Bleeding, infection, re-interventions.  Also discussed in detail that given the previous incision we will only be able to do his care in a sparing but not a nipple sparing mastectomy.  She is okay with that she is more concerned about leaving any potential cancer behind at this time. We will coordinate with Dr. Marla Roe for operative date  Caroleen Hamman, MD [facility] General Surgeon

## 2018-11-29 ENCOUNTER — Ambulatory Visit (INDEPENDENT_AMBULATORY_CARE_PROVIDER_SITE_OTHER): Payer: BLUE CROSS/BLUE SHIELD | Admitting: Plastic Surgery

## 2018-11-29 ENCOUNTER — Encounter: Payer: Self-pay | Admitting: Plastic Surgery

## 2018-11-29 VITALS — BP 135/80 | HR 83 | Resp 16 | Ht 62.0 in | Wt 155.0 lb

## 2018-11-29 DIAGNOSIS — C50912 Malignant neoplasm of unspecified site of left female breast: Secondary | ICD-10-CM

## 2018-11-29 NOTE — Telephone Encounter (Signed)
Patient notified of surgery date and instructions. Aware NPO after midnight and to call the day before for her arrival time.

## 2018-11-29 NOTE — Progress Notes (Signed)
Pt reports that she currently has a Left molar abcess & is on Amoxicillin 3/day & has a root canal planned for this Friday 12/02/18

## 2018-11-29 NOTE — Progress Notes (Signed)
   Subjective:    Patient ID: Melinda Holder, female    DOB: January 30, 1971, 47 y.o.   MRN: [id number]  The patient is a 47 year old female who is here for follow-up after a left breast reconstruction.  She underwent a partial left mastectomy with reconstruction at the same time with a mastopexy.  She was found to have a positive margin.  She has already spoken with Dr. Dahlia Byes.  The plan is for a completion mastectomy.  Her incision is healing well she is very pleased with the way that the breast looks.  She is handling disappointment of having to have the mastectomy quite well.  She does have a need for to root canals with intense pain and currently on antibiotics.   Review of Systems  Constitutional: Negative.   HENT: Positive for facial swelling.   Respiratory: Negative.   Gastrointestinal: Negative.   Endocrine: Negative.   Genitourinary: Negative.   Musculoskeletal: Negative.   Skin: Negative.       Objective:   Physical Exam  Constitutional: She appears well-developed and well-nourished.  HENT:  Head: Normocephalic.  Eyes: Pupils are equal, round, and reactive to light.  Cardiovascular: Normal rate.  Pulmonary/Chest: Effort normal.  Neurological: She is alert.  Psychiatric: She has a normal mood and affect. Her behavior is normal. Thought content normal.       Assessment & Plan:  Malignant neoplasm of left female breast, unspecified estrogen receptor status, unspecified site of breast ([place])  I placed a call to Dr. Dahlia Byes to discuss the timing of surgery.  I am concerned about putting an expander in the left breast with the current or recent dental infection.  I am waiting for callback.  The patient is aware.  Ultimately the plan is for immediate breast reconstruction with expander and flex HD.

## 2018-11-29 NOTE — H&P (View-Only) (Signed)
   Subjective:    Patient ID: Melinda Holder, female    DOB: 04-10-1971, 47 y.o.   MRN: [id number]  The patient is a 47 year old female who is here for follow-up after a left breast reconstruction.  She underwent a partial left mastectomy with reconstruction at the same time with a mastopexy.  She was found to have a positive margin.  She has already spoken with Dr. Dahlia Byes.  The plan is for a completion mastectomy.  Her incision is healing well she is very pleased with the way that the breast looks.  She is handling disappointment of having to have the mastectomy quite well.  She does have a need for to root canals with intense pain and currently on antibiotics.   Review of Systems  Constitutional: Negative.   HENT: Positive for facial swelling.   Respiratory: Negative.   Gastrointestinal: Negative.   Endocrine: Negative.   Genitourinary: Negative.   Musculoskeletal: Negative.   Skin: Negative.       Objective:   Physical Exam  Constitutional: She appears well-developed and well-nourished.  HENT:  Head: Normocephalic.  Eyes: Pupils are equal, round, and reactive to light.  Cardiovascular: Normal rate.  Pulmonary/Chest: Effort normal.  Neurological: She is alert.  Psychiatric: She has a normal mood and affect. Her behavior is normal. Thought content normal.       Assessment & Plan:  Malignant neoplasm of left female breast, unspecified estrogen receptor status, unspecified site of breast ([place])  I placed a call to Dr. Dahlia Byes to discuss the timing of surgery.  I am concerned about putting an expander in the left breast with the current or recent dental infection.  I am waiting for callback.  The patient is aware.  Ultimately the plan is for immediate breast reconstruction with expander and flex HD.

## 2018-11-30 ENCOUNTER — Telehealth: Payer: Self-pay

## 2018-11-30 ENCOUNTER — Telehealth: Payer: Self-pay | Admitting: *Deleted

## 2018-11-30 NOTE — Telephone Encounter (Signed)
I was reading in chart it does not look like the patient has had her breast surgery yet.  There is a noted today that the surgery scheduled for 1212 has been canceled until she has some dental work done based on this information we do not need to see her tomorrow.  The Melinda Holder wanted to see her after she has had surgery.  I have called her phone and left a message.  Asked her to give me a call back to make sure the information on reading in the chart is accurate and we need to cancel her appointment and reschedule it for after she has surgery.  I have left my name and number to directly call me and leave me a message

## 2018-11-30 NOTE — Telephone Encounter (Signed)
Telephone call to patient to inform her that Dr. Marla Roe has spoken to Dr. Dahlia Byes regarding her current dental abscess(x2) and they have determined that her surgery on 12/08/18 will be cancelled and rescheduled when she is cleared by her Dentist. Patient agrees with plan of care & she will forward the dental clearance when available.

## 2018-12-01 ENCOUNTER — Encounter: Payer: Self-pay | Admitting: Oncology

## 2018-12-01 ENCOUNTER — Telehealth: Payer: Self-pay | Admitting: *Deleted

## 2018-12-01 ENCOUNTER — Inpatient Hospital Stay: Payer: BLUE CROSS/BLUE SHIELD | Attending: Oncology | Admitting: Oncology

## 2018-12-01 ENCOUNTER — Other Ambulatory Visit: Payer: Self-pay

## 2018-12-01 VITALS — BP 127/77 | HR 71 | Temp 99.0°F | Resp 18 | Wt 152.2 lb

## 2018-12-01 DIAGNOSIS — K047 Periapical abscess without sinus: Secondary | ICD-10-CM | POA: Insufficient documentation

## 2018-12-01 DIAGNOSIS — Z170 Estrogen receptor positive status [ER+]: Secondary | ICD-10-CM | POA: Insufficient documentation

## 2018-12-01 DIAGNOSIS — C50812 Malignant neoplasm of overlapping sites of left female breast: Secondary | ICD-10-CM | POA: Insufficient documentation

## 2018-12-01 DIAGNOSIS — D509 Iron deficiency anemia, unspecified: Secondary | ICD-10-CM | POA: Diagnosis not present

## 2018-12-01 DIAGNOSIS — C50912 Malignant neoplasm of unspecified site of left female breast: Secondary | ICD-10-CM

## 2018-12-01 DIAGNOSIS — Z7189 Other specified counseling: Secondary | ICD-10-CM

## 2018-12-01 NOTE — Progress Notes (Signed)
Hematology/Oncology Consult note [facility]  Telephone:([phone] [phone]  Patient Care Team: Leone Haven, MD as PCP - General (Family Medicine)   Name of the patient: Melinda Holder  [id number]  1971/08/01   Date of visit: 12/01/18  Diagnosis-stage IA invasive mammary carcinoma of the left breast grade 1 PT 1 APN 0CM0 ER PR positive HER-2/neu negative status post lumpectomy  Chief complaint/ Reason for visit-discuss pathology results and further management  Heme/Onc history: Patient is a 47 year old premenopausal woman who sees me for her iron deficiency anemia.  She has received Feraheme in the past last in March 2019 and her hemoglobin had improved.  Patient also has thrombocytosis dating back to 2017.  Jak 2 mutation testing has been negative.  Bone marrow biopsy not done at this time as she is less than 79 years of age with no prior history of thrombosis.  She is now seeing me for her new diagnosis of breast cancer.  Patient has not had any prior abnormal mammograms or breast biopsies palpated a breast mass at the 12 o'clock position of her left breast which was followed by screening and diagnostic mammogram.  It showed 1.1 cm left breast mass at the 12 o'clock position 3 cm from the nipple.  Multiple benign cysts in the upper quadrant of the left breast.  No axillary lymphadenopathy.  This was biopsied and biopsy showed 7 mm grade 2 invasive mammary carcinoma strongly ER PR positive and HER-2/neu negative.  Patient will be seeing Dr. Dahlia Byes tomorrow.  Final pathology showed invasive mammary carcinoma, 18 mm, grade 1.  0 out of 7 sentinel lymph nodes were positive for malignancy.  pT1cpN0.  Inferior margin was positive and patient is scheduled for a reexcision in the near future  Interval history-patient is aware of her positive margin at the time of surgery and was supposed to get reexcision surgery.  However she developed a dental infection  and is currently on antibiotics.  She will be getting her tooth extraction tomorrow and next week followed by which she will be going for a reexcision surgery.  ECOG PS- 0 Pain scale- 0   Review of systems- Review of Systems  Constitutional: Negative for chills, fever, malaise/fatigue and weight loss.  HENT: Negative for congestion, ear discharge and nosebleeds.   Eyes: Negative for blurred vision.  Respiratory: Negative for cough, hemoptysis, sputum production, shortness of breath and wheezing.   Cardiovascular: Negative for chest pain, palpitations, orthopnea and claudication.  Gastrointestinal: Negative for abdominal pain, blood in stool, constipation, diarrhea, heartburn, melena, nausea and vomiting.  Genitourinary: Negative for dysuria, flank pain, frequency, hematuria and urgency.  Musculoskeletal: Negative for back pain, joint pain and myalgias.  Skin: Negative for rash.  Neurological: Negative for dizziness, tingling, focal weakness, seizures, weakness and headaches.  Endo/Heme/Allergies: Does not bruise/bleed easily.  Psychiatric/Behavioral: Negative for depression and suicidal ideas. The patient does not have insomnia.        No Known Allergies   Past Medical History:  Diagnosis Date  . Amblyopia of eye, right   . Anemia   . Asthma    childhood  . Cancer ([place]) 10/27/2018   left breast  . Difficult intubation 11/03/2016   DL x 2 with MAC 3 (CRNA and MD), very anterior. DL x 2 with Glidescope #3 Lo pro, bougie utilized. Easy mask.  . Endometriosis   . GERD (gastroesophageal reflux disease)      Past Surgical History:  Procedure Laterality Date  .  ABLATION ON ENDOMETRIOSIS    . BREAST BIOPSY Left 10/28/2018   INVASIVE MAMMARY CARCINOMA  . BREAST EXCISIONAL BIOPSY Left 10/28/2018  . BREAST LUMPECTOMY Left 11/17/2018  . BREAST LUMPECTOMY WITH NEEDLE LOCALIZATION Left 11/17/2018   Procedure: BREAST LUMPECTOMY WITH NEEDLE LOCALIZATION;  Surgeon: Jules Husbands, MD;   Location: [facility];  Service: General;  Laterality: Left;  . CLAVICLE SURGERY Left 10/2016  . COLONOSCOPY WITH PROPOFOL N/A 06/27/2018   Procedure: COLONOSCOPY WITH PROPOFOL;  Surgeon: Lucilla Lame, MD;  Location: [place];  Service: Endoscopy;  Laterality: N/A;  . ESOPHAGOGASTRODUODENOSCOPY (EGD) WITH PROPOFOL N/A 06/27/2018   Procedure: ESOPHAGOGASTRODUODENOSCOPY (EGD) WITH PROPOFOL;  Surgeon: Lucilla Lame, MD;  Location: [place];  Service: Endoscopy;  Laterality: N/A;  . EYE MUSCLE SURGERY Right    child  . ORIF CLAVICULAR FRACTURE Left 11/03/2016   Procedure: OPEN REDUCTION INTERNAL FIXATION (ORIF) CLAVICULAR FRACTURE;  Surgeon: Corky Mull, MD;  Location: [facility];  Service: Orthopedics;  Laterality: Left;  . removal of fibroid    . SENTINEL NODE BIOPSY Left 11/17/2018   Procedure: SENTINEL NODE BIOPSY;  Surgeon: Jules Husbands, MD;  Location: [facility];  Service: General;  Laterality: Left;    Social History   Socioeconomic History  . Marital status: Married    Spouse name: 0  . Number of children: 0  . Years of education: Not on file  . Highest education level: Associate degree: academic program  Occupational History  . Occupation: Librarian, academic    Comment: Actuary  Social Needs  . Financial resource strain: Not on file  . Food insecurity:    Worry: Not on file    Inability: Not on file  . Transportation needs:    Medical: Not on file    Non-medical: Not on file  Tobacco Use  . Smoking status: Former Smoker    Packs/day: 1.00    Years: 15.00    Pack years: 15.00    Types: Cigarettes    Last attempt to quit: 02/21/2015    Years since quitting: 3.7  . Smokeless tobacco: Never Used  Substance and Sexual Activity  . Alcohol use: Yes    Alcohol/week: 2.0 standard drinks    Types: 2 Cans of beer per week    Comment: Occasional   . Drug use: No  . Sexual activity: Yes    Partners: Male    Comment: 1 female  Lifestyle  . Physical activity:      Days per week: Not on file    Minutes per session: Not on file  . Stress: Not on file  Relationships  . Social connections:    Talks on phone: Not on file    Gets together: Not on file    Attends religious service: Not on file    Active member of club or organization: Not on file    Attends meetings of clubs or organizations: Not on file    Relationship status: Not on file  . Intimate partner violence:    Fear of current or ex partner: Not on file    Emotionally abused: Not on file    Physically abused: Not on file    Forced sexual activity: Not on file  Other Topics Concern  . 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 Lupus Mother   . Hypertension Mother   . Lymphoma Mother 67       B cell; currently 67  . Hypertension Father        currently 63  . Diabetes Father   . Breast cancer Paternal Aunt 65       deceased 67  . Lung cancer Paternal Aunt 57       deceased 72  . Brain cancer Paternal Grandfather 84       deceased 41     Current Outpatient Medications:  .  amoxicillin (AMOXIL) 500 MG capsule, Take 500 mg by mouth 3 (three) times daily. , Disp: , Rfl:  .  hydrOXYzine (ATARAX/VISTARIL) 10 MG tablet, TAKE 1 TABLET BY MOUTH EVERYDAY AT BEDTIME, Disp: 30 tablet, Rfl: 5 .  acetaminophen (TYLENOL) 500 MG tablet, Take 1,000 mg by mouth 2 (two) times daily as needed for moderate pain or headache., Disp: , Rfl:  .  HYDROcodone-acetaminophen (NORCO/VICODIN) 5-325 MG tablet, Take 1 tablet by mouth every 6 (six) hours as needed for moderate pain. (Patient not taking: Reported on 12/01/2018), Disp: 20 tablet, Rfl: 0 .  ibuprofen (ADVIL,MOTRIN) 200 MG tablet, Take 400 mg by mouth 2 (two) times daily as needed for headache or moderate pain. , Disp: , Rfl:  .  omeprazole (PRILOSEC)  20 MG capsule, Take 1 capsule (20 mg total) by mouth 2 (two) times daily before a meal. (Patient not taking: Reported on 11/28/2018), Disp: 60 capsule, Rfl: 1 .  polyethylene glycol (MIRALAX / GLYCOLAX) packet, Take 17 g by mouth daily as needed for moderate constipation., Disp: , Rfl:   Physical exam:  Vitals:   12/01/18 1030  BP: 127/77  Pulse: 71  Resp: 18  Temp: 99 F (37.2 C)  TempSrc: Tympanic  Weight: 152 lb 3.2 oz (69 kg)   Physical Exam  Constitutional: She is oriented to person, place, and time. She appears well-developed and well-nourished.  HENT:  Head: Normocephalic and atraumatic.  Eyes: Pupils are equal, round, and reactive to light. EOM are normal.  Neck: Normal range of motion.  Cardiovascular: Normal rate, regular rhythm and normal heart sounds.  Pulmonary/Chest: Effort normal and breath sounds normal.  Abdominal: Soft. Bowel sounds are normal.  Neurological: She is alert and oriented to person, place, and time.  Skin: Skin is warm and dry.   Breast exam was performed in seated and lying down position. Patient is status post left lumpectomy with a well-healed surgical scar. No evidence of any palpable masses. No evidence of axillary adenopathy. No evidence of any palpable masses or lumps in the right breast. No evidence of right axillary adenopathy   CMP Latest Ref Rng & Units 06/07/2018  Glucose 70 - 99 mg/dL 95  BUN 6 - 23 mg/dL 13  Creatinine 0.40 - 1.20 mg/dL 0.73  Sodium 135 - 145 mEq/L 139  Potassium 3.5 - 5.1 mEq/L 4.8  Chloride 96 - 112 mEq/L 104  CO2 19 - 32 mEq/L 30  Calcium 8.4 - 10.5 mg/dL 9.6  Total Protein 6.5 - 8.1 g/dL -  Total Bilirubin 0.3 - 1.2 mg/dL -  Alkaline Phos 38 - 126 U/L -  AST 15 - 41 U/L -  ALT 14 - 54 U/L -   CBC Latest Ref Rng & Units 11/09/2018  WBC 4.0 - 10.5 K/uL 6.4  Hemoglobin 12.0 - 15.0 g/dL 13.6  Hematocrit  36.0 - 46.0 % 41.0  Platelets 150 - 400 K/uL 582(H)    No images are attached to the encounter.  Nm  Sentinel Node Injection  Result Date: 11/17/2018 CLINICAL DATA:  Left breast cancer. EXAM: NUCLEAR MEDICINE BREAST LYMPHOSCINTIGRAPHY TECHNIQUE: Intradermal injection of radiopharmaceutical was performed at the 12 o'clock, 3 o'clock, 6 o'clock, and 9 o'clock positions around the left nipple. The patient was then sent to the operating room where the sentinel node(s) were identified and removed by the surgeon. RADIOPHARMACEUTICALS:  Total of 1 mCi Millipore-filtered Technetium-23msulfur colloid, injected in four aliquots of 0.25 mCi each. IMPRESSION: Uncomplicated intradermal injection of a total of 1 mCi Technetium-931mulfur colloid for purposes of sentinel node identification. Electronically Signed   By: JoSandi Mariscal.D.   On: 11/17/2018 10:02   Mm Breast Surgical Specimen  Result Date: 11/17/2018 CLINICAL DATA:  Needle localization was performed the left breast prior to lumpectomy. EXAM: SPECIMEN RADIOGRAPH OF THE LEFT BREAST COMPARISON:  Previous exam(s). FINDINGS: Status post excision of the left breast. The wire tip and biopsy marker clip are present and are marked for pathology. IMPRESSION: Specimen radiograph of the left breast. Electronically Signed   By: SuCurlene Dolphin.D.   On: 11/17/2018 16:25   Mm Left Plc Breast Loc Dev   1st Lesion  Inc Mammo Guide  Result Date: 11/17/2018 CLINICAL DATA:  Needle localization was requested the left breast prior to lumpectomy for recent diagnosis of breast cancer. EXAM: NEEDLE LOCALIZATION OF THE LEFT BREAST WITH MAMMO GUIDANCE COMPARISON:  Previous exams. FINDINGS: Patient presents for needle localization prior to lumpectomy. I met with the patient and we discussed the procedure of needle localization including benefits and alternatives. We discussed the high likelihood of a successful procedure. We discussed the risks of the procedure, including infection, bleeding, tissue injury, and further surgery. Informed, written consent was given. The usual  time-out protocol was performed immediately prior to the procedure. Using mammographic guidance, sterile technique, 1% lidocaine and a 5 cm modified Kopans needle, the coil shaped biopsy clip localized using a superior approach. The images were marked for Dr. PaDahlia ByesIMPRESSION: Needle localization left breast. No apparent complications. Electronically Signed   By: SuCurlene Dolphin.D.   On: 11/17/2018 10:00     Assessment and plan- Patient is a [age].o. female with stage Ia invasive mammary carcinoma of the left breast pT1 cpN0 cM0 ER PR positive HER-2/neu negative status post lumpectomy with positive inferior margin  Patient will be undergoing reexcision surgery hopefully in the next 2 weeks.  I discussed the results of the lumpectomy pathology with the patient in detail.  This showed 18 mm grade 1 invasive mammary carcinoma that was ER PR positive HER-2/neu negative with negative nodes.  I would therefore recommend Oncotype testing for her to see if she would benefit from adjuvant chemotherapy or not.  If she falls in the low risk zone with a score of less than 11 she would not benefit from chemotherapy.  Chemotherapy would be indicated if her score is 26 or higher.  She may may not need chemotherapy if her score is in between 21-25.  Her scores below 20 I would not offer her chemotherapy.  I will tentatively see her back in 3 weeks time to discuss the results of her Oncotype testing and further management.  She will also see radiation oncology the same day.  If she does not require adjuvant chemotherapy she can proceed with adjuvant radiation treatment at that  time.  I will talk to her about hormone therapy in greater detail during her next visit.  Treatment will be given with a curative intent.  I will touch base with Dr. Dahlia Byes regarding her reexcision surgery.  Patient had some questions if she has to undergo mastectomy and I told her that I do not think that a mastectomy would be indicated for her  positive margins  Cancer Staging Malignant neoplasm of left female breast [facility]) Staging form: Breast, AJCC 8th Edition - Clinical stage from 11/01/2018: Stage IA (cT1c, cN0, cM0, G2, ER+, PR+, HER2-) - Signed by Sindy Guadeloupe, MD on 11/03/2018 - Pathologic stage from 12/01/2018: Stage IA (pT1c, pN0, cM0, G1, ER+, PR+, HER2-) - Signed by Sindy Guadeloupe, MD on 12/01/2018     Visit Diagnosis 1. Goals of care, counseling/discussion   2. Malignant neoplasm of left female breast, unspecified estrogen receptor status, unspecified site of breast ([place])      Dr. Randa Evens, MD, MPH [facility] at [facility] [phone] 12/01/2018 11:58 AM

## 2018-12-01 NOTE — Telephone Encounter (Signed)
Message for patient to call the office.   I just wanted to touch base with the patient regarding below.   Per [place] at Dr. Eusebio Friendly office, patient has two abscessed teeth removed. She will need to get that cleared up prior to surgery.   Per [place], patient is aware and will let their office know when she can reschedule.   [facility] Notified of cancellation.   Note to Dr. Dahlia Byes.

## 2018-12-01 NOTE — Progress Notes (Signed)
Here for follow up. " im doing alright " left upper arm tenderness she stated  ( not pain)  Full ROM of arm.  On abx for dental issue-having root canal in am.  Also having 2 nd root canal on 12/08/18

## 2018-12-02 ENCOUNTER — Telehealth: Payer: Self-pay | Admitting: *Deleted

## 2018-12-02 NOTE — Telephone Encounter (Signed)
Attempted to call the pt multiple times regarding questions about surgical options. Left Her a message.

## 2018-12-02 NOTE — Telephone Encounter (Signed)
Patient is returning your call.  

## 2018-12-05 NOTE — Telephone Encounter (Signed)
D/W the pt in detail about re-excision vs completion mastectomy. D/W Dr. Marla Roe. She initially wanted completion mastectomy w reconstruction and then changed her mind after she had a discussion with Dr. Janese Banks. I D/W Dr. Marla Roe the case in detail. Our concern for re-excision is the viability of the nipple areola complex . After explaining this to the patient once again ( chances of nipple necrosis and deformity). She wishes to think it over again and give [place] an answer in the next 3-5 days.  Extensive counseling provided

## 2018-12-07 ENCOUNTER — Ambulatory Visit: Payer: BLUE CROSS/BLUE SHIELD | Admitting: Family Medicine

## 2018-12-08 ENCOUNTER — Ambulatory Visit: Admit: 2018-12-08 | Payer: BLUE CROSS/BLUE SHIELD | Admitting: Surgery

## 2018-12-08 SURGERY — SIMPLE MASTECTOMY
Anesthesia: Choice | Laterality: Left

## 2018-12-09 ENCOUNTER — Telehealth: Payer: Self-pay

## 2018-12-09 NOTE — Telephone Encounter (Signed)
Received letter from patient's dentist stating what was done and that all dental work is complete. She would like to speak with Dr Dahlia Byes one more time before making a decision on what type of surgery she will have. I have sent a message to him about contacting the patient.

## 2018-12-09 NOTE — Telephone Encounter (Signed)
I had a lengthy d/w the pt about her concerns. She is concerned about having deformity of the breast if we do a re-excision and her nipple becomes ischemic. I do think that either option is acceptable from an oncologic perspective. I encouraged her to discuss the cosmetic outcomes with Dr. Marla Roe, If I do a re-excision I would definitely do it with her given the previous mastopexy.

## 2018-12-12 ENCOUNTER — Encounter: Payer: Self-pay | Admitting: Oncology

## 2018-12-13 ENCOUNTER — Encounter: Payer: Self-pay | Admitting: Oncology

## 2018-12-13 ENCOUNTER — Telehealth: Payer: Self-pay | Admitting: *Deleted

## 2018-12-13 NOTE — Telephone Encounter (Signed)
Patient called and is frustrated because she can not get in touch with anyone at [place] office and no one will call her back, patient wanted to talk with the surgery scheduler regarding this.

## 2018-12-13 NOTE — Telephone Encounter (Signed)
I spoke with the patient and let her know that I had spoken with [place] at Dr Dillingham's office and she said that Dr Marla Roe would be in contact with her today to discuss what she wants to have done for surgery.

## 2018-12-15 ENCOUNTER — Telehealth: Payer: Self-pay | Admitting: *Deleted

## 2018-12-15 ENCOUNTER — Telehealth: Payer: Self-pay | Admitting: Plastic Surgery

## 2018-12-15 NOTE — Telephone Encounter (Signed)
Patient was contacted today and she stated that she did speak with Dr. Marla Roe yesterday evening.   The patient wishes to proceed with a lumpectomy.   Note to Dr. Dahlia Byes to correct orders.

## 2018-12-15 NOTE — Telephone Encounter (Signed)
Called patient to schedule pre-op for procedure scheduled for 12/19/2018. Patient stated that she had recently been seen by Dr. Marla Roe and inquired if pre-op was necessary. Informed patient I would call her back with more information on same.

## 2018-12-16 ENCOUNTER — Other Ambulatory Visit: Payer: Self-pay

## 2018-12-16 ENCOUNTER — Encounter
Admission: RE | Admit: 2018-12-16 | Discharge: 2018-12-16 | Disposition: A | Discharge status: Home / Self Care | Payer: BLUE CROSS/BLUE SHIELD | Source: Ambulatory Visit | Attending: Surgery | Admitting: Surgery

## 2018-12-16 NOTE — Telephone Encounter (Signed)
I will change the orders, thanks

## 2018-12-16 NOTE — Patient Instructions (Signed)
Your procedure is scheduled on: 12-19-18  Report to Same Day Surgery 2nd floor medical mall [facility] Entrance-take elevator on left to 2nd floor.  Check in with surgery information desk.) To find out your arrival time please call [phone] between 1PM - 3PM on 12-16-18  Remember: Instructions that are not followed completely may result in serious medical risk, up to and including death, or upon the discretion of your surgeon and anesthesiologist your surgery may need to be rescheduled.    _x___ 1. Do not eat food after midnight the night before your procedure. You may drink clear liquids up to 2 hours before you are scheduled to arrive at the hospital for your procedure.  Do not drink clear liquids within 2 hours of your scheduled arrival to the hospital.  Clear liquids include  --Water or Apple juice without pulp  --Clear carbohydrate beverage such as ClearFast or Gatorade  --Black Coffee or Clear Tea (No milk, no creamers, do not add anything to the coffee or Tea   ____Ensure clear carbohydrate drink on the way to the hospital for bariatric patients  ____Ensure clear carbohydrate drink 3 hours before surgery for Dr Dwyane Luo patients if physician instructed.   No gum chewing or hard candies.     __x__ 2. No Alcohol for 24 hours before or after surgery.   __x__3. No Smoking or e-cigarettes for 24 prior to surgery.  Do not use any chewable tobacco products for at least 6 hour prior to surgery   ____  4. Bring all medications with you on the day of surgery if instructed.    __x__ 5. Notify your doctor if there is any change in your medical condition     (cold, fever, infections).    x___6. On the morning of surgery brush your teeth with toothpaste and water.  You may rinse your mouth with mouth wash if you wish.  Do not swallow any toothpaste or mouthwash.   Do not wear jewelry, make-up, hairpins, clips or nail polish.  Do not wear lotions, powders, or perfumes. You may wear  deodorant.  Do not shave 48 hours prior to surgery. Men may shave face and neck.  Do not bring valuables to the hospital.    [facility] is not responsible for any belongings or valuables.               Contacts, dentures or bridgework may not be worn into surgery.  Leave your suitcase in the car. After surgery it may be brought to your room.  For patients admitted to the hospital, discharge time is determined by your  treatment team.  _  Patients discharged the day of surgery will not be allowed to drive home.  You will need someone to drive you home and stay with you the night of your procedure.    Please read over the following fact sheets that you were given:   [facility] Preparing for Surgery    ____ Take anti-hypertensive listed below, cardiac, seizure, asthma,     anti-reflux and psychiatric medicines. These include:  1. NONE  2.  3.  4.  5.  6.  ____Fleets enema or Magnesium Citrate as directed.   ____ Use CHG Soap or sage wipes as directed on instruction sheet   ____ Use inhalers on the day of surgery and bring to hospital day of surgery  ____ Stop Metformin and Janumet 2 days prior to surgery.    ____ Take 1/2 of usual insulin dose  the night before surgery and none on the morning surgery.   ____ Follow recommendations from Cardiologist, Pulmonologist or PCP regarding stopping Aspirin, Coumadin, Plavix ,Eliquis, Effient, or Pradaxa, and Pletal.  X____Stop Anti-inflammatories such as Advil, Aleve, Ibuprofen, Motrin, Naproxen, Naprosyn, Goodies powders or aspirin products NOW- OK to take Tylenol .   ____ Stop supplements until after surgery.   ____ Bring C-Pap to the hospital.

## 2018-12-16 NOTE — Addendum Note (Signed)
Addended by: Caroleen Hamman F on: 12/16/2018 02:57 PM   Modules accepted: Orders

## 2018-12-19 ENCOUNTER — Ambulatory Visit: Payer: BLUE CROSS/BLUE SHIELD | Admitting: Anesthesiology

## 2018-12-19 ENCOUNTER — Encounter: Payer: Self-pay | Admitting: Anesthesiology

## 2018-12-19 ENCOUNTER — Encounter
Admission: RE | Disposition: A | Discharge status: Home / Self Care | Payer: Self-pay | Source: Home / Self Care | Attending: Surgery

## 2018-12-19 ENCOUNTER — Ambulatory Visit
Admission: RE | Admit: 2018-12-19 | Discharge: 2018-12-19 | Disposition: A | Discharge status: Home / Self Care | Payer: BLUE CROSS/BLUE SHIELD | Attending: Surgery | Admitting: Surgery

## 2018-12-19 ENCOUNTER — Other Ambulatory Visit: Payer: Self-pay

## 2018-12-19 DIAGNOSIS — Z170 Estrogen receptor positive status [ER+]: Secondary | ICD-10-CM | POA: Insufficient documentation

## 2018-12-19 DIAGNOSIS — K219 Gastro-esophageal reflux disease without esophagitis: Secondary | ICD-10-CM | POA: Diagnosis not present

## 2018-12-19 DIAGNOSIS — C50912 Malignant neoplasm of unspecified site of left female breast: Secondary | ICD-10-CM | POA: Diagnosis not present

## 2018-12-19 DIAGNOSIS — C50112 Malignant neoplasm of central portion of left female breast: Secondary | ICD-10-CM

## 2018-12-19 DIAGNOSIS — J45909 Unspecified asthma, uncomplicated: Secondary | ICD-10-CM | POA: Diagnosis not present

## 2018-12-19 DIAGNOSIS — Z87891 Personal history of nicotine dependence: Secondary | ICD-10-CM | POA: Insufficient documentation

## 2018-12-19 HISTORY — PX: BREAST LUMPECTOMY: SHX2

## 2018-12-19 LAB — POCT PREGNANCY, URINE: Preg Test, Ur: NEGATIVE

## 2018-12-19 SURGERY — BREAST LUMPECTOMY
Anesthesia: General | Laterality: Left

## 2018-12-19 MED ORDER — LIDOCAINE HCL (PF) 2 % IJ SOLN
INTRAMUSCULAR | Status: AC
  Filled 2018-12-19: qty 10

## 2018-12-19 MED ORDER — CEFAZOLIN SODIUM-DEXTROSE 2-4 GM/100ML-% IV SOLN
INTRAVENOUS | Status: AC
  Filled 2018-12-19: qty 100

## 2018-12-19 MED ORDER — ACETAMINOPHEN 10 MG/ML IV SOLN
INTRAVENOUS | Status: DC | PRN
  Administered 2018-12-19: 1000 mg via INTRAVENOUS

## 2018-12-19 MED ORDER — LIDOCAINE HCL (CARDIAC) PF 100 MG/5ML IV SOSY
PREFILLED_SYRINGE | INTRAVENOUS | Status: DC | PRN
  Administered 2018-12-19: 100 mg via INTRAVENOUS

## 2018-12-19 MED ORDER — ONDANSETRON HCL 4 MG/2ML IJ SOLN
INTRAMUSCULAR | Status: DC | PRN
  Administered 2018-12-19: 4 mg via INTRAVENOUS

## 2018-12-19 MED ORDER — MIDAZOLAM HCL 2 MG/2ML IJ SOLN
INTRAMUSCULAR | Status: AC
  Filled 2018-12-19: qty 2

## 2018-12-19 MED ORDER — FENTANYL CITRATE (PF) 100 MCG/2ML IJ SOLN
25.0000 ug | INTRAMUSCULAR | Status: DC | PRN
  Administered 2018-12-19 (×4): 25 ug via INTRAVENOUS

## 2018-12-19 MED ORDER — FAMOTIDINE 20 MG PO TABS
20.0000 mg | ORAL_TABLET | Freq: Once | ORAL | Status: AC
  Administered 2018-12-19: 20 mg via ORAL

## 2018-12-19 MED ORDER — FENTANYL CITRATE (PF) 100 MCG/2ML IJ SOLN
INTRAMUSCULAR | Status: AC
  Administered 2018-12-19: 25 ug via INTRAVENOUS
  Filled 2018-12-19: qty 2

## 2018-12-19 MED ORDER — ROCURONIUM BROMIDE 50 MG/5ML IV SOLN
INTRAVENOUS | Status: AC
  Filled 2018-12-19: qty 1

## 2018-12-19 MED ORDER — NEOMYCIN-POLYMYXIN B GU 40-200000 IR SOLN
Status: AC
  Filled 2018-12-19: qty 4

## 2018-12-19 MED ORDER — CEFAZOLIN SODIUM-DEXTROSE 2-4 GM/100ML-% IV SOLN: 2.0000 g | INTRAVENOUS | Status: DC

## 2018-12-19 MED ORDER — ACETAMINOPHEN 10 MG/ML IV SOLN
INTRAVENOUS | Status: AC
  Filled 2018-12-19: qty 100

## 2018-12-19 MED ORDER — DEXAMETHASONE SODIUM PHOSPHATE 10 MG/ML IJ SOLN
INTRAMUSCULAR | Status: DC | PRN
  Administered 2018-12-19: 8 mg via INTRAVENOUS

## 2018-12-19 MED ORDER — FAMOTIDINE 20 MG PO TABS
ORAL_TABLET | ORAL | Status: AC
  Administered 2018-12-19: 20 mg via ORAL
  Filled 2018-12-19: qty 1

## 2018-12-19 MED ORDER — PROPOFOL 10 MG/ML IV BOLUS
INTRAVENOUS | Status: AC
  Filled 2018-12-19: qty 20

## 2018-12-19 MED ORDER — BUPIVACAINE-EPINEPHRINE 0.25% -1:200000 IJ SOLN
INTRAMUSCULAR | Status: DC | PRN
  Administered 2018-12-19: 9 mL

## 2018-12-19 MED ORDER — FENTANYL CITRATE (PF) 100 MCG/2ML IJ SOLN
INTRAMUSCULAR | Status: AC
  Filled 2018-12-19: qty 2

## 2018-12-19 MED ORDER — CHLORHEXIDINE GLUCONATE CLOTH 2 % EX PADS: 6.0000 | MEDICATED_PAD | Freq: Once | CUTANEOUS | Status: DC

## 2018-12-19 MED ORDER — MIDAZOLAM HCL 2 MG/2ML IJ SOLN
INTRAMUSCULAR | Status: DC | PRN
  Administered 2018-12-19: 2 mg via INTRAVENOUS

## 2018-12-19 MED ORDER — PROPOFOL 10 MG/ML IV BOLUS
INTRAVENOUS | Status: DC | PRN
  Administered 2018-12-19: 150 mg via INTRAVENOUS

## 2018-12-19 MED ORDER — ISOSULFAN BLUE 1 % ~~LOC~~ SOLN
SUBCUTANEOUS | Status: AC
  Filled 2018-12-19: qty 5

## 2018-12-19 MED ORDER — BUPIVACAINE-EPINEPHRINE (PF) 0.25% -1:200000 IJ SOLN
INTRAMUSCULAR | Status: AC
  Filled 2018-12-19: qty 30

## 2018-12-19 MED ORDER — FENTANYL CITRATE (PF) 100 MCG/2ML IJ SOLN
INTRAMUSCULAR | Status: DC | PRN
  Administered 2018-12-19 (×2): 25 ug via INTRAVENOUS
  Administered 2018-12-19 (×3): 50 ug via INTRAVENOUS

## 2018-12-19 MED ORDER — LACTATED RINGERS IV SOLN
INTRAVENOUS | Status: DC
  Administered 2018-12-19: 11:00:00 via INTRAVENOUS

## 2018-12-19 MED ORDER — DEXAMETHASONE SODIUM PHOSPHATE 10 MG/ML IJ SOLN
INTRAMUSCULAR | Status: AC
  Filled 2018-12-19: qty 1

## 2018-12-19 MED ORDER — ONDANSETRON HCL 4 MG/2ML IJ SOLN: 4.0000 mg | Freq: Once | INTRAMUSCULAR | Status: DC | PRN

## 2018-12-19 MED ORDER — HYDROCODONE-ACETAMINOPHEN 5-325 MG PO TABS: 1.0000 | ORAL_TABLET | Freq: Four times a day (QID) | ORAL | 0 refills | Status: DC | PRN

## 2018-12-19 MED ORDER — PHENYLEPHRINE HCL 10 MG/ML IJ SOLN
INTRAMUSCULAR | Status: DC | PRN
  Administered 2018-12-19 (×4): 100 ug via INTRAVENOUS
  Administered 2018-12-19: 200 ug via INTRAVENOUS

## 2018-12-19 MED ORDER — ONDANSETRON HCL 4 MG/2ML IJ SOLN
INTRAMUSCULAR | Status: AC
  Filled 2018-12-19: qty 2

## 2018-12-19 SURGICAL SUPPLY — 70 items
APPLIER CLIP 9.375 SM OPEN (CLIP)
BINDER BREAST LRG (GAUZE/BANDAGES/DRESSINGS) IMPLANT
BINDER BREAST MEDIUM (GAUZE/BANDAGES/DRESSINGS) IMPLANT
BINDER BREAST XLRG (GAUZE/BANDAGES/DRESSINGS) IMPLANT
BINDER BREAST XXLRG (GAUZE/BANDAGES/DRESSINGS) IMPLANT
BIOPATCH RED 1 DISK 7.0 (GAUZE/BANDAGES/DRESSINGS) IMPLANT
BIOPATCH RED 1IN DISK 7.0MM (GAUZE/BANDAGES/DRESSINGS)
BLADE BOVIE TIP EXT 4 (BLADE) ×1 IMPLANT
BLADE SURG 15 STRL LF DISP TIS (BLADE) ×2 IMPLANT
BLADE SURG 15 STRL SS (BLADE) ×2
BULB RESERV EVAC DRAIN JP 100C (MISCELLANEOUS) IMPLANT
CANISTER SUCT 1200ML W/VALVE (MISCELLANEOUS) ×6 IMPLANT
CHLORAPREP W/TINT 26ML (MISCELLANEOUS) ×6 IMPLANT
CLIP APPLIE 9.375 SM OPEN (CLIP) ×1 IMPLANT
CNTNR SPEC 2.5X3XGRAD LEK (MISCELLANEOUS)
CONT SPEC 4OZ STER OR WHT (MISCELLANEOUS)
CONTAINER SPEC 2.5X3XGRAD LEK (MISCELLANEOUS) ×1 IMPLANT
COVER PROBE FLX POLY STRL (MISCELLANEOUS) ×1 IMPLANT
COVER WAND RF STERILE (DRAPES) ×1 IMPLANT
DECANTER SPIKE VIAL GLASS SM (MISCELLANEOUS) ×1 IMPLANT
DERMABOND ADVANCED (GAUZE/BANDAGES/DRESSINGS) ×2
DERMABOND ADVANCED .7 DNX12 (GAUZE/BANDAGES/DRESSINGS) ×3 IMPLANT
DRAIN CHANNEL 19F RND (DRAIN) IMPLANT
DRAPE CHEST BREAST 77X106 FENE (MISCELLANEOUS) ×1 IMPLANT
DRAPE INCISE IOBAN 66X45 STRL (DRAPES) ×1 IMPLANT
DRAPE LAPAROTOMY TRNSV 106X77 (MISCELLANEOUS) ×3 IMPLANT
ELECT CAUTERY BLADE TIP 2.5 (TIP)
ELECT REM PT RETURN 9FT ADLT (ELECTROSURGICAL) ×3
ELECTRODE CAUTERY BLDE TIP 2.5 (TIP) IMPLANT
ELECTRODE REM PT RTRN 9FT ADLT (ELECTROSURGICAL) ×2 IMPLANT
GLOVE BIO SURGEON STRL SZ 6.5 (GLOVE) ×4 IMPLANT
GLOVE BIO SURGEON STRL SZ7 (GLOVE) ×7 IMPLANT
GLOVE BIO SURGEONS STRL SZ 6.5 (GLOVE)
GLOVE INDICATOR 7.5 STRL GRN (GLOVE) ×7 IMPLANT
GOWN STRL REUS W/ TWL LRG LVL3 (GOWN DISPOSABLE) ×4 IMPLANT
GOWN STRL REUS W/TWL LRG LVL3 (GOWN DISPOSABLE) ×8
KIT TURNOVER KIT A (KITS) ×3 IMPLANT
LABEL OR SOLS (LABEL) ×3 IMPLANT
MARGIN MAP 10MM (MISCELLANEOUS) ×3 IMPLANT
NDL FILTER BLUNT 18X1 1/2 (NEEDLE) ×1 IMPLANT
NEEDLE FILTER BLUNT 18X 1/2SAF (NEEDLE)
NEEDLE FILTER BLUNT 18X1 1/2 (NEEDLE) IMPLANT
NEEDLE HYPO 22GX1.5 SAFETY (NEEDLE) ×4 IMPLANT
NS IRRIG 1000ML POUR BTL (IV SOLUTION) ×3 IMPLANT
PACK BASIN MAJOR ARMC (MISCELLANEOUS) ×1 IMPLANT
PACK BASIN MINOR ARMC (MISCELLANEOUS) ×3 IMPLANT
PAD ABD DERMACEA PRESS 5X9 (GAUZE/BANDAGES/DRESSINGS) ×2 IMPLANT
SLEVE PROBE SENORX GAMMA FIND (MISCELLANEOUS) ×1 IMPLANT
SPONGE LAP 18X18 RF (DISPOSABLE) ×5 IMPLANT
STRIP SUTURE WOUND CLOSURE 1/2 (SUTURE) ×2 IMPLANT
SUT ETHILON 3-0 FS-10 30 BLK (SUTURE) ×3
SUT MNCRL 3-0 UNDYED SH (SUTURE) ×2 IMPLANT
SUT MNCRL 4-0 (SUTURE) ×2
SUT MNCRL 4-0 27XMFL (SUTURE) ×1
SUT MNCRL+ 5-0 UNDYED PC-3 (SUTURE) ×2 IMPLANT
SUT MONOCRYL 3-0 UNDYED (SUTURE)
SUT MONOCRYL 5-0 (SUTURE)
SUT PDS AB 2-0 CT2 27 (SUTURE) IMPLANT
SUT SILK 2 0 SH (SUTURE) ×3 IMPLANT
SUT VIC AB 2-0 CT1 (SUTURE) ×3 IMPLANT
SUT VIC AB 3-0 SH 27 (SUTURE) ×4
SUT VIC AB 3-0 SH 27X BRD (SUTURE) ×2 IMPLANT
SUT VIC AB 4-0 PS2 18 (SUTURE) IMPLANT
SUTURE EHLN 3-0 FS-10 30 BLK (SUTURE) ×1 IMPLANT
SUTURE MNCRL 4-0 27XMF (SUTURE) ×3 IMPLANT
SYR 10ML LL (SYRINGE) ×2 IMPLANT
SYR 20CC LL (SYRINGE) ×3 IMPLANT
SYR BULB IRRIG 60ML STRL (SYRINGE) ×3 IMPLANT
TOWEL OR 17X26 4PK STRL BLUE (TOWEL DISPOSABLE) ×2 IMPLANT
WATER STERILE IRR 1000ML POUR (IV SOLUTION) ×3 IMPLANT

## 2018-12-19 NOTE — Anesthesia Procedure Notes (Signed)
Procedure Name: LMA Insertion Date/Time: 12/19/2018 12:44 PM Performed by: Hedda Slade, CRNA Pre-anesthesia Checklist: Patient identified, Patient being monitored, Timeout performed, Emergency Drugs available and Suction available Patient Re-evaluated:Patient Re-evaluated prior to induction Oxygen Delivery Method: Circle system utilized Preoxygenation: Pre-oxygenation with 100% oxygen Induction Type: IV induction Ventilation: Mask ventilation without difficulty LMA: LMA inserted LMA Size: 4.0 Tube type: Oral Number of attempts: 1 Placement Confirmation: positive ETCO2 and breath sounds checked- equal and bilateral Tube secured with: Tape Dental Injury: Teeth and Oropharynx as per pre-operative assessment

## 2018-12-19 NOTE — Interval H&P Note (Signed)
History and Physical Interval Note:  12/19/2018 11:26 AM Seen and examined. Here for re-excision of lumpectomy site. D/W the pt in detail about options including completion mastectomy. Risks, benefits and possible complications including having another positive margin and nipple necrosis. She understands and wishes to proceed. Melinda Holder  has presented today for surgery, with the diagnosis of Left Breast Cancer  The various methods of treatment have been discussed with the patient and family. After consideration of risks, benefits and other options for treatment, the patient has consented to  Procedure(s): BREAST LUMPECTOMY (Left) BREAST REDUCTION NO LIPOSUCTION (Left) as a surgical intervention .  The patient's history has been reviewed, patient examined, no change in status, stable for surgery.  I have reviewed the patient's chart and labs.  Questions were answered to the patient's satisfaction.     [place]

## 2018-12-19 NOTE — Anesthesia Preprocedure Evaluation (Signed)
Anesthesia Evaluation  Patient identified by MRN, date of birth, ID band Patient awake    Reviewed: Allergy & Precautions, NPO status , Patient's Chart, lab work & pertinent test results, reviewed documented beta blocker date and time   History of Anesthesia Complications (+) DIFFICULT AIRWAY  Airway Mallampati: III  TM Distance: >3 FB     Dental  (+) Chipped   Pulmonary asthma , former smoker,           Cardiovascular      Neuro/Psych    GI/Hepatic GERD  Controlled,  Endo/Other    Renal/GU      Musculoskeletal   Abdominal   Peds  Hematology  (+) anemia ,   Anesthesia Other Findings   Reproductive/Obstetrics                             Anesthesia Physical Anesthesia Plan  ASA: III  Anesthesia Plan: General   Post-op Pain Management:    Induction: Intravenous  PONV Risk Score and Plan:   Airway Management Planned: Oral ETT and LMA  Additional Equipment:   Intra-op Plan:   Post-operative Plan:   Informed Consent: I have reviewed the patients History and Physical, chart, labs and discussed the procedure including the risks, benefits and alternatives for the proposed anesthesia with the patient or authorized representative who has indicated his/her understanding and acceptance.     Plan Discussed with: CRNA  Anesthesia Plan Comments:         Anesthesia Quick Evaluation

## 2018-12-19 NOTE — Anesthesia Postprocedure Evaluation (Signed)
Anesthesia Post Note  Patient: Melinda Holder  Procedure(s) Performed: BREAST LUMPECTOMY (Left )  Patient location during evaluation: PACU Anesthesia Type: General Level of consciousness: awake and alert Pain management: pain level controlled Vital Signs Assessment: post-procedure vital signs reviewed and stable Respiratory status: spontaneous breathing, nonlabored ventilation, respiratory function stable and patient connected to nasal cannula oxygen Cardiovascular status: blood pressure returned to baseline and stable Postop Assessment: no apparent nausea or vomiting Anesthetic complications: no     Last Vitals:  Vitals:   12/19/18 1402 12/19/18 1417  BP: (!) 129/91 132/71  Pulse: 79   Resp: 17   Temp:    SpO2: 98%     Last Pain:  Vitals:   12/19/18 1417  TempSrc:   PainSc: 0-No pain                 Mabeline Varas S

## 2018-12-19 NOTE — Op Note (Signed)
DATE OF OPERATION: 12/19/2018  LOCATION: [facility]  PREOPERATIVE DIAGNOSIS: Left breast cancer with positive margin  POSTOPERATIVE DIAGNOSIS: Same  PROCEDURE: left partial mastectomy  SURGEON:  Caroleen Hamman, MD  ASSISTANT: Lyndee Leo Sanger Dillingham, DO  COMPLICATIONS: None  INDICATION: The patient, Melinda Holder, is a 47 y.o. female born on 08-May-1971, is here for treatment for breast cancer.   PROCEDURE DETAILS:  The patient was seen prior to surgery and marked.  The patient was taken to the OR under the direction of general surgery.  I assisted for the case in holding as needed for retraction.  This excision of the cancer has been dictated by Dr. Dahlia Byes.  I assisted in closure of the wound.  The deep layers were closed with the 3-0 Monocryl.  The skin was then closed with a combination of 4-0 and 5-0 Monocryl.  Derma bond was applied.  A sterile dressing and breast binder were applied. The patient was allowed to wake up and taken to recovery room in stable condition at the end of the case. The family was notified at the end of the case.

## 2018-12-19 NOTE — Anesthesia Post-op Follow-up Note (Signed)
Anesthesia QCDR form completed.        

## 2018-12-19 NOTE — Op Note (Signed)
  Pre-operative Diagnosis:  Left breast Ca, s/p lumpectomy with + margins   Post-operative Diagnosis: Same   Surgeon: Caroleen Hamman, MD FACS  Anesthesia: GETA  Procedure: Re-excision of lumpectomy cavity of theleft breast  Assistant: Dr. Marla Roe ( required due to the complexity of the case, doing a re-excision on a previous partail mastectomy with mastopexy)  Findings: Previous cavity  Estimated Blood Loss: Minimal         Drains: None         Specimens: partial mastectomy with labels, long lateral and short superior; sentinel node for frozen section       Complications: none                 Condition: Stable    Procedure Details  The patient was seen again in the Holding Room. The benefits, complications, treatment options, and expected outcomes were discussed with the patient. The risks of bleeding, infection, recurrence of symptoms, failure to resolve symptoms, hematoma, seroma, open wound, cosmetic deformity, and the need for further surgery were discussed.  The patient was taken to Operating Room, identified as Melinda Holder and the procedure verified.  A Time Out was held and the above information confirmed.  Prior to the induction of general anesthesia, antibiotic prophylaxis was administered. VTE prophylaxis was in place. Appropriate anesthesia was then administered and tolerated well. The chest was prepped with Chloraprep and draped in the sterile fashion. The patient was positioned in the supine position.   Periareolar incision created and flaps were developed. The previous cavity was identify and we re-excised an inferior breast tissue incorporating all the walls of the previous cavity.  The specimen was marked and I personally delivered it to the pathology dept to make sure the orientation was correct. Hemostasis obtained with electrocautery. The Cavity was closed in a two layered fashion with electrocautery. The nipple complex remained intact with good  perfusion. Dermabond was placed.   Patient was taken to the recovery room in stable condition      Caroleen Hamman, MD, FACS

## 2018-12-19 NOTE — OR Nursing (Signed)
Discharge instructions discussed with patient and husband. Both voice understanding.

## 2018-12-19 NOTE — Interval H&P Note (Signed)
History and Physical Interval Note:  12/19/2018 12:02 PM  Melinda Holder  has presented today for surgery, with the diagnosis of Left Breast Cancer  The various methods of treatment have been discussed with the patient and family. After consideration of risks, benefits and other options for treatment, the patient has consented to  Procedure(s): BREAST LUMPECTOMY (Left) BREAST REDUCTION NO LIPOSUCTION (Left) as a surgical intervention .  The patient's history has been reviewed, patient examined, no change in status, stable for surgery.  I have reviewed the patient's chart and labs.  Questions were answered to the patient's satisfaction.    A further discussion was had with the patient over the phone and again today.  Plan for repeat partial mastectomy.  If NAC does not survive then likely mastectomy.   [name] Robi Mitter

## 2018-12-19 NOTE — Discharge Instructions (Signed)
AMBULATORY SURGERY  °DISCHARGE INSTRUCTIONS ° ° °1) The drugs that you were given will stay in your system until tomorrow so for the next 24 hours you should not: ° °A) Drive an automobile °B) Make any legal decisions °C) Drink any alcoholic beverage ° ° °2) You may resume regular meals tomorrow.  Today it is better to start with liquids and gradually work up to solid foods. ° °You may eat anything you prefer, but it is better to start with liquids, then soup and crackers, and gradually work up to solid foods. ° ° °3) Please notify your doctor immediately if you have any unusual bleeding, trouble breathing, redness and pain at the surgery site, drainage, fever, or pain not relieved by medication. ° ° ° °4) Additional Instructions: ° ° ° ° ° ° ° °Please contact your physician with any problems or Same Day Surgery at [phone], Monday through Friday 6 am to 4 pm, or [place] [facility] number at [phone]. °

## 2018-12-19 NOTE — Transfer of Care (Signed)
Immediate Anesthesia Transfer of Care Note  Patient: Melinda Holder  Procedure(s) Performed: BREAST LUMPECTOMY (Left )  Patient Location: PACU  Anesthesia Type:General  Level of Consciousness: sedated  Airway & Oxygen Therapy: Patient Spontanous Breathing and Patient connected to face mask oxygen  Post-op Assessment: Report given to RN and Post -op Vital signs reviewed and stable  Post vital signs: Reviewed and stable  Last Vitals:  Vitals Value Taken Time  BP 116/64 12/19/2018  1:47 PM  Temp 36.7 C 12/19/2018  1:47 PM  Pulse 64 12/19/2018  1:47 PM  Resp 20 12/19/2018  1:47 PM  SpO2 100 % 12/19/2018  1:47 PM    Last Pain:  Vitals:   12/19/18 1102  TempSrc: Tympanic  PainSc: 0-No pain         Complications: No apparent anesthesia complications

## 2018-12-20 ENCOUNTER — Encounter: Payer: Self-pay | Admitting: Surgery

## 2018-12-20 LAB — SURGICAL PATHOLOGY

## 2018-12-21 NOTE — Telephone Encounter (Signed)
D/w the pt about path results. She is very Patent attorney. She is doing well otherwise w/o apparent complications

## 2018-12-22 ENCOUNTER — Ambulatory Visit: Payer: BLUE CROSS/BLUE SHIELD | Admitting: Radiation Oncology

## 2018-12-22 ENCOUNTER — Inpatient Hospital Stay: Payer: BLUE CROSS/BLUE SHIELD | Admitting: Oncology

## 2018-12-24 IMAGING — MG NEEDLE LOCALIZATION OF THE LEFT BREAST WITH MAMMO GUIDANCE
8 series · 8 of 8 positions shown · non-contrast
Comparison: Previous exams.

CLINICAL DATA: Needle localization was requested the left breast
prior to lumpectomy for recent diagnosis of breast cancer.

EXAM:
NEEDLE LOCALIZATION OF THE LEFT BREAST WITH MAMMO GUIDANCE

[L ML (1 of 5)]
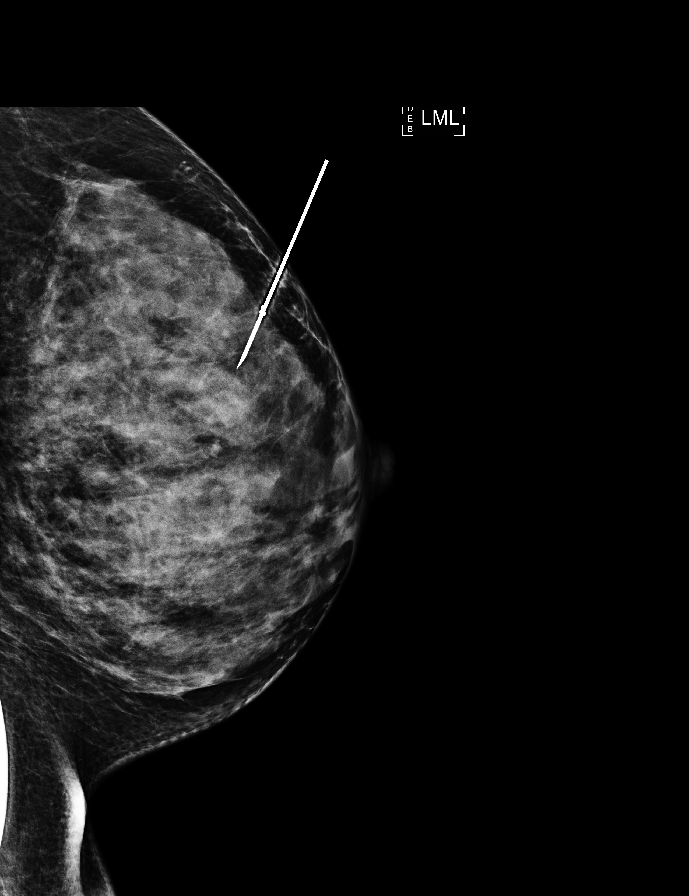

[L ML (2 of 5)]
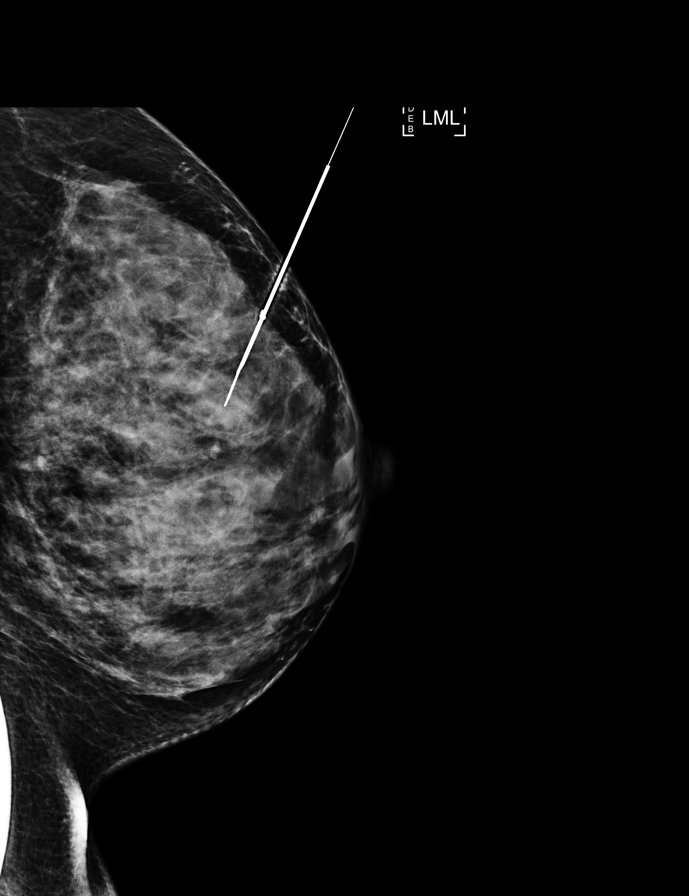

[L ML (3 of 5)]
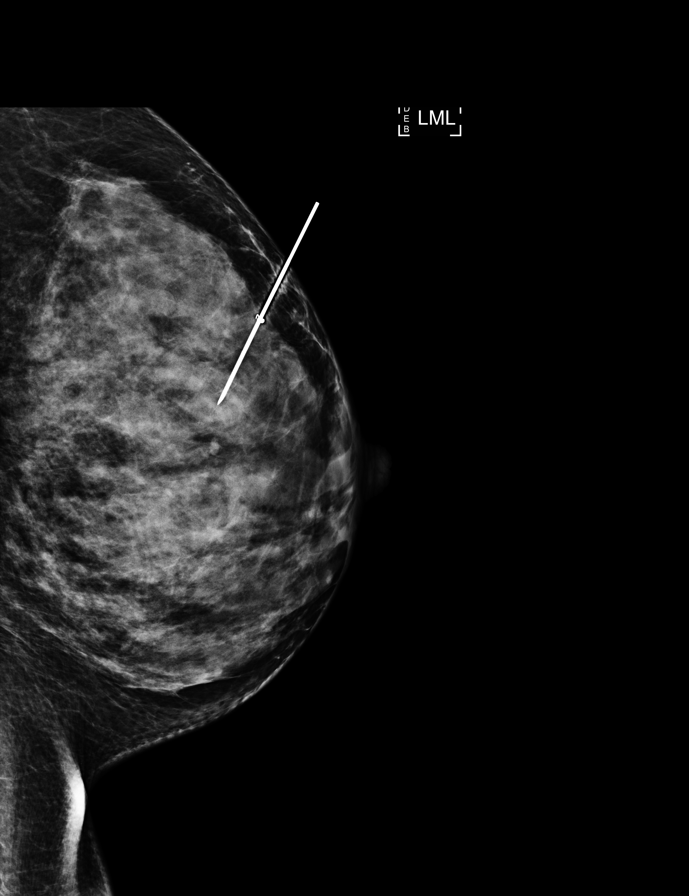

[L CC (1 of 3)]
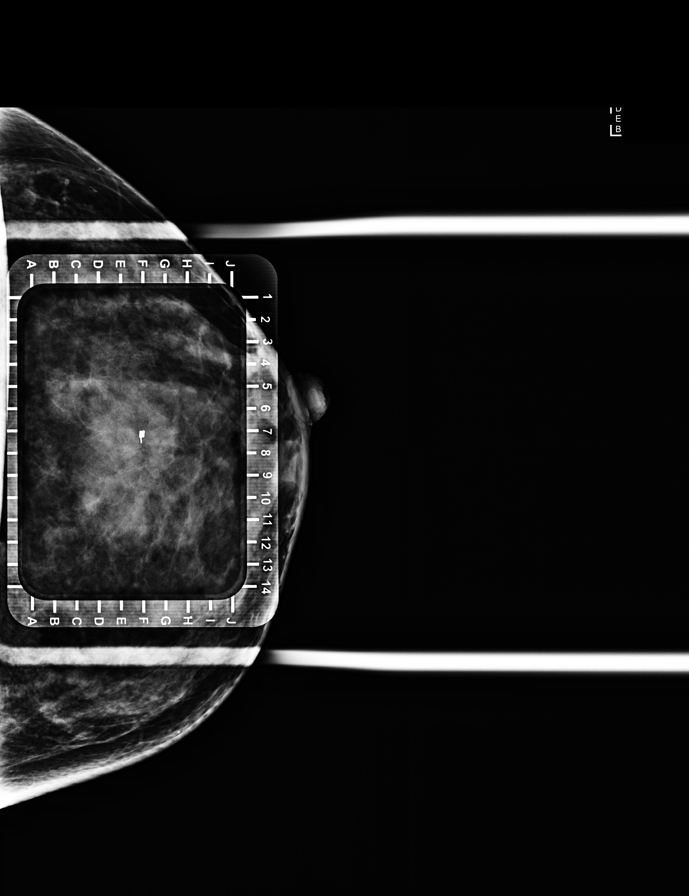

[L ML (4 of 5)]
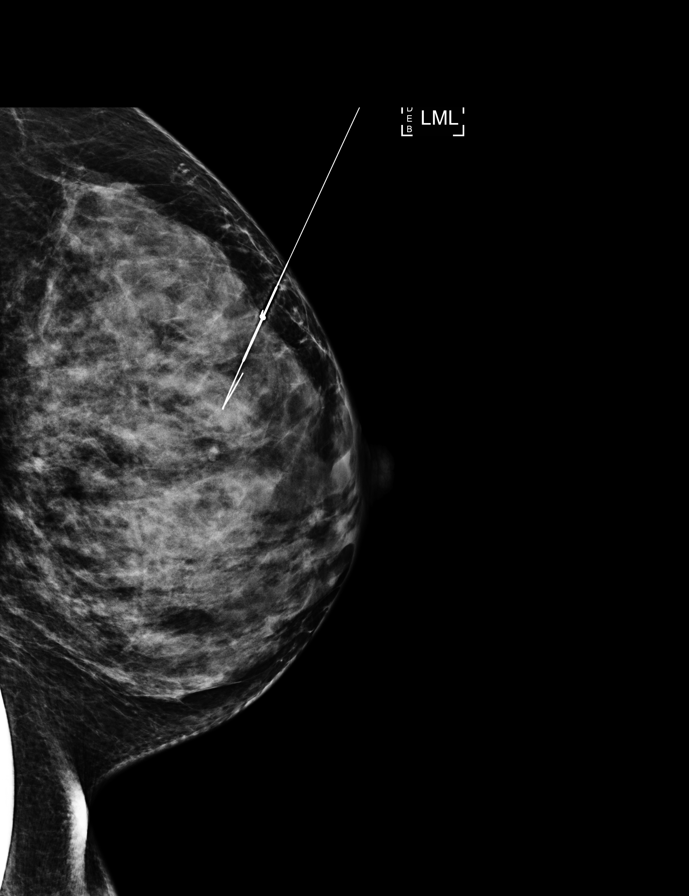

[L CC (2 of 3)]
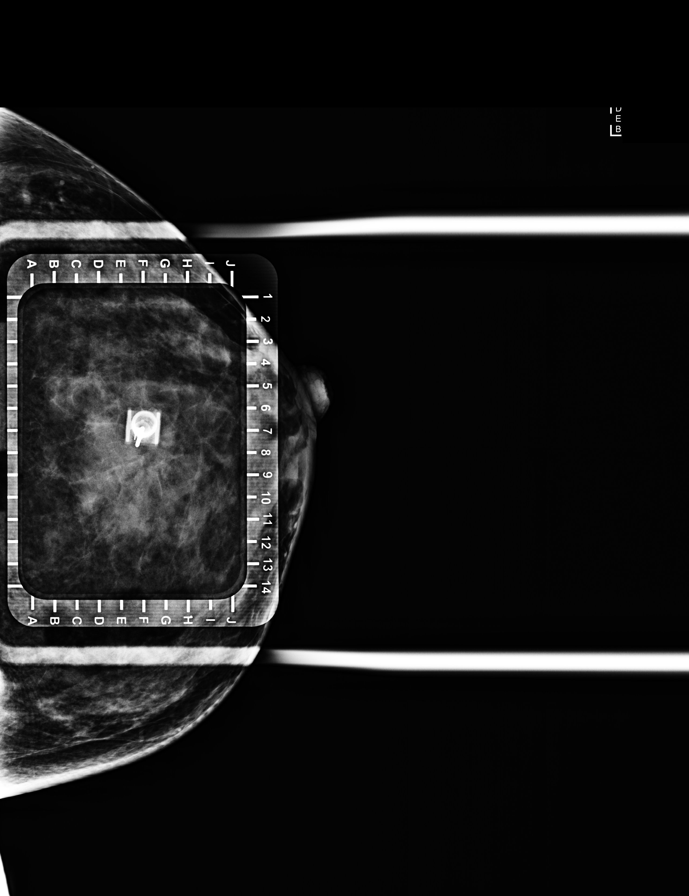

[L CC (3 of 3)]
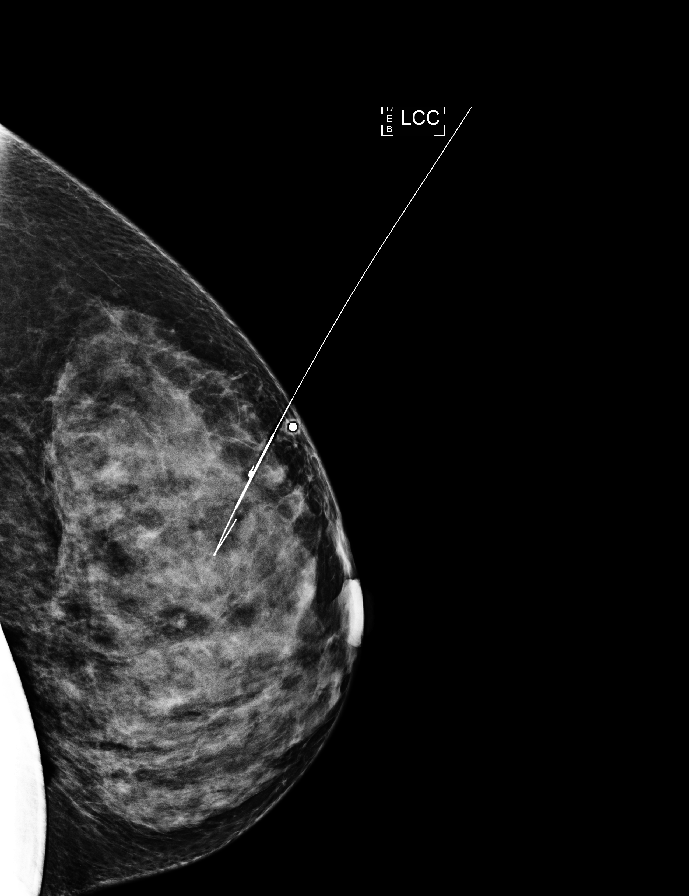

[L ML (5 of 5)]
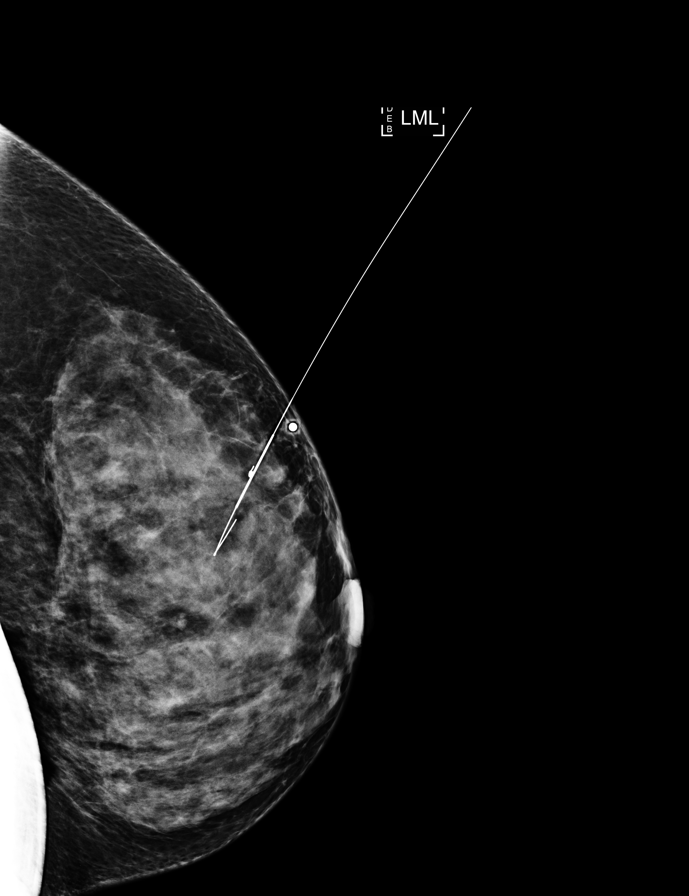

[8 of 8 positions shown; findings below may reference images not displayed]

FINDINGS: Patient presents for needle localization prior to lumpectomy. I met
with the patient and we discussed the procedure of needle
localization including benefits and alternatives. We discussed the
high likelihood of a successful procedure. We discussed the risks of
the procedure, including infection, bleeding, tissue injury, and
further surgery. Informed, written consent was given. The usual
time-out protocol was performed immediately prior to the procedure.

Using mammographic guidance, sterile technique, 1% lidocaine and a 5
cm modified Kopans needle, the coil shaped biopsy clip localized
using a superior approach. The images were marked for Dr. Fabier.
IMPRESSION: Needle localization left breast. No apparent complications.

## 2019-01-02 ENCOUNTER — Ambulatory Visit (INDEPENDENT_AMBULATORY_CARE_PROVIDER_SITE_OTHER): Payer: BLUE CROSS/BLUE SHIELD | Admitting: Surgery

## 2019-01-02 ENCOUNTER — Encounter: Payer: Self-pay | Admitting: Surgery

## 2019-01-02 ENCOUNTER — Other Ambulatory Visit: Payer: Self-pay

## 2019-01-02 VITALS — BP 122/80 | HR 84 | Temp 98.4°F | Ht 62.0 in | Wt 152.2 lb

## 2019-01-02 DIAGNOSIS — Z09 Encounter for follow-up examination after completed treatment for conditions other than malignant neoplasm: Secondary | ICD-10-CM

## 2019-01-02 NOTE — Progress Notes (Signed)
S/p lumpectomy re- excision for positive margin Final path d/w pt no residual ca She is doing well some soreness  PE NAD Wound healing well, no deformity. Nipple intact and alive  A/P Doing well Radiation rx No further surgical rx F/u oncology

## 2019-01-02 NOTE — Patient Instructions (Addendum)
Patient is to return to the office as needed.  Call the office with any questions or concerns. 

## 2019-01-03 ENCOUNTER — Other Ambulatory Visit: Payer: Self-pay

## 2019-01-03 ENCOUNTER — Encounter: Payer: Self-pay | Admitting: Oncology

## 2019-01-03 ENCOUNTER — Encounter: Payer: Self-pay | Admitting: Radiation Oncology

## 2019-01-03 ENCOUNTER — Inpatient Hospital Stay: Payer: BLUE CROSS/BLUE SHIELD | Attending: Oncology | Admitting: Oncology

## 2019-01-03 ENCOUNTER — Ambulatory Visit
Admission: RE | Admit: 2019-01-03 | Discharge: 2019-01-03 | Disposition: A | Discharge status: Home / Self Care | Payer: BLUE CROSS/BLUE SHIELD | Source: Ambulatory Visit | Attending: Radiation Oncology | Admitting: Radiation Oncology

## 2019-01-03 VITALS — BP 118/73 | HR 73 | Temp 98.0°F | Resp 16 | Ht 62.0 in | Wt 152.0 lb

## 2019-01-03 VITALS — BP 139/83 | HR 83 | Temp 98.0°F | Resp 18 | Wt 151.2 lb

## 2019-01-03 DIAGNOSIS — Z803 Family history of malignant neoplasm of breast: Secondary | ICD-10-CM | POA: Diagnosis not present

## 2019-01-03 DIAGNOSIS — Z79899 Other long term (current) drug therapy: Secondary | ICD-10-CM | POA: Insufficient documentation

## 2019-01-03 DIAGNOSIS — J45909 Unspecified asthma, uncomplicated: Secondary | ICD-10-CM | POA: Diagnosis not present

## 2019-01-03 DIAGNOSIS — Z170 Estrogen receptor positive status [ER+]: Secondary | ICD-10-CM | POA: Diagnosis not present

## 2019-01-03 DIAGNOSIS — Z87891 Personal history of nicotine dependence: Secondary | ICD-10-CM | POA: Insufficient documentation

## 2019-01-03 DIAGNOSIS — K219 Gastro-esophageal reflux disease without esophagitis: Secondary | ICD-10-CM | POA: Diagnosis not present

## 2019-01-03 DIAGNOSIS — C50812 Malignant neoplasm of overlapping sites of left female breast: Secondary | ICD-10-CM | POA: Insufficient documentation

## 2019-01-03 DIAGNOSIS — Z801 Family history of malignant neoplasm of trachea, bronchus and lung: Secondary | ICD-10-CM | POA: Diagnosis not present

## 2019-01-03 DIAGNOSIS — C50112 Malignant neoplasm of central portion of left female breast: Secondary | ICD-10-CM | POA: Insufficient documentation

## 2019-01-03 DIAGNOSIS — D649 Anemia, unspecified: Secondary | ICD-10-CM | POA: Insufficient documentation

## 2019-01-03 DIAGNOSIS — Z780 Asymptomatic menopausal state: Secondary | ICD-10-CM | POA: Diagnosis not present

## 2019-01-03 DIAGNOSIS — N809 Endometriosis, unspecified: Secondary | ICD-10-CM | POA: Diagnosis not present

## 2019-01-03 DIAGNOSIS — Z7189 Other specified counseling: Secondary | ICD-10-CM

## 2019-01-03 DIAGNOSIS — C50912 Malignant neoplasm of unspecified site of left female breast: Secondary | ICD-10-CM

## 2019-01-03 NOTE — Progress Notes (Signed)
Patient here for follow up. She had her initial consult with Dr. Baruch Gouty today. She reports occasional pain in her left breast.

## 2019-01-03 NOTE — Consult Note (Signed)
NEW PATIENT EVALUATION  Name: Melinda Holder  MRN: [id number]  Date:   01/03/2019     DOB: 1971/05/18   This 48 y.o. female patient presents to the clinic for initial evaluation of stage I (T1 CN 0 M0) invasive mammary carcinoma.of the left breast as was wide local excision and lymph node dissection tumor is ER/PR positive HER-2/neu not overexpressed with a low benefit to chemotherapy and Oncotype DX  REFERRING PHYSICIAN: Leone Haven, MD  CHIEF COMPLAINT:  Chief Complaint  Patient presents with  . Breast Cancer    DIAGNOSIS: The encounter diagnosis was 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[place]).   PREVIOUS INVESTIGATIONS:  Pathology reports reviewed Mammograms and ultrasound reviewed Clinical notes reviewed  HPI: patient is a 48 year old female who presented with a self discovered mass in her left breast.his was confirmed on all on mammogramas well as confirmed is a hypoechoic suspicious mass in the left breast at the o'clock position 3 cm from the nipple measuring 1.1 cm. Ultrasound guided biopsy was performed positive for ER/PR positive invasive mammary carcinoma. She underwent a wide local excision and axillary lymph node dissection.tumor measured 1.8 mm overall grade 1. Ductal carcinoma was present.and margin was close at 1 mm Margins were positive for invasive carcinoma and she did have a reexcision with clear margins. 7 lymph nodes were resected all negative for metastatic disease.she had Oncotype DX performed with a score of 20 showing a only 1% benefit to chemotherapy for her tumor. She is now referred to radiation oncology or consideration of treatment. She is doing well she specifically denies breast tenderness cough or bone pain.she is referred to radiation oncology for consideration of adjuvant treatment and is doing well.  PLANNED TREATMENT REGIMEN: hypofractionated left breast radiation  PAST MEDICAL HISTORY:  has a past medical history of Amblyopia  of eye, right, Anemia, Asthma, Cancer ([place]) (10/27/2018), Difficult intubation (11/03/2016), Endometriosis, and GERD (gastroesophageal reflux disease).    PAST SURGICAL HISTORY:  Past Surgical History:  Procedure Laterality Date  . ABLATION ON ENDOMETRIOSIS    . BREAST BIOPSY Left 10/28/2018   INVASIVE MAMMARY CARCINOMA  . BREAST EXCISIONAL BIOPSY Left 10/28/2018  . BREAST LUMPECTOMY Left 11/17/2018  . BREAST LUMPECTOMY Left 12/19/2018   Procedure: BREAST LUMPECTOMY;  Surgeon: Jules Husbands, MD;  Location: [facility];  Service: General;  Laterality: Left;  . BREAST LUMPECTOMY WITH NEEDLE LOCALIZATION Left 11/17/2018   Procedure: BREAST LUMPECTOMY WITH NEEDLE LOCALIZATION;  Surgeon: Jules Husbands, MD;  Location: [facility];  Service: General;  Laterality: Left;  . CLAVICLE SURGERY Left 10/2016  . COLONOSCOPY WITH PROPOFOL N/A 06/27/2018   Procedure: COLONOSCOPY WITH PROPOFOL;  Surgeon: Lucilla Lame, MD;  Location: [place];  Service: Endoscopy;  Laterality: N/A;  . ESOPHAGOGASTRODUODENOSCOPY (EGD) WITH PROPOFOL N/A 06/27/2018   Procedure: ESOPHAGOGASTRODUODENOSCOPY (EGD) WITH PROPOFOL;  Surgeon: Lucilla Lame, MD;  Location: [place];  Service: Endoscopy;  Laterality: N/A;  . EYE MUSCLE SURGERY Right    child  . ORIF CLAVICULAR FRACTURE Left 11/03/2016   Procedure: OPEN REDUCTION INTERNAL FIXATION (ORIF) CLAVICULAR FRACTURE;  Surgeon: Corky Mull, MD;  Location: [facility];  Service: Orthopedics;  Laterality: Left;  . removal of fibroid    . SENTINEL NODE BIOPSY Left 11/17/2018   Procedure: SENTINEL NODE BIOPSY;  Surgeon: Jules Husbands, MD;  Location: [facility];  Service: General;  Laterality: Left;    FAMILY HISTORY: family history includes Brain cancer (age of onset: 15) in her  paternal grandfather; Breast cancer (age of onset: 26) in her paternal aunt; Diabetes in her father; Hypertension in her father and mother; Lung cancer (age of onset: 63) in her paternal aunt; Lupus  in her mother; Lymphoma (age of onset: 16) in her mother.  SOCIAL HISTORY:  reports that she quit smoking about 3 years ago. Her smoking use included cigarettes. She has a 15.00 pack-year smoking history. She has never used smokeless tobacco. She reports current alcohol use of about 2.0 standard drinks of alcohol per week. She reports that she does not use drugs.  ALLERGIES: Patient has no known allergies.  MEDICATIONS:  Current Outpatient Medications  Medication Sig Dispense Refill  . HYDROcodone-acetaminophen (NORCO/VICODIN) 5-325 MG tablet Take 1 tablet by mouth every 6 (six) hours as needed for moderate pain. 20 tablet 0  . hydrOXYzine (ATARAX/VISTARIL) 10 MG tablet TAKE 1 TABLET BY MOUTH EVERYDAY AT BEDTIME (Patient taking differently: Take 10 mg by mouth at bedtime. ) 30 tablet 5  . ibuprofen (ADVIL,MOTRIN) 200 MG tablet Take 400 mg by mouth 2 (two) times daily as needed for headache or moderate pain.     Marland Kitchen omeprazole (PRILOSEC) 20 MG capsule Take 1 capsule (20 mg total) by mouth 2 (two) times daily before a meal. 60 capsule 1  . polyethylene glycol (MIRALAX / GLYCOLAX) packet Take 17 g by mouth daily as needed for moderate constipation.     No current facility-administered medications for this encounter.     ECOG PERFORMANCE STATUS:  0 - Asymptomatic  REVIEW OF SYSTEMS:  Patient denies any weight loss, fatigue, weakness, fever, chills or night sweats. Patient denies any loss of vision, blurred vision. Patient denies any ringing  of the ears or hearing loss. No irregular heartbeat. Patient denies heart murmur or history of fainting. Patient denies any chest pain or pain radiating to her upper extremities. Patient denies any shortness of breath, difficulty breathing at night, cough or hemoptysis. Patient denies any swelling in the lower legs. Patient denies any nausea vomiting, vomiting of blood, or coffee ground material in the vomitus. Patient denies any stomach pain. Patient states has  had normal bowel movements no significant constipation or diarrhea. Patient denies any dysuria, hematuria or significant nocturia. Patient denies any problems walking, swelling in the joints or loss of balance. Patient denies any skin changes, loss of hair or loss of weight. Patient denies any excessive worrying or anxiety or significant depression. Patient denies any problems with insomnia. Patient denies excessive thirst, polyuria, polydipsia. Patient denies any swollen glands, patient denies easy bruising or easy bleeding. Patient denies any recent infections, allergies or URI. Patient "s visual fields have not changed significantly in recent time.    PHYSICAL EXAM: BP 139/83   Pulse 83   Temp 98 F (36.7 C)   Resp 18   Wt 151 lb 3.8 oz (68.6 kg)   LMP 12/16/2018   BMI 27.66 kg/m  Left breast is wide local excision scar around the nipple areolar complex. No dominant mass or nodularity is noted in either breast in 2 positions examined. No axillary or supraclavicular adenopathy is identified.Well-developed well-nourished patient in NAD. HEENT reveals PERLA, EOMI, discs not visualized.  Oral cavity is clear. No oral mucosal lesions are identified. Neck is clear without evidence of cervical or supraclavicular adenopathy. Lungs are clear to A&P. Cardiac examination is essentially unremarkable with regular rate and rhythm without murmur rub or thrill. Abdomen is benign with no organomegaly or masses noted. Motor sensory and DTR levels  are equal and symmetric in the upper and lower extremities. Cranial nerves II through XII are grossly intact. Proprioception is intact. No peripheral adenopathy or edema is identified. No motor or sensory levels are noted. Crude visual fields are within normal range.  LABORATORY DATA: pathology reports reviewed    RADIOLOGY RESULTS:mammogram and ultrasound reviewed   IMPRESSION: stage I well-differentiated invasive mammary carcinoma left breast as was wide local  excision and axillary node lymph node dissection with low Oncotype DX score for adjuvant radiation therapy to her left breast  PLAN: at this time I believe we can go ahead with hypofractionated course of radiation therapy over 4 weeks. Would also boost her scar another 1600 cGy based on the initial close margin positive for invasive carcinoma and 1 mm margin for DCIS. Risks and benefits of treatment including skin reaction fatigue alteration of blood counts possible inclusion of superficial lung all were discussed in detail with the patient. I have personally separate ordered CT simulation for next week. Patient comprehend my treatment plan well. Patient also will benefit from anti-estrogen therapy after completion of radiation. I would like to take this opportunity to thank you for allowing me to participate in the care of your patient.Noreene Filbert, MD

## 2019-01-05 DIAGNOSIS — Z7189 Other specified counseling: Secondary | ICD-10-CM | POA: Insufficient documentation

## 2019-01-05 NOTE — Progress Notes (Signed)
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 She has received Feraheme in the past last in March 2019 and her hemoglobin had improved. Patient also has thrombocytosis dating back to 2017. Jak 2 mutation testing has been negative. Bone marrow biopsy not done at this time as she is less than 12 years of age with no prior history of thrombosis. She is now seeing me for her new diagnosis of breast cancer. Patient has not had any prior abnormal mammograms or breast biopsies palpated a breast mass at the 12 o'clock position of her left breast which was followed by screening and diagnostic mammogram. It showed 1.1 cm left breast mass at the 12 o'clock position 3 cm from the nipple. Multiple benign cysts in the upper quadrant of the left breast. No axillary lymphadenopathy. This was biopsied and biopsy showed 7 mm grade 2 invasive mammary carcinoma strongly ER PR positive and HER-2/neu negative. Patient will be seeing Dr. Nicholos Johns.  Final pathology showed invasive mammary carcinoma, 18 mm, grade 1.  0 out of 7 sentinel lymph nodes were positive for malignancy.  pT1cpN0.  Inferior margin was positive and patient underwent reexcision surgery with negative margins and no residual carcinoma  Interval history-patient is recovering from her reexcision surgery and reports some soreness around her surgery site but is  otherwise doing well.  She also met with radiation oncology and will likely start her radiation soon.  ECOG PS- 0 Pain scale- 0   Review of systems- Review of Systems  Constitutional: Negative for chills, fever, malaise/fatigue and weight loss.  HENT: Negative for congestion, ear discharge and nosebleeds.   Eyes: Negative for blurred vision.  Respiratory: Negative for cough, hemoptysis, sputum production, shortness of breath and wheezing.   Cardiovascular: Negative for chest pain, palpitations, orthopnea and claudication.  Gastrointestinal: Negative for abdominal pain, blood in stool, constipation, diarrhea, heartburn, melena, nausea and vomiting.  Genitourinary: Negative for dysuria, flank pain, frequency, hematuria and urgency.  Musculoskeletal: Negative for back pain, joint pain and myalgias.  Skin: Negative for rash.  Neurological: Negative for dizziness, tingling, focal weakness, seizures, weakness and headaches.  Endo/Heme/Allergies: Does not bruise/bleed easily.  Psychiatric/Behavioral: Negative for depression and suicidal ideas. The patient does not have insomnia.        No Known Allergies   Past Medical History:  Diagnosis Date  . Amblyopia of eye, right   . Anemia   . Asthma    childhood  . Cancer ([place]) 10/27/2018   left breast  . Difficult intubation 11/03/2016   DL x 2 with MAC 3 (CRNA and MD), very anterior. DL x 2 with Glidescope #3 Lo pro, bougie utilized. Easy mask.  . Endometriosis   . GERD (gastroesophageal reflux disease)      Past Surgical History:  Procedure Laterality Date  . ABLATION ON ENDOMETRIOSIS    . BREAST BIOPSY Left 10/28/2018   INVASIVE MAMMARY CARCINOMA  . BREAST EXCISIONAL BIOPSY Left 10/28/2018  . BREAST  LUMPECTOMY Left 11/17/2018  . BREAST LUMPECTOMY Left 12/19/2018   Procedure: BREAST LUMPECTOMY;  Surgeon: Jules Husbands, MD;  Location: [facility];  Service: General;  Laterality: Left;  . BREAST LUMPECTOMY WITH NEEDLE LOCALIZATION  Left 11/17/2018   Procedure: BREAST LUMPECTOMY WITH NEEDLE LOCALIZATION;  Surgeon: Jules Husbands, MD;  Location: [facility];  Service: General;  Laterality: Left;  . CLAVICLE SURGERY Left 10/2016  . COLONOSCOPY WITH PROPOFOL N/A 06/27/2018   Procedure: COLONOSCOPY WITH PROPOFOL;  Surgeon: Lucilla Lame, MD;  Location: [place];  Service: Endoscopy;  Laterality: N/A;  . ESOPHAGOGASTRODUODENOSCOPY (EGD) WITH PROPOFOL N/A 06/27/2018   Procedure: ESOPHAGOGASTRODUODENOSCOPY (EGD) WITH PROPOFOL;  Surgeon: Lucilla Lame, MD;  Location: [place];  Service: Endoscopy;  Laterality: N/A;  . EYE MUSCLE SURGERY Right    child  . ORIF CLAVICULAR FRACTURE Left 11/03/2016   Procedure: OPEN REDUCTION INTERNAL FIXATION (ORIF) CLAVICULAR FRACTURE;  Surgeon: Corky Mull, MD;  Location: [facility];  Service: Orthopedics;  Laterality: Left;  . removal of fibroid    . SENTINEL NODE BIOPSY Left 11/17/2018   Procedure: SENTINEL NODE BIOPSY;  Surgeon: Jules Husbands, MD;  Location: [facility];  Service: General;  Laterality: Left;    Social History   Socioeconomic History  . Marital status: Married    Spouse name: 0  . Number of children: 0  . Years of education: Not on file  . Highest education level: Associate degree: academic program  Occupational History  . Occupation: Librarian, academic    Comment: Actuary  Social Needs  . Financial resource strain: Not on file  . Food insecurity:    Worry: Not on file    Inability: Not on file  . Transportation needs:    Medical: Not on file    Non-medical: Not on file  Tobacco Use  . Smoking status: Former Smoker    Packs/day: 1.00    Years: 15.00    Pack years: 15.00    Types: Cigarettes    Last attempt to quit: 02/21/2015    Years since quitting: 3.8  . Smokeless tobacco: Never Used  Substance and Sexual Activity  . Alcohol use: Yes    Alcohol/week: 2.0 standard drinks    Types: 2 Cans of beer per week    Comment: Occasional   . Drug use:  No  . Sexual activity: Yes    Partners: Male    Comment: 1 female  Lifestyle  . Physical activity:    Days per week: Not on file    Minutes per session: Not on file  . Stress: Not on file  Relationships  . Social connections:    Talks on phone: Not on file    Gets together: Not on file    Attends religious service: Not on file    Active member of club or organization: Not on file    Attends meetings of clubs or organizations: Not on file    Relationship status: Not on file  . Intimate partner violence:    Fear of current or ex partner: Not on file    Emotionally abused: Not on file    Physically abused: Not on file    Forced sexual activity: Not on file  Other Topics Concern  . 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 Lupus Mother   . Hypertension Mother   . Lymphoma Mother 93       B cell; currently 80  . Hypertension Father        currently 71  . Diabetes Father   . Breast cancer Paternal Aunt 46       deceased 27  . Lung cancer Paternal Aunt 42       deceased 26  . Brain cancer Paternal Grandfather 87       deceased 20     Current Outpatient Medications:  .  HYDROcodone-acetaminophen (NORCO/VICODIN) 5-325 MG tablet, Take 1 tablet by mouth every 6 (six) hours as needed for moderate pain., Disp: 20 tablet, Rfl: 0 .  hydrOXYzine (ATARAX/VISTARIL) 10 MG tablet, TAKE 1 TABLET BY MOUTH EVERYDAY AT BEDTIME (Patient taking differently: Take 10 mg by mouth at bedtime. ), Disp: 30 tablet, Rfl: 5 .  ibuprofen (ADVIL,MOTRIN) 200 MG tablet, Take 400 mg by mouth 2 (two) times daily as needed for headache or moderate pain. , Disp: , Rfl:  .  omeprazole (PRILOSEC) 20 MG capsule, Take 1 capsule (20 mg total) by mouth 2 (two) times daily before a meal., Disp: 60 capsule, Rfl: 1 .   polyethylene glycol (MIRALAX / GLYCOLAX) packet, Take 17 g by mouth daily as needed for moderate constipation., Disp: , Rfl:   Physical exam:  Vitals:   01/03/19 1507 01/03/19 1511  BP:  118/73  Pulse:  73  Resp: 16   Temp:  98 F (36.7 C)  TempSrc:  Oral  Weight: 152 lb (68.9 kg)   Height: _0  (1.575 m)    Physical Exam HENT:     Head: Normocephalic and atraumatic.  Eyes:     Pupils: Pupils are equal, round, and reactive to light.  Neck:     Musculoskeletal: Normal range of motion.  Cardiovascular:     Rate and Rhythm: Normal rate and regular rhythm.     Heart sounds: Normal heart sounds.  Pulmonary:     Effort: Pulmonary effort is normal.     Breath sounds: Normal breath sounds.  Abdominal:     General: Bowel sounds are normal.     Palpations: Abdomen is soft.  Skin:    General: Skin is warm and dry.  Neurological:     Mental Status: She is alert and oriented to person, place, and time.   Patient is status post left lumpectomy with reconstruction.  Surgical site is healing well and there is no evidence of infection ulceration or necrosis.   CMP Latest Ref Rng & Units 06/07/2018  Glucose 70 - 99 mg/dL 95  BUN 6 - 23 mg/dL 13  Creatinine 0.40 - 1.20 mg/dL 0.73  Sodium 135 - 145 mEq/L 139  Potassium 3.5 - 5.1 mEq/L 4.8  Chloride 96 - 112 mEq/L 104  CO2 19 - 32 mEq/L 30  Calcium 8.4 - 10.5 mg/dL 9.6  Total Protein 6.5 - 8.1 g/dL -  Total Bilirubin 0.3 - 1.2 mg/dL -  Alkaline Phos 38 - 126 U/L -  AST 15 - 41 U/L -  ALT 14 - 54 U/L -   CBC Latest Ref Rng & Units 11/09/2018  WBC 4.0 - 10.5 K/uL 6.4  Hemoglobin 12.0 - 15.0 g/dL 13.6  Hematocrit 36.0 - 46.0 % 41.0  Platelets 150 - 400 K/uL 582(H)      Assessment and plan- Patient  is a 48 y.o. female with stage Ia invasive mammary carcinoma of the left breast pT1 cpN0 cM0 ER PR positive HER-2/neu negative status post lumpectomy.  She is here to discuss Oncotype testing results and further management  I  discussed the results of her Oncotype testing which shows an intermediate score of 20.  Given that she is less than 81 years of age and menopausal this translates to an absolute chemotherapy benefit of about 1.6% given that her score is between 16-20.  I do not think that she would benefit from adjuvant chemotherapy with a score of 20.  We could certainly try OS plus AI to provide additional benefit from hormone therapy given that she is premenopausal.  Discussed risks and benefits of monthly Lupron including all but not limited to fatigue, mood swings, headaches.  Patient understands and agrees to proceed.  She will get her first dose of Lupron next week when she comes for radiation.  I will plan to continue Lupron for about 2 months before starting Arimidex upon completion of radiation treatment.  Discussed risks and benefits of Arimidex including all but not limited to fatigue, hot flashes, arthralgias and worsening bone health.  She will need to take calcium and vitamin D 1200 mg and 800 international units respectively with Arimidex. Treatment will be given with a curative intent.   I will see her back in 5 weeks time for her next lupron injection and see how she is tolerating it and talk about bone density scan at that time   Visit Diagnosis 1. Goals of care, counseling/discussion   2. Malignant neoplasm of left female breast, unspecified estrogen receptor status, unspecified site of breast ([place])      Dr. Randa Evens, MD, MPH [facility] [phone] 01/05/2019 9:05 AM

## 2019-01-09 ENCOUNTER — Ambulatory Visit
Admission: RE | Admit: 2019-01-09 | Discharge: 2019-01-09 | Disposition: A | Discharge status: Home / Self Care | Payer: BLUE CROSS/BLUE SHIELD | Source: Ambulatory Visit | Attending: Radiation Oncology | Admitting: Radiation Oncology

## 2019-01-09 DIAGNOSIS — C50112 Malignant neoplasm of central portion of left female breast: Secondary | ICD-10-CM | POA: Diagnosis not present

## 2019-01-09 DIAGNOSIS — Z510 Encounter for antineoplastic radiation therapy: Secondary | ICD-10-CM | POA: Insufficient documentation

## 2019-01-09 DIAGNOSIS — Z170 Estrogen receptor positive status [ER+]: Secondary | ICD-10-CM | POA: Diagnosis not present

## 2019-01-10 ENCOUNTER — Inpatient Hospital Stay: Payer: BLUE CROSS/BLUE SHIELD

## 2019-01-10 ENCOUNTER — Encounter: Payer: Self-pay | Admitting: *Deleted

## 2019-01-10 DIAGNOSIS — C50812 Malignant neoplasm of overlapping sites of left female breast: Secondary | ICD-10-CM | POA: Diagnosis not present

## 2019-01-10 DIAGNOSIS — C50112 Malignant neoplasm of central portion of left female breast: Secondary | ICD-10-CM | POA: Diagnosis not present

## 2019-01-10 DIAGNOSIS — D509 Iron deficiency anemia, unspecified: Secondary | ICD-10-CM

## 2019-01-10 DIAGNOSIS — Z510 Encounter for antineoplastic radiation therapy: Secondary | ICD-10-CM | POA: Diagnosis not present

## 2019-01-10 DIAGNOSIS — Z780 Asymptomatic menopausal state: Secondary | ICD-10-CM | POA: Diagnosis not present

## 2019-01-10 DIAGNOSIS — Z170 Estrogen receptor positive status [ER+]: Secondary | ICD-10-CM | POA: Diagnosis not present

## 2019-01-10 MED ORDER — LEUPROLIDE ACETATE 3.75 MG IM KIT
3.7500 mg | PACK | INTRAMUSCULAR | Status: DC
  Administered 2019-01-10: 3.75 mg via INTRAMUSCULAR
  Filled 2019-01-10: qty 3.75

## 2019-01-11 ENCOUNTER — Encounter: Payer: Self-pay | Admitting: Oncology

## 2019-01-13 ENCOUNTER — Other Ambulatory Visit: Payer: Self-pay | Admitting: *Deleted

## 2019-01-13 DIAGNOSIS — C50112 Malignant neoplasm of central portion of left female breast: Secondary | ICD-10-CM

## 2019-01-16 ENCOUNTER — Ambulatory Visit
Admission: RE | Admit: 2019-01-16 | Discharge: 2019-01-16 | Disposition: A | Discharge status: Home / Self Care | Payer: BLUE CROSS/BLUE SHIELD | Source: Ambulatory Visit | Attending: Radiation Oncology | Admitting: Radiation Oncology

## 2019-01-16 DIAGNOSIS — Z170 Estrogen receptor positive status [ER+]: Secondary | ICD-10-CM | POA: Diagnosis not present

## 2019-01-16 DIAGNOSIS — Z510 Encounter for antineoplastic radiation therapy: Secondary | ICD-10-CM | POA: Diagnosis not present

## 2019-01-16 DIAGNOSIS — C50112 Malignant neoplasm of central portion of left female breast: Secondary | ICD-10-CM | POA: Diagnosis not present

## 2019-01-17 ENCOUNTER — Ambulatory Visit
Admission: RE | Admit: 2019-01-17 | Discharge: 2019-01-17 | Disposition: A | Discharge status: Home / Self Care | Payer: BLUE CROSS/BLUE SHIELD | Source: Ambulatory Visit | Attending: Radiation Oncology | Admitting: Radiation Oncology

## 2019-01-17 DIAGNOSIS — Z510 Encounter for antineoplastic radiation therapy: Secondary | ICD-10-CM | POA: Diagnosis not present

## 2019-01-17 DIAGNOSIS — C50112 Malignant neoplasm of central portion of left female breast: Secondary | ICD-10-CM | POA: Diagnosis not present

## 2019-01-17 DIAGNOSIS — Z170 Estrogen receptor positive status [ER+]: Secondary | ICD-10-CM | POA: Diagnosis not present

## 2019-01-18 ENCOUNTER — Ambulatory Visit
Admission: RE | Admit: 2019-01-18 | Discharge: 2019-01-18 | Disposition: A | Discharge status: Home / Self Care | Payer: BLUE CROSS/BLUE SHIELD | Source: Ambulatory Visit | Attending: Radiation Oncology | Admitting: Radiation Oncology

## 2019-01-18 DIAGNOSIS — C50112 Malignant neoplasm of central portion of left female breast: Secondary | ICD-10-CM | POA: Diagnosis not present

## 2019-01-18 DIAGNOSIS — Z510 Encounter for antineoplastic radiation therapy: Secondary | ICD-10-CM | POA: Diagnosis not present

## 2019-01-18 DIAGNOSIS — Z170 Estrogen receptor positive status [ER+]: Secondary | ICD-10-CM | POA: Diagnosis not present

## 2019-01-19 ENCOUNTER — Ambulatory Visit
Admission: RE | Admit: 2019-01-19 | Discharge: 2019-01-19 | Disposition: A | Discharge status: Home / Self Care | Payer: BLUE CROSS/BLUE SHIELD | Source: Ambulatory Visit | Attending: Radiation Oncology | Admitting: Radiation Oncology

## 2019-01-19 DIAGNOSIS — Z510 Encounter for antineoplastic radiation therapy: Secondary | ICD-10-CM | POA: Diagnosis not present

## 2019-01-19 DIAGNOSIS — Z170 Estrogen receptor positive status [ER+]: Secondary | ICD-10-CM | POA: Diagnosis not present

## 2019-01-19 DIAGNOSIS — C50112 Malignant neoplasm of central portion of left female breast: Secondary | ICD-10-CM | POA: Diagnosis not present

## 2019-01-20 ENCOUNTER — Ambulatory Visit
Admission: RE | Admit: 2019-01-20 | Discharge: 2019-01-20 | Disposition: A | Discharge status: Home / Self Care | Payer: BLUE CROSS/BLUE SHIELD | Source: Ambulatory Visit | Attending: Radiation Oncology | Admitting: Radiation Oncology

## 2019-01-20 DIAGNOSIS — Z170 Estrogen receptor positive status [ER+]: Secondary | ICD-10-CM | POA: Diagnosis not present

## 2019-01-20 DIAGNOSIS — Z510 Encounter for antineoplastic radiation therapy: Secondary | ICD-10-CM | POA: Diagnosis not present

## 2019-01-20 DIAGNOSIS — C50112 Malignant neoplasm of central portion of left female breast: Secondary | ICD-10-CM | POA: Diagnosis not present

## 2019-01-23 ENCOUNTER — Ambulatory Visit: Payer: BLUE CROSS/BLUE SHIELD

## 2019-01-24 ENCOUNTER — Ambulatory Visit
Admission: RE | Admit: 2019-01-24 | Discharge: 2019-01-24 | Disposition: A | Discharge status: Home / Self Care | Payer: BLUE CROSS/BLUE SHIELD | Source: Ambulatory Visit | Attending: Radiation Oncology | Admitting: Radiation Oncology

## 2019-01-24 DIAGNOSIS — Z170 Estrogen receptor positive status [ER+]: Secondary | ICD-10-CM | POA: Diagnosis not present

## 2019-01-24 DIAGNOSIS — Z510 Encounter for antineoplastic radiation therapy: Secondary | ICD-10-CM | POA: Diagnosis not present

## 2019-01-24 DIAGNOSIS — C50112 Malignant neoplasm of central portion of left female breast: Secondary | ICD-10-CM | POA: Diagnosis not present

## 2019-01-25 ENCOUNTER — Ambulatory Visit
Admission: RE | Admit: 2019-01-25 | Discharge: 2019-01-25 | Disposition: A | Discharge status: Home / Self Care | Payer: BLUE CROSS/BLUE SHIELD | Source: Ambulatory Visit | Attending: Radiation Oncology | Admitting: Radiation Oncology

## 2019-01-25 DIAGNOSIS — Z510 Encounter for antineoplastic radiation therapy: Secondary | ICD-10-CM | POA: Diagnosis not present

## 2019-01-25 DIAGNOSIS — Z170 Estrogen receptor positive status [ER+]: Secondary | ICD-10-CM | POA: Diagnosis not present

## 2019-01-25 DIAGNOSIS — C50112 Malignant neoplasm of central portion of left female breast: Secondary | ICD-10-CM | POA: Diagnosis not present

## 2019-01-26 ENCOUNTER — Ambulatory Visit: Payer: BLUE CROSS/BLUE SHIELD | Admitting: Advanced Practice Midwife

## 2019-01-26 ENCOUNTER — Ambulatory Visit
Admission: RE | Admit: 2019-01-26 | Discharge: 2019-01-26 | Disposition: A | Discharge status: Home / Self Care | Payer: BLUE CROSS/BLUE SHIELD | Source: Ambulatory Visit | Attending: Radiation Oncology | Admitting: Radiation Oncology

## 2019-01-26 DIAGNOSIS — C50112 Malignant neoplasm of central portion of left female breast: Secondary | ICD-10-CM | POA: Diagnosis not present

## 2019-01-26 DIAGNOSIS — Z170 Estrogen receptor positive status [ER+]: Secondary | ICD-10-CM | POA: Diagnosis not present

## 2019-01-26 DIAGNOSIS — Z510 Encounter for antineoplastic radiation therapy: Secondary | ICD-10-CM | POA: Diagnosis not present

## 2019-01-27 ENCOUNTER — Ambulatory Visit
Admission: RE | Admit: 2019-01-27 | Discharge: 2019-01-27 | Disposition: A | Discharge status: Home / Self Care | Payer: BLUE CROSS/BLUE SHIELD | Source: Ambulatory Visit | Attending: Radiation Oncology | Admitting: Radiation Oncology

## 2019-01-27 DIAGNOSIS — Z170 Estrogen receptor positive status [ER+]: Secondary | ICD-10-CM | POA: Diagnosis not present

## 2019-01-27 DIAGNOSIS — Z510 Encounter for antineoplastic radiation therapy: Secondary | ICD-10-CM | POA: Diagnosis not present

## 2019-01-27 DIAGNOSIS — C50112 Malignant neoplasm of central portion of left female breast: Secondary | ICD-10-CM | POA: Diagnosis not present

## 2019-01-30 ENCOUNTER — Inpatient Hospital Stay: Payer: BLUE CROSS/BLUE SHIELD

## 2019-01-30 ENCOUNTER — Ambulatory Visit
Admission: RE | Admit: 2019-01-30 | Discharge: 2019-01-30 | Disposition: A | Discharge status: Home / Self Care | Payer: BLUE CROSS/BLUE SHIELD | Source: Ambulatory Visit | Attending: Radiation Oncology | Admitting: Radiation Oncology

## 2019-01-30 DIAGNOSIS — N9489 Other specified conditions associated with female genital organs and menstrual cycle: Secondary | ICD-10-CM

## 2019-01-30 DIAGNOSIS — C50112 Malignant neoplasm of central portion of left female breast: Secondary | ICD-10-CM | POA: Diagnosis not present

## 2019-01-30 DIAGNOSIS — D509 Iron deficiency anemia, unspecified: Secondary | ICD-10-CM

## 2019-01-30 DIAGNOSIS — C50812 Malignant neoplasm of overlapping sites of left female breast: Secondary | ICD-10-CM

## 2019-01-30 DIAGNOSIS — Z170 Estrogen receptor positive status [ER+]: Secondary | ICD-10-CM

## 2019-01-30 DIAGNOSIS — Z5111 Encounter for antineoplastic chemotherapy: Secondary | ICD-10-CM | POA: Insufficient documentation

## 2019-01-30 DIAGNOSIS — Z510 Encounter for antineoplastic radiation therapy: Secondary | ICD-10-CM | POA: Insufficient documentation

## 2019-01-30 LAB — CBC
HCT: 39.2 % (ref 36.0–46.0)
Hemoglobin: 12.7 g/dL (ref 12.0–15.0)
MCH: 29.7 pg (ref 26.0–34.0)
MCHC: 32.4 g/dL (ref 30.0–36.0)
MCV: 91.6 fL (ref 80.0–100.0)
Platelets: 406 10*3/uL — ABNORMAL HIGH (ref 150–400)
RBC: 4.28 MIL/uL (ref 3.87–5.11)
RDW: 13.7 % (ref 11.5–15.5)
WBC: 4.6 10*3/uL (ref 4.0–10.5)
nRBC: 0 % (ref 0.0–0.2)

## 2019-01-31 ENCOUNTER — Ambulatory Visit
Admission: RE | Admit: 2019-01-31 | Discharge: 2019-01-31 | Disposition: A | Discharge status: Home / Self Care | Payer: BLUE CROSS/BLUE SHIELD | Source: Ambulatory Visit | Attending: Radiation Oncology | Admitting: Radiation Oncology

## 2019-01-31 DIAGNOSIS — Z170 Estrogen receptor positive status [ER+]: Secondary | ICD-10-CM | POA: Diagnosis not present

## 2019-01-31 DIAGNOSIS — Z510 Encounter for antineoplastic radiation therapy: Secondary | ICD-10-CM | POA: Diagnosis not present

## 2019-01-31 DIAGNOSIS — C50112 Malignant neoplasm of central portion of left female breast: Secondary | ICD-10-CM | POA: Diagnosis not present

## 2019-02-01 ENCOUNTER — Ambulatory Visit
Admission: RE | Admit: 2019-02-01 | Discharge: 2019-02-01 | Disposition: A | Discharge status: Home / Self Care | Payer: BLUE CROSS/BLUE SHIELD | Source: Ambulatory Visit | Attending: Radiation Oncology | Admitting: Radiation Oncology

## 2019-02-01 DIAGNOSIS — C50112 Malignant neoplasm of central portion of left female breast: Secondary | ICD-10-CM | POA: Diagnosis not present

## 2019-02-01 DIAGNOSIS — Z510 Encounter for antineoplastic radiation therapy: Secondary | ICD-10-CM | POA: Diagnosis not present

## 2019-02-01 DIAGNOSIS — Z170 Estrogen receptor positive status [ER+]: Secondary | ICD-10-CM | POA: Diagnosis not present

## 2019-02-02 ENCOUNTER — Ambulatory Visit
Admission: RE | Admit: 2019-02-02 | Discharge: 2019-02-02 | Disposition: A | Discharge status: Home / Self Care | Payer: BLUE CROSS/BLUE SHIELD | Source: Ambulatory Visit | Attending: Radiation Oncology | Admitting: Radiation Oncology

## 2019-02-02 DIAGNOSIS — Z510 Encounter for antineoplastic radiation therapy: Secondary | ICD-10-CM | POA: Diagnosis not present

## 2019-02-02 DIAGNOSIS — Z170 Estrogen receptor positive status [ER+]: Secondary | ICD-10-CM | POA: Diagnosis not present

## 2019-02-02 DIAGNOSIS — C50112 Malignant neoplasm of central portion of left female breast: Secondary | ICD-10-CM | POA: Diagnosis not present

## 2019-02-03 ENCOUNTER — Ambulatory Visit
Admission: RE | Admit: 2019-02-03 | Discharge: 2019-02-03 | Disposition: A | Discharge status: Home / Self Care | Payer: BLUE CROSS/BLUE SHIELD | Source: Ambulatory Visit | Attending: Radiation Oncology | Admitting: Radiation Oncology

## 2019-02-03 ENCOUNTER — Encounter: Payer: Self-pay | Admitting: Oncology

## 2019-02-03 DIAGNOSIS — Z510 Encounter for antineoplastic radiation therapy: Secondary | ICD-10-CM | POA: Diagnosis not present

## 2019-02-03 DIAGNOSIS — Z170 Estrogen receptor positive status [ER+]: Secondary | ICD-10-CM | POA: Diagnosis not present

## 2019-02-03 DIAGNOSIS — C50112 Malignant neoplasm of central portion of left female breast: Secondary | ICD-10-CM | POA: Diagnosis not present

## 2019-02-06 ENCOUNTER — Ambulatory Visit
Admission: RE | Admit: 2019-02-06 | Discharge: 2019-02-06 | Disposition: A | Discharge status: Home / Self Care | Payer: BLUE CROSS/BLUE SHIELD | Source: Ambulatory Visit | Attending: Radiation Oncology | Admitting: Radiation Oncology

## 2019-02-06 ENCOUNTER — Other Ambulatory Visit: Payer: BLUE CROSS/BLUE SHIELD

## 2019-02-06 DIAGNOSIS — C50112 Malignant neoplasm of central portion of left female breast: Secondary | ICD-10-CM | POA: Diagnosis not present

## 2019-02-06 DIAGNOSIS — Z510 Encounter for antineoplastic radiation therapy: Secondary | ICD-10-CM | POA: Diagnosis not present

## 2019-02-06 DIAGNOSIS — Z170 Estrogen receptor positive status [ER+]: Secondary | ICD-10-CM | POA: Diagnosis not present

## 2019-02-07 ENCOUNTER — Encounter: Payer: Self-pay | Admitting: Oncology

## 2019-02-07 ENCOUNTER — Ambulatory Visit
Admission: RE | Admit: 2019-02-07 | Discharge: 2019-02-07 | Disposition: A | Discharge status: Home / Self Care | Payer: BLUE CROSS/BLUE SHIELD | Source: Ambulatory Visit | Attending: Radiation Oncology | Admitting: Radiation Oncology

## 2019-02-07 ENCOUNTER — Inpatient Hospital Stay (HOSPITAL_BASED_OUTPATIENT_CLINIC_OR_DEPARTMENT_OTHER): Payer: BLUE CROSS/BLUE SHIELD | Admitting: Oncology

## 2019-02-07 ENCOUNTER — Inpatient Hospital Stay: Payer: BLUE CROSS/BLUE SHIELD

## 2019-02-07 VITALS — BP 109/70 | HR 80 | Temp 98.3°F | Resp 18 | Wt 150.5 lb

## 2019-02-07 DIAGNOSIS — Z853 Personal history of malignant neoplasm of breast: Secondary | ICD-10-CM

## 2019-02-07 DIAGNOSIS — Z5181 Encounter for therapeutic drug level monitoring: Secondary | ICD-10-CM

## 2019-02-07 DIAGNOSIS — Z08 Encounter for follow-up examination after completed treatment for malignant neoplasm: Secondary | ICD-10-CM

## 2019-02-07 DIAGNOSIS — Z170 Estrogen receptor positive status [ER+]: Secondary | ICD-10-CM

## 2019-02-07 DIAGNOSIS — D509 Iron deficiency anemia, unspecified: Secondary | ICD-10-CM | POA: Diagnosis not present

## 2019-02-07 DIAGNOSIS — Z510 Encounter for antineoplastic radiation therapy: Secondary | ICD-10-CM | POA: Diagnosis not present

## 2019-02-07 DIAGNOSIS — Z79818 Long term (current) use of other agents affecting estrogen receptors and estrogen levels: Principal | ICD-10-CM

## 2019-02-07 DIAGNOSIS — C50812 Malignant neoplasm of overlapping sites of left female breast: Secondary | ICD-10-CM | POA: Diagnosis not present

## 2019-02-07 DIAGNOSIS — N9489 Other specified conditions associated with female genital organs and menstrual cycle: Secondary | ICD-10-CM

## 2019-02-07 DIAGNOSIS — C50112 Malignant neoplasm of central portion of left female breast: Secondary | ICD-10-CM | POA: Diagnosis not present

## 2019-02-07 MED ORDER — LEUPROLIDE ACETATE 3.75 MG IM KIT
3.7500 mg | PACK | INTRAMUSCULAR | Status: DC
  Administered 2019-02-07: 3.75 mg via INTRAMUSCULAR
  Filled 2019-02-07: qty 3.75

## 2019-02-07 NOTE — Progress Notes (Signed)
Hematology/Oncology Consult note [facility]  Telephone:([phone] [phone]  Patient Care Team: Leone Haven, MD as PCP - General (Family Medicine)   Name of the patient: Melinda Holder  [id number]  08/17/1971   Date of visit: 02/07/19  Diagnosis- stage IAinvasive mammary carcinoma of the left breast grade 1 PT 1 APN 0CM0 ER PR positive HER-2/neu negative status post lumpectomy   Chief complaint/ Reason for visit-routine follow-up visit for breast cancer for second dose of Lupron  Heme/Onc history:  Patient is a 48 year old premenopausal woman who sees me for her iron deficiency anemia. She has received Feraheme in the past last in March 2019 and her hemoglobin had improved. Patient also has thrombocytosis dating back to 2017. Jak 2 mutation testing has been negative. Bone marrow biopsy not done at this time as she is less than 62 years of age with no prior history of thrombosis. She is now seeing me for her new diagnosis of breast cancer. Patient has not had any prior abnormal mammograms or breast biopsies palpated a breast mass at the 12 o'clock position of her left breast which was followed by screening and diagnostic mammogram. It showed 1.1 cm left breast mass at the 12 o'clock position 3 cm from the nipple. Multiple benign cysts in the upper quadrant of the left breast. No axillary lymphadenopathy. This was biopsied and biopsy showed 7 mm grade 2 invasive mammary carcinoma strongly ER PR positive and HER-2/neu negative. Patient will be seeing Dr. Nicholos Johns.  Final pathology showed invasive mammary carcinoma, 18 mm, grade 1. 0 out of 7 sentinel lymph nodes were positive for malignancy. pT1cpN0. Inferior margin was positive and patient underwent reexcision surgery with negative margins and no residual carcinoma.  Oncotype testing came back with intermediate risk with a score of 20 translating to a absolute benefit of  chemotherapy of about 1.6%.  OS plus AI was recommended without adjuvant chemotherapy.  Patient is currently undergoing radiation treatment and received her first dose of Lupron in January 2020  Interval history-patient tolerated her Lupron well except for mild intermittent hot flashes and mild self-limited headaches.  She did however have her menstrual cycles lasting for about 2 weeks which ended 2 weeks ago.  Radiation treatment is currently coming along well  ECOG PS- 0 Pain scale- 0   Review of systems- Review of Systems  Constitutional: Negative for chills, fever, malaise/fatigue and weight loss.  HENT: Negative for congestion, ear discharge and nosebleeds.   Eyes: Negative for blurred vision.  Respiratory: Negative for cough, hemoptysis, sputum production, shortness of breath and wheezing.   Cardiovascular: Negative for chest pain, palpitations, orthopnea and claudication.  Gastrointestinal: Negative for abdominal pain, blood in stool, constipation, diarrhea, heartburn, melena, nausea and vomiting.  Genitourinary: Negative for dysuria, flank pain, frequency, hematuria and urgency.  Musculoskeletal: Negative for back pain, joint pain and myalgias.  Skin: Negative for rash.  Neurological: Negative for dizziness, tingling, focal weakness, seizures, weakness and headaches.  Endo/Heme/Allergies: Does not bruise/bleed easily.       Hot flashes  Psychiatric/Behavioral: Negative for depression and suicidal ideas. The patient does not have insomnia.        No Known Allergies   Past Medical History:  Diagnosis Date  . Amblyopia of eye, right   . Anemia   . Asthma    childhood  . Cancer ([place]) 10/27/2018   left breast  . Difficult intubation 11/03/2016   DL x 2 with MAC 3 (CRNA and  MD), very anterior. DL x 2 with Glidescope #3 Lo pro, bougie utilized. Easy mask.  . Endometriosis   . GERD (gastroesophageal reflux disease)      Past Surgical History:  Procedure Laterality Date    . ABLATION ON ENDOMETRIOSIS    . BREAST BIOPSY Left 10/28/2018   INVASIVE MAMMARY CARCINOMA  . BREAST EXCISIONAL BIOPSY Left 10/28/2018  . BREAST LUMPECTOMY Left 11/17/2018  . BREAST LUMPECTOMY Left 12/19/2018   Procedure: BREAST LUMPECTOMY;  Surgeon: Jules Husbands, MD;  Location: [facility];  Service: General;  Laterality: Left;  . BREAST LUMPECTOMY WITH NEEDLE LOCALIZATION Left 11/17/2018   Procedure: BREAST LUMPECTOMY WITH NEEDLE LOCALIZATION;  Surgeon: Jules Husbands, MD;  Location: [facility];  Service: General;  Laterality: Left;  . CLAVICLE SURGERY Left 10/2016  . COLONOSCOPY WITH PROPOFOL N/A 06/27/2018   Procedure: COLONOSCOPY WITH PROPOFOL;  Surgeon: Lucilla Lame, MD;  Location: [place];  Service: Endoscopy;  Laterality: N/A;  . ESOPHAGOGASTRODUODENOSCOPY (EGD) WITH PROPOFOL N/A 06/27/2018   Procedure: ESOPHAGOGASTRODUODENOSCOPY (EGD) WITH PROPOFOL;  Surgeon: Lucilla Lame, MD;  Location: [place];  Service: Endoscopy;  Laterality: N/A;  . EYE MUSCLE SURGERY Right    child  . ORIF CLAVICULAR FRACTURE Left 11/03/2016   Procedure: OPEN REDUCTION INTERNAL FIXATION (ORIF) CLAVICULAR FRACTURE;  Surgeon: Corky Mull, MD;  Location: [facility];  Service: Orthopedics;  Laterality: Left;  . removal of fibroid    . SENTINEL NODE BIOPSY Left 11/17/2018   Procedure: SENTINEL NODE BIOPSY;  Surgeon: Jules Husbands, MD;  Location: [facility];  Service: General;  Laterality: Left;    Social History   Socioeconomic History  . Marital status: Married    Spouse name: 0  . Number of children: 0  . Years of education: Not on file  . Highest education level: Associate degree: academic program  Occupational History  . Occupation: Librarian, academic    Comment: Actuary  Social Needs  . Financial resource strain: Not on file  . Food insecurity:    Worry: Not on file    Inability: Not on file  . Transportation needs:    Medical: Not on file    Non-medical: Not on file   Tobacco Use  . Smoking status: Former Smoker    Packs/day: 1.00    Years: 15.00    Pack years: 15.00    Types: Cigarettes    Last attempt to quit: 02/21/2015    Years since quitting: 3.9  . Smokeless tobacco: Never Used  Substance and Sexual Activity  . Alcohol use: Yes    Alcohol/week: 2.0 standard drinks    Types: 2 Cans of beer per week    Comment: Occasional   . Drug use: No  . Sexual activity: Yes    Partners: Male    Comment: 1 female  Lifestyle  . Physical activity:    Days per week: Not on file    Minutes per session: Not on file  . Stress: Not on file  Relationships  . Social connections:    Talks on phone: Not on file    Gets together: Not on file    Attends religious service: Not on file    Active member of club or organization: Not on file    Attends meetings of clubs or organizations: Not on file    Relationship status: Not on file  . Intimate partner violence:    Fear of current or ex partner: Not on file    Emotionally abused:  Not on file    Physically abused: Not on file    Forced sexual activity: Not on file  Other Topics Concern  . 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 Lupus Mother   . Hypertension Mother   . Lymphoma Mother 53       B cell; currently 34  . Hypertension Father        currently 74  . Diabetes Father   . Breast cancer Paternal Aunt 48       deceased 70  . Lung cancer Paternal Aunt 70       deceased 4  . Brain cancer Paternal Grandfather 77       deceased 27     Current Outpatient Medications:  .  hydrOXYzine (ATARAX/VISTARIL) 10 MG tablet, TAKE 1 TABLET BY MOUTH EVERYDAY AT BEDTIME (Patient taking differently: Take 10 mg by mouth at bedtime. ), Disp: 30 tablet, Rfl: 5 .  ibuprofen (ADVIL,MOTRIN) 200 MG tablet, Take 400 mg  by mouth 2 (two) times daily as needed for headache or moderate pain. , Disp: , Rfl:  .  omeprazole (PRILOSEC) 20 MG capsule, Take 1 capsule (20 mg total) by mouth 2 (two) times daily before a meal., Disp: 60 capsule, Rfl: 1 .  polyethylene glycol (MIRALAX / GLYCOLAX) packet, Take 17 g by mouth daily as needed for moderate constipation., Disp: , Rfl:  .  HYDROcodone-acetaminophen (NORCO/VICODIN) 5-325 MG tablet, Take 1 tablet by mouth every 6 (six) hours as needed for moderate pain. (Patient not taking: Reported on 02/07/2019), Disp: 20 tablet, Rfl: 0  Physical exam:  Vitals:   02/07/19 0940  BP: 109/70  Pulse: 80  Resp: 18  Temp: 98.3 F (36.8 C)  TempSrc: Tympanic  Weight: 150 lb 8 oz (68.3 kg)   Physical Exam Constitutional:      General: She is not in acute distress. HENT:     Head: Normocephalic and atraumatic.  Eyes:     Pupils: Pupils are equal, round, and reactive to light.  Neck:     Musculoskeletal: Normal range of motion.  Cardiovascular:     Rate and Rhythm: Normal rate and regular rhythm.     Heart sounds: Normal heart sounds.  Pulmonary:     Effort: Pulmonary effort is normal.     Breath sounds: Normal breath sounds.  Abdominal:     General: Bowel sounds are normal.     Palpations: Abdomen is soft.  Skin:    General: Skin is warm and dry.  Neurological:     Mental Status: She is alert and oriented to person, place, and time.      CMP Latest Ref Rng & Units 06/07/2018  Glucose 70 - 99 mg/dL 95  BUN 6 - 23 mg/dL 13  Creatinine 0.40 - 1.20 mg/dL 0.73  Sodium 135 - 145 mEq/L 139  Potassium 3.5 - 5.1 mEq/L 4.8  Chloride 96 - 112 mEq/L 104  CO2 19 - 32 mEq/L 30  Calcium 8.4 - 10.5 mg/dL 9.6  Total Protein 6.5 - 8.1 g/dL -  Total Bilirubin 0.3 - 1.2 mg/dL -  Alkaline Phos 38 - 126 U/L -  AST 15 - 41 U/L -  ALT 14 -  54 U/L -   CBC Latest Ref Rng & Units 01/30/2019  WBC 4.0 - 10.5 K/uL 4.6  Hemoglobin 12.0 - 15.0 g/dL 12.7  Hematocrit 36.0 - 46.0 % 39.2    Platelets 150 - 400 K/uL 406(H)     Assessment and plan- Patient is a 48 y.o. female with stage Ia invasive mammary carcinoma of the left breast pT1 cpN0 cM0 ER PR positive HER-2/neu negative status post lumpectomy.  Oncotype score came back as intermediate risk of 20.  OS plus AI was recommended and she is here for routine follow-up.  She is currently undergoing radiation treatment   she is currently on ovarian suppression with Lupron and Arimidex has not been started yet.  She will receive her second dose of Lupron today and I will see her back in 1 month's time with [facility] and estradiol levels.  If her hormone levels are adequately suppressed, I will proceed with Arimidex at that time.  Patient completes her radiation treatment in 2 weeks time  Patient also has a history of iron deficiency anemia and I will be doing CBC ferritin and iron studies as well as B12 levels with next visit   Visit Diagnosis 1. Encounter for monitoring Lupron therapy   2. Encounter for follow-up surveillance of breast cancer   3. Iron deficiency anemia, unspecified iron deficiency anemia type      Dr. Randa Evens, MD, MPH [facility] [phone] 02/07/2019 10:02 AM

## 2019-02-07 NOTE — Addendum Note (Signed)
Addended by: Luella Cook on: 02/07/2019 10:12 AM   Modules accepted: Orders

## 2019-02-07 NOTE — Progress Notes (Signed)
Pt in for follow up and lupron injection. Denies any difficulties or concerns today.

## 2019-02-08 ENCOUNTER — Ambulatory Visit
Admission: RE | Admit: 2019-02-08 | Discharge: 2019-02-08 | Disposition: A | Discharge status: Home / Self Care | Payer: BLUE CROSS/BLUE SHIELD | Source: Ambulatory Visit | Attending: Radiation Oncology | Admitting: Radiation Oncology

## 2019-02-08 DIAGNOSIS — C50112 Malignant neoplasm of central portion of left female breast: Secondary | ICD-10-CM | POA: Diagnosis not present

## 2019-02-08 DIAGNOSIS — Z510 Encounter for antineoplastic radiation therapy: Secondary | ICD-10-CM | POA: Diagnosis not present

## 2019-02-08 DIAGNOSIS — Z170 Estrogen receptor positive status [ER+]: Secondary | ICD-10-CM | POA: Diagnosis not present

## 2019-02-09 ENCOUNTER — Ambulatory Visit
Admission: RE | Admit: 2019-02-09 | Discharge: 2019-02-09 | Disposition: A | Discharge status: Home / Self Care | Payer: BLUE CROSS/BLUE SHIELD | Source: Ambulatory Visit | Attending: Radiation Oncology | Admitting: Radiation Oncology

## 2019-02-09 ENCOUNTER — Ambulatory Visit: Payer: BLUE CROSS/BLUE SHIELD | Admitting: Oncology

## 2019-02-09 ENCOUNTER — Ambulatory Visit: Payer: BLUE CROSS/BLUE SHIELD

## 2019-02-09 DIAGNOSIS — Z510 Encounter for antineoplastic radiation therapy: Secondary | ICD-10-CM | POA: Diagnosis not present

## 2019-02-09 DIAGNOSIS — C50112 Malignant neoplasm of central portion of left female breast: Secondary | ICD-10-CM | POA: Diagnosis not present

## 2019-02-10 ENCOUNTER — Other Ambulatory Visit: Payer: Self-pay | Admitting: Family Medicine

## 2019-02-10 ENCOUNTER — Ambulatory Visit
Admission: RE | Admit: 2019-02-10 | Discharge: 2019-02-10 | Disposition: A | Discharge status: Home / Self Care | Payer: BLUE CROSS/BLUE SHIELD | Source: Ambulatory Visit | Attending: Radiation Oncology | Admitting: Radiation Oncology

## 2019-02-10 DIAGNOSIS — C50112 Malignant neoplasm of central portion of left female breast: Secondary | ICD-10-CM | POA: Diagnosis not present

## 2019-02-10 DIAGNOSIS — Z510 Encounter for antineoplastic radiation therapy: Secondary | ICD-10-CM | POA: Diagnosis not present

## 2019-02-13 ENCOUNTER — Ambulatory Visit
Admission: RE | Admit: 2019-02-13 | Discharge: 2019-02-13 | Disposition: A | Discharge status: Home / Self Care | Payer: BLUE CROSS/BLUE SHIELD | Source: Ambulatory Visit | Attending: Radiation Oncology | Admitting: Radiation Oncology

## 2019-02-13 DIAGNOSIS — Z510 Encounter for antineoplastic radiation therapy: Secondary | ICD-10-CM | POA: Diagnosis not present

## 2019-02-13 DIAGNOSIS — C50112 Malignant neoplasm of central portion of left female breast: Secondary | ICD-10-CM | POA: Diagnosis not present

## 2019-02-14 ENCOUNTER — Ambulatory Visit
Admission: RE | Admit: 2019-02-14 | Discharge: 2019-02-14 | Disposition: A | Discharge status: Home / Self Care | Payer: BLUE CROSS/BLUE SHIELD | Source: Ambulatory Visit | Attending: Radiation Oncology | Admitting: Radiation Oncology

## 2019-02-14 ENCOUNTER — Other Ambulatory Visit: Payer: Self-pay | Admitting: *Deleted

## 2019-02-14 DIAGNOSIS — C50112 Malignant neoplasm of central portion of left female breast: Secondary | ICD-10-CM | POA: Diagnosis not present

## 2019-02-14 DIAGNOSIS — Z510 Encounter for antineoplastic radiation therapy: Secondary | ICD-10-CM | POA: Diagnosis not present

## 2019-02-14 DIAGNOSIS — Z170 Estrogen receptor positive status [ER+]: Secondary | ICD-10-CM | POA: Diagnosis not present

## 2019-02-14 MED ORDER — AZITHROMYCIN 250 MG PO TABS: ORAL_TABLET | ORAL | 0 refills | Status: DC

## 2019-02-15 ENCOUNTER — Ambulatory Visit
Admission: RE | Admit: 2019-02-15 | Discharge: 2019-02-15 | Disposition: A | Discharge status: Home / Self Care | Payer: BLUE CROSS/BLUE SHIELD | Source: Ambulatory Visit | Attending: Radiation Oncology | Admitting: Radiation Oncology

## 2019-02-15 DIAGNOSIS — C50112 Malignant neoplasm of central portion of left female breast: Secondary | ICD-10-CM | POA: Diagnosis not present

## 2019-02-15 DIAGNOSIS — Z510 Encounter for antineoplastic radiation therapy: Secondary | ICD-10-CM | POA: Diagnosis not present

## 2019-02-16 ENCOUNTER — Ambulatory Visit
Admission: RE | Admit: 2019-02-16 | Discharge: 2019-02-16 | Disposition: A | Discharge status: Home / Self Care | Payer: BLUE CROSS/BLUE SHIELD | Source: Ambulatory Visit | Attending: Radiation Oncology | Admitting: Radiation Oncology

## 2019-02-16 DIAGNOSIS — Z510 Encounter for antineoplastic radiation therapy: Secondary | ICD-10-CM | POA: Diagnosis not present

## 2019-02-16 DIAGNOSIS — C50112 Malignant neoplasm of central portion of left female breast: Secondary | ICD-10-CM | POA: Diagnosis not present

## 2019-02-17 ENCOUNTER — Ambulatory Visit: Payer: BLUE CROSS/BLUE SHIELD

## 2019-02-17 ENCOUNTER — Ambulatory Visit: Admission: RE | Admit: 2019-02-17 | Payer: BLUE CROSS/BLUE SHIELD | Source: Ambulatory Visit

## 2019-02-20 ENCOUNTER — Ambulatory Visit: Payer: BLUE CROSS/BLUE SHIELD

## 2019-02-20 ENCOUNTER — Ambulatory Visit
Admission: RE | Admit: 2019-02-20 | Discharge: 2019-02-20 | Disposition: A | Discharge status: Home / Self Care | Payer: BLUE CROSS/BLUE SHIELD | Source: Ambulatory Visit | Attending: Radiation Oncology | Admitting: Radiation Oncology

## 2019-02-20 DIAGNOSIS — Z510 Encounter for antineoplastic radiation therapy: Secondary | ICD-10-CM | POA: Diagnosis not present

## 2019-02-20 DIAGNOSIS — C50112 Malignant neoplasm of central portion of left female breast: Secondary | ICD-10-CM | POA: Diagnosis not present

## 2019-02-21 ENCOUNTER — Ambulatory Visit
Admission: RE | Admit: 2019-02-21 | Discharge: 2019-02-21 | Disposition: A | Discharge status: Home / Self Care | Payer: BLUE CROSS/BLUE SHIELD | Source: Ambulatory Visit | Attending: Radiation Oncology | Admitting: Radiation Oncology

## 2019-02-21 DIAGNOSIS — Z510 Encounter for antineoplastic radiation therapy: Secondary | ICD-10-CM | POA: Diagnosis not present

## 2019-02-21 DIAGNOSIS — C50112 Malignant neoplasm of central portion of left female breast: Secondary | ICD-10-CM | POA: Diagnosis not present

## 2019-02-21 DIAGNOSIS — Z170 Estrogen receptor positive status [ER+]: Secondary | ICD-10-CM | POA: Diagnosis not present

## 2019-03-07 ENCOUNTER — Ambulatory Visit: Payer: BLUE CROSS/BLUE SHIELD

## 2019-03-07 ENCOUNTER — Other Ambulatory Visit: Payer: BLUE CROSS/BLUE SHIELD

## 2019-03-07 ENCOUNTER — Ambulatory Visit: Payer: BLUE CROSS/BLUE SHIELD | Admitting: Oncology

## 2019-03-10 ENCOUNTER — Inpatient Hospital Stay: Payer: BLUE CROSS/BLUE SHIELD

## 2019-03-10 ENCOUNTER — Inpatient Hospital Stay: Payer: BLUE CROSS/BLUE SHIELD | Attending: Oncology

## 2019-03-10 ENCOUNTER — Inpatient Hospital Stay: Payer: BLUE CROSS/BLUE SHIELD | Admitting: Oncology

## 2019-03-10 ENCOUNTER — Encounter: Payer: Self-pay | Admitting: Oncology

## 2019-03-10 ENCOUNTER — Inpatient Hospital Stay (HOSPITAL_BASED_OUTPATIENT_CLINIC_OR_DEPARTMENT_OTHER): Payer: BLUE CROSS/BLUE SHIELD | Admitting: Oncology

## 2019-03-10 ENCOUNTER — Other Ambulatory Visit: Payer: Self-pay

## 2019-03-10 VITALS — BP 126/84 | HR 76 | Temp 99.6°F | Resp 12 | Ht 62.0 in | Wt 151.9 lb

## 2019-03-10 DIAGNOSIS — C50912 Malignant neoplasm of unspecified site of left female breast: Secondary | ICD-10-CM

## 2019-03-10 DIAGNOSIS — D509 Iron deficiency anemia, unspecified: Secondary | ICD-10-CM

## 2019-03-10 DIAGNOSIS — R5383 Other fatigue: Secondary | ICD-10-CM | POA: Diagnosis not present

## 2019-03-10 DIAGNOSIS — R51 Headache: Secondary | ICD-10-CM | POA: Insufficient documentation

## 2019-03-10 DIAGNOSIS — Z79818 Long term (current) use of other agents affecting estrogen receptors and estrogen levels: Secondary | ICD-10-CM

## 2019-03-10 DIAGNOSIS — D473 Essential (hemorrhagic) thrombocythemia: Secondary | ICD-10-CM | POA: Diagnosis not present

## 2019-03-10 DIAGNOSIS — Z5181 Encounter for therapeutic drug level monitoring: Secondary | ICD-10-CM

## 2019-03-10 DIAGNOSIS — Z08 Encounter for follow-up examination after completed treatment for malignant neoplasm: Secondary | ICD-10-CM

## 2019-03-10 DIAGNOSIS — C50812 Malignant neoplasm of overlapping sites of left female breast: Secondary | ICD-10-CM

## 2019-03-10 DIAGNOSIS — N951 Menopausal and female climacteric states: Secondary | ICD-10-CM

## 2019-03-10 DIAGNOSIS — Z170 Estrogen receptor positive status [ER+]: Secondary | ICD-10-CM

## 2019-03-10 DIAGNOSIS — Z853 Personal history of malignant neoplasm of breast: Secondary | ICD-10-CM

## 2019-03-10 LAB — CBC
HCT: 38.9 % (ref 36.0–46.0)
Hemoglobin: 13.2 g/dL (ref 12.0–15.0)
MCH: 30.8 pg (ref 26.0–34.0)
MCHC: 33.9 g/dL (ref 30.0–36.0)
MCV: 90.7 fL (ref 80.0–100.0)
Platelets: 353 10*3/uL (ref 150–400)
RBC: 4.29 MIL/uL (ref 3.87–5.11)
RDW: 13.7 % (ref 11.5–15.5)
WBC: 5.3 10*3/uL (ref 4.0–10.5)
nRBC: 0 % (ref 0.0–0.2)

## 2019-03-10 LAB — VITAMIN B12: VITAMIN B 12: 356 pg/mL (ref 180–914)

## 2019-03-10 LAB — IRON AND TIBC
Iron: 47 ug/dL (ref 28–170)
Saturation Ratios: 12 % (ref 10.4–31.8)
TIBC: 380 ug/dL (ref 250–450)
UIBC: 333 ug/dL

## 2019-03-10 LAB — FERRITIN: Ferritin: 19 ng/mL (ref 11–307)

## 2019-03-10 MED ORDER — LEUPROLIDE ACETATE 3.75 MG IM KIT
3.7500 mg | PACK | INTRAMUSCULAR | Status: DC
  Administered 2019-03-10: 3.75 mg via INTRAMUSCULAR
  Filled 2019-03-10: qty 3.75

## 2019-03-10 NOTE — Progress Notes (Signed)
Patient reports, hot flashes, mood swings and headaches.

## 2019-03-15 NOTE — Progress Notes (Signed)
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 She has received Feraheme in the past last in March 2019 and her hemoglobin had improved. Patient also has thrombocytosis dating back to 2017. Jak 2 mutation testing has been negative. Bone marrow biopsy not done at this time as she is less than 72 years of age with no prior history of thrombosis. She is now seeing me for her new diagnosis of breast cancer. Patient has not had any prior abnormal mammograms or breast biopsies palpated a breast mass at the 12 o'clock position of her left breast which was followed by screening and diagnostic mammogram. It showed 1.1 cm left breast mass at the 12 o'clock position 3 cm from the nipple. Multiple benign cysts in the upper quadrant of the left breast. No axillary lymphadenopathy. This was biopsied and biopsy showed 7 mm grade 2 invasive mammary carcinoma strongly ER PR positive and HER-2/neu negative.   Final pathology showed invasive mammary carcinoma, 18 mm, grade 1. 0 out of 7 sentinel lymph nodes were positive for malignancy. pT1cpN0. Inferior margin was positive and patientunderwent reexcision surgery with negative margins and no residual carcinoma.  Oncotype testing came back with intermediate risk with a score of 20 translating to a absolute benefit of chemotherapy of about 1.6%.  OS plus AI was recommended without adjuvant chemotherapy.  Patient  is currently undergoing radiation treatment and received her first dose of Lupron in January 2020   Interval history-she does report having some mood swings, fatigue mild hot flashes as well as intermittent headaches since she started taking Lupron.  However the symptoms have been mostly mild and has not been largely impacting her quality of life significantly.  She has not had her menstrual cycle in February since starting Lupron.  ECOG PS- 0 Pain scale- 0  Review of systems- Review of Systems  Constitutional: Positive for malaise/fatigue. Negative for chills, fever and weight loss.  HENT: Negative for congestion, ear discharge and nosebleeds.   Eyes: Negative for blurred vision.  Respiratory: Negative for cough, hemoptysis, sputum production, shortness of breath and wheezing.   Cardiovascular: Negative for chest pain, palpitations, orthopnea and claudication.  Gastrointestinal: Negative for abdominal pain, blood in stool, constipation, diarrhea, heartburn, melena, nausea and vomiting.  Genitourinary: Negative for dysuria, flank pain, frequency, hematuria and urgency.  Musculoskeletal: Negative for back pain, joint pain and myalgias.  Skin: Negative for rash.  Neurological: Positive for headaches. Negative for dizziness, tingling, focal weakness, seizures and weakness.  Endo/Heme/Allergies: Does not bruise/bleed easily.       Hot flashes  Psychiatric/Behavioral: Negative for depression and suicidal ideas. The patient does not have insomnia.       No Known Allergies   Past Medical History:  Diagnosis Date  . Amblyopia of eye, right   . Anemia   . Asthma    childhood  . Cancer ([place]) 10/27/2018   left breast  . Difficult intubation 11/03/2016   DL x 2 with MAC 3 (CRNA  and MD), very anterior. DL x 2 with Glidescope #3 Lo pro, bougie utilized. Easy mask.  . Endometriosis   . GERD (gastroesophageal reflux disease)      Past Surgical History:  Procedure Laterality Date  .  ABLATION ON ENDOMETRIOSIS    . BREAST BIOPSY Left 10/28/2018   INVASIVE MAMMARY CARCINOMA  . BREAST EXCISIONAL BIOPSY Left 10/28/2018  . BREAST LUMPECTOMY Left 11/17/2018  . BREAST LUMPECTOMY Left 12/19/2018   Procedure: BREAST LUMPECTOMY;  Surgeon: Jules Husbands, MD;  Location: [facility];  Service: General;  Laterality: Left;  . BREAST LUMPECTOMY WITH NEEDLE LOCALIZATION Left 11/17/2018   Procedure: BREAST LUMPECTOMY WITH NEEDLE LOCALIZATION;  Surgeon: Jules Husbands, MD;  Location: [facility];  Service: General;  Laterality: Left;  . CLAVICLE SURGERY Left 10/2016  . COLONOSCOPY WITH PROPOFOL N/A 06/27/2018   Procedure: COLONOSCOPY WITH PROPOFOL;  Surgeon: Lucilla Lame, MD;  Location: [place];  Service: Endoscopy;  Laterality: N/A;  . ESOPHAGOGASTRODUODENOSCOPY (EGD) WITH PROPOFOL N/A 06/27/2018   Procedure: ESOPHAGOGASTRODUODENOSCOPY (EGD) WITH PROPOFOL;  Surgeon: Lucilla Lame, MD;  Location: [place];  Service: Endoscopy;  Laterality: N/A;  . EYE MUSCLE SURGERY Right    child  . ORIF CLAVICULAR FRACTURE Left 11/03/2016   Procedure: OPEN REDUCTION INTERNAL FIXATION (ORIF) CLAVICULAR FRACTURE;  Surgeon: Corky Mull, MD;  Location: [facility];  Service: Orthopedics;  Laterality: Left;  . removal of fibroid    . SENTINEL NODE BIOPSY Left 11/17/2018   Procedure: SENTINEL NODE BIOPSY;  Surgeon: Jules Husbands, MD;  Location: [facility];  Service: General;  Laterality: Left;    Social History   Socioeconomic History  . Marital status: Married    Spouse name: 0  . Number of children: 0  . Years of education: Not on file  . Highest education level: Associate degree: academic program  Occupational History  . Occupation: Librarian, academic    Comment: Actuary  Social Needs  . Financial resource strain: Not on file  . Food insecurity:    Worry: Not on file    Inability: Not on file  . Transportation needs:    Medical: Not on file    Non-medical: Not on file  Tobacco  Use  . Smoking status: Former Smoker    Packs/day: 1.00    Years: 15.00    Pack years: 15.00    Types: Cigarettes    Last attempt to quit: 02/21/2015    Years since quitting: 4.0  . Smokeless tobacco: Never Used  Substance and Sexual Activity  . Alcohol use: Yes    Alcohol/week: 2.0 standard drinks    Types: 2 Cans of beer per week    Comment: Occasional   . Drug use: No  . Sexual activity: Yes    Partners: Male    Comment: 1 female  Lifestyle  . Physical activity:    Days per week: Not on file    Minutes per session: Not on file  . Stress: Not on file  Relationships  . Social connections:    Talks on phone: Not on file    Gets together: Not on file    Attends religious service: Not on file    Active member of club or organization: Not on file    Attends meetings of clubs or organizations: Not on file    Relationship status: Not on file  . Intimate partner violence:    Fear of current or ex partner: Not on file    Emotionally abused:  Not on file    Physically abused: Not on file    Forced sexual activity: Not on file  Other Topics Concern  . 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 Lupus Mother   . Hypertension Mother   . Lymphoma Mother 64       B cell; currently 29  . Hypertension Father        currently 60  . Diabetes Father   . Breast cancer Paternal Aunt 48       deceased 44  . Lung cancer Paternal Aunt 66       deceased 14  . Brain cancer Paternal Grandfather 85       deceased 74     Current Outpatient Medications:  .  HYDROcodone-acetaminophen (NORCO/VICODIN) 5-325 MG tablet, Take 1 tablet by mouth every 6 (six) hours as needed for moderate pain., Disp: 20 tablet, Rfl: 0 .  hydrOXYzine (ATARAX/VISTARIL) 10 MG tablet, TAKE 1 TABLET BY MOUTH EVERYDAY AT  BEDTIME, Disp: 30 tablet, Rfl: 5 .  ibuprofen (ADVIL,MOTRIN) 200 MG tablet, Take 400 mg by mouth 2 (two) times daily as needed for headache or moderate pain. , Disp: , Rfl:  .  omeprazole (PRILOSEC) 20 MG capsule, Take 1 capsule (20 mg total) by mouth 2 (two) times daily before a meal., Disp: 60 capsule, Rfl: 1 .  polyethylene glycol (MIRALAX / GLYCOLAX) packet, Take 17 g by mouth daily as needed for moderate constipation., Disp: , Rfl:   Physical exam:  Vitals:   03/10/19 1323 03/10/19 1326  BP:  126/84  Pulse:  76  Resp: 12   Temp:  99.6 F (37.6 C)  TempSrc:  Tympanic  Weight: 151 lb 14.4 oz (68.9 kg)   Height: 5' 2"  (1.575 m)    Physical Exam Constitutional:      General: She is not in acute distress. HENT:     Head: Normocephalic and atraumatic.  Eyes:     Pupils: Pupils are equal, round, and reactive to light.  Neck:     Musculoskeletal: Normal range of motion.  Cardiovascular:     Rate and Rhythm: Normal rate and regular rhythm.     Heart sounds: Normal heart sounds.  Pulmonary:     Effort: Pulmonary effort is normal.     Breath sounds: Normal breath sounds.  Abdominal:     General: Bowel sounds are normal.     Palpations: Abdomen is soft.  Skin:    General: Skin is warm and dry.  Neurological:     Mental Status: She is alert and oriented to person, place, and time.      CMP Latest Ref Rng & Units 06/07/2018  Glucose 70 - 99 mg/dL 95  BUN 6 - 23 mg/dL 13  Creatinine 0.40 - 1.20 mg/dL 0.73  Sodium 135 - 145 mEq/L 139  Potassium 3.5 - 5.1 mEq/L 4.8  Chloride 96 - 112 mEq/L 104  CO2 19 - 32 mEq/L 30  Calcium 8.4 - 10.5 mg/dL 9.6  Total Protein 6.5 - 8.1 g/dL -  Total Bilirubin 0.3 - 1.2 mg/dL -  Alkaline Phos 38 - 126 U/L -  AST 15 - 41 U/L -  ALT 14 - 54 U/L -  CBC Latest Ref Rng & Units 03/10/2019  WBC 4.0 - 10.5 K/uL 5.3  Hemoglobin 12.0 - 15.0 g/dL 13.2  Hematocrit 36.0 - 46.0 % 38.9  Platelets 150 - 400 K/uL 353     Assessment and plan- Patient  is a 48 y.o. female  with stage Ia invasive mammary carcinoma of the left breast pT1 cpN0 cM0 ER PR positive HER-2/neu negative status post lumpectomy.  Oncotype score came back as intermediate risk of 20.  She completed adjuvant radiation treatment and is currently on ovarian suppression  Patient does have mild side effects from Lupron as mentioned above but is overall tolerating it well.  She did not have any menstrual cycle last month.  I will see her back in 1 month's time and at that time I will switch her to every 65-monthLupron of 1.25 mg.  I will also check her estradiol and estradiol levels at that time.  If she continues to have no menstrual cycles I will add Arimidex in 1 month's time   Visit Diagnosis 1. Malignant neoplasm of left female breast, unspecified estrogen receptor status, unspecified site of breast ([facility]   2. Encounter for monitoring Lupron therapy      Dr. ARanda Evens MD, MPH [id number]/18/2020 8:31 AM

## 2019-03-17 ENCOUNTER — Encounter: Payer: Self-pay | Admitting: *Deleted

## 2019-03-31 ENCOUNTER — Ambulatory Visit: Payer: BLUE CROSS/BLUE SHIELD | Admitting: Radiation Oncology

## 2019-04-03 ENCOUNTER — Ambulatory Visit: Payer: BLUE CROSS/BLUE SHIELD | Admitting: Oncology

## 2019-04-03 ENCOUNTER — Encounter: Payer: Self-pay | Admitting: Oncology

## 2019-04-03 ENCOUNTER — Other Ambulatory Visit: Payer: BLUE CROSS/BLUE SHIELD

## 2019-04-06 ENCOUNTER — Encounter: Payer: Self-pay | Admitting: *Deleted

## 2019-04-06 NOTE — Progress Notes (Signed)
Called patient to follow up post radiation therapy.  States she is doing well.  States she has a televisit with Dr. Janese Banks next week.  States she does not want to continue her Lupron injections and will discuss with Dr. Janese Banks next week.  No needs at this time.

## 2019-04-07 ENCOUNTER — Other Ambulatory Visit: Payer: Self-pay | Admitting: Family Medicine

## 2019-04-10 ENCOUNTER — Inpatient Hospital Stay: Payer: BLUE CROSS/BLUE SHIELD | Attending: Oncology | Admitting: Oncology

## 2019-04-10 ENCOUNTER — Encounter: Payer: Self-pay | Admitting: Oncology

## 2019-04-10 ENCOUNTER — Ambulatory Visit: Payer: BLUE CROSS/BLUE SHIELD | Admitting: Oncology

## 2019-04-10 ENCOUNTER — Ambulatory Visit
Admission: RE | Admit: 2019-04-10 | Discharge: 2019-04-10 | Disposition: A | Discharge status: Home / Self Care | Payer: BLUE CROSS/BLUE SHIELD | Source: Ambulatory Visit | Attending: Radiation Oncology | Admitting: Radiation Oncology

## 2019-04-10 ENCOUNTER — Inpatient Hospital Stay: Payer: BLUE CROSS/BLUE SHIELD

## 2019-04-10 ENCOUNTER — Encounter: Payer: Self-pay | Admitting: Radiation Oncology

## 2019-04-10 ENCOUNTER — Other Ambulatory Visit: Payer: Self-pay

## 2019-04-10 ENCOUNTER — Other Ambulatory Visit: Payer: BLUE CROSS/BLUE SHIELD

## 2019-04-10 VITALS — BP 131/83 | HR 68 | Temp 98.9°F | Resp 18 | Wt 156.7 lb

## 2019-04-10 DIAGNOSIS — C7981 Secondary malignant neoplasm of breast: Secondary | ICD-10-CM | POA: Insufficient documentation

## 2019-04-10 DIAGNOSIS — Z79810 Long term (current) use of selective estrogen receptor modulators (SERMs): Secondary | ICD-10-CM | POA: Diagnosis not present

## 2019-04-10 DIAGNOSIS — Z7189 Other specified counseling: Secondary | ICD-10-CM

## 2019-04-10 DIAGNOSIS — Z923 Personal history of irradiation: Secondary | ICD-10-CM | POA: Diagnosis not present

## 2019-04-10 DIAGNOSIS — Z79899 Other long term (current) drug therapy: Secondary | ICD-10-CM

## 2019-04-10 DIAGNOSIS — C50912 Malignant neoplasm of unspecified site of left female breast: Secondary | ICD-10-CM

## 2019-04-10 DIAGNOSIS — Z170 Estrogen receptor positive status [ER+]: Secondary | ICD-10-CM | POA: Diagnosis not present

## 2019-04-10 DIAGNOSIS — D509 Iron deficiency anemia, unspecified: Secondary | ICD-10-CM

## 2019-04-10 DIAGNOSIS — C50112 Malignant neoplasm of central portion of left female breast: Secondary | ICD-10-CM

## 2019-04-10 DIAGNOSIS — Z87891 Personal history of nicotine dependence: Secondary | ICD-10-CM

## 2019-04-10 MED ORDER — TAMOXIFEN CITRATE 20 MG PO TABS: 20.0000 mg | ORAL_TABLET | Freq: Every day | ORAL | 3 refills | Status: DC

## 2019-04-10 NOTE — Progress Notes (Signed)
Radiation Oncology Follow up Note  Name: Melinda Holder   Date:   04/10/2019 MRN:  [id number] DOB: 1971-04-19    This 48 y.o. female presents to the clinic today for 1 month follow-up status post whole breast radiation to her left breast for stage I ER PR positive invasive mammary carcinoma  REFERRING PROVIDER: Leone Haven, MD  HPI: Patient is a 48 year old female now at 1 month having completed whole breast radiation to her left breast for stage I ER PR positive invasive mammary carcinoma.  Seen today in routine follow-up she is doing well.  She specifically denies breast tenderness cough or bone pain.  She has been on Lupron although is discontinuing that based on the side effect profile including extreme headaches..  COMPLICATIONS OF TREATMENT: none  FOLLOW UP COMPLIANCE: keeps appointments   PHYSICAL EXAM:  BP 131/83   Pulse 68   Temp 98.9 F (37.2 C)   Resp 18   Wt 156 lb 12 oz (71.1 kg)   BMI 28.67 kg/m  Lungs are clear to A&P cardiac examination essentially unremarkable with regular rate and rhythm. No dominant mass or nodularity is noted in either breast in 2 positions examined. Incision is well-healed. No axillary or supraclavicular adenopathy is appreciated. Cosmetic result is excellent.  Well-developed well-nourished patient in NAD. HEENT reveals PERLA, EOMI, discs not visualized.  Oral cavity is clear. No oral mucosal lesions are identified. Neck is clear without evidence of cervical or supraclavicular adenopathy. Lungs are clear to A&P. Cardiac examination is essentially unremarkable with regular rate and rhythm without murmur rub or thrill. Abdomen is benign with no organomegaly or masses noted. Motor sensory and DTR levels are equal and symmetric in the upper and lower extremities. Cranial nerves II through XII are grossly intact. Proprioception is intact. No peripheral adenopathy or edema is identified. No motor or sensory levels are noted. Crude visual fields are  within normal range.  RADIOLOGY RESULTS: No current films for review  PLAN: Present time patient is doing well 1 month out from whole breast radiation.  I am pleased with her overall progress.  She will see Dr. Janese Banks and discuss antiestrogen therapy later this week.  I will have asked to see her back in 4 to 5 months for follow-up.  Patient is to call at anytime with any concerns.  I would like to take this opportunity to thank you for allowing me to participate in the care of your patient.Noreene Filbert, MD

## 2019-04-10 NOTE — Telephone Encounter (Signed)
Sent to PCP for approval for 90 day aupply   Last OV 06/07/2018   Last refilled 02/10/2019 disp 30 with 5 refills   Next OV none scheduled

## 2019-04-10 NOTE — Progress Notes (Signed)
Pt states that since she has been on the medicine she is having 4-5 HA a day and takes tylenol or ibuprofen for it. She does have hot flashes  During the day but worse and mostly at night.

## 2019-04-10 NOTE — Telephone Encounter (Signed)
Please contact the patient and see what she takes this for. She also needs to schedule follow-up as it has almost been a year since she has been seen. Thanks.

## 2019-04-11 NOTE — Telephone Encounter (Signed)
Called and spoke with pt. Pt takes this medication due to itching and she had an appt scheduled but had to canalled it due to her cancer treatments.   Has has rescheduled an appt for 08/11/2019 @ 9:30 Am

## 2019-04-12 NOTE — Progress Notes (Signed)
I connected with Faustino Congress on 04/12/19 at 10:00 AM EDT by video enabled telemedicine visit and verified that I am speaking with the correct person using two identifiers.   I discussed the limitations, risks, security and privacy concerns of performing an evaluation and management service by telemedicine and the availability of in-person appointments. I also discussed with the patient that there may be a patient responsible charge related to this service. The patient expressed understanding and agreed to proceed.  Other persons participating in the visit and their role in the encounter:  none  Patient's location:  home Provider's location:  home  Chief Complaint:  Discuss tolerance to ovarian suppression  History of present illness: Patient is a 48 year old premenopausal woman who sees me for her iron deficiency anemia. She has received Feraheme in the past last in March 2019 and her hemoglobin had improved. Patient also has thrombocytosis dating back to 2017. Jak 2 mutation testing has been negative. Bone marrow biopsy not done at this time as she is less than 36 years of age with no prior history of thrombosis. She is now seeing me for her new diagnosis of breast cancer. Patient has not had any prior abnormal mammograms or breast biopsies palpated a breast mass at the 12 o'clock position of her left breast which was followed by screening and diagnostic mammogram. It showed 1.1 cm left breast mass at the 12 o'clock position 3 cm from the nipple. Multiple benign cysts in the upper quadrant of the left breast. No axillary lymphadenopathy. This was biopsied and biopsy showed 7 mm grade 2 invasive mammary carcinoma strongly ER PR positive and HER-2/neu negative.   Final pathology showed invasive mammary carcinoma, 18 mm, grade 1. 0 out of 7 sentinel lymph nodes were positive for malignancy. pT1cpN0. Inferior margin was positive and patientunderwent reexcision surgery with negative  margins and no residual carcinoma.Oncotype testing came back with intermediate risk with a score of 20 translating to a absolute benefit of chemotherapy of about 1.6%. OS plus AI was recommended without adjuvant chemotherapy. Patient  received her first dose of Lupron in January 2020.  She completed adjuvant radiation treatment   Interval history patient is received 3 doses of Lupron so far last dose was given in March 2020.  She is also completed adjuvant radiation treatment.  Patient reports having significant hot flashes as well as continuous headaches throughout the day since she started taking Lupron.  She has been trying Tylenol 3-4 times a day for her headaches which has only been helping transiently.  Her hot flashes make it difficult for her to sleep.  She has not had menstrual cycles on Lupron   Review of Systems  Constitutional: Negative for chills, fever, malaise/fatigue and weight loss.  HENT: Negative for congestion, ear discharge and nosebleeds.   Eyes: Negative for blurred vision.  Respiratory: Negative for cough, hemoptysis, sputum production, shortness of breath and wheezing.   Cardiovascular: Negative for chest pain, palpitations, orthopnea and claudication.  Gastrointestinal: Negative for abdominal pain, blood in stool, constipation, diarrhea, heartburn, melena, nausea and vomiting.  Genitourinary: Negative for dysuria, flank pain, frequency, hematuria and urgency.  Musculoskeletal: Negative for back pain, joint pain and myalgias.  Skin: Negative for rash.  Neurological: Positive for headaches. Negative for dizziness, tingling, focal weakness, seizures and weakness.  Endo/Heme/Allergies: Does not bruise/bleed easily.       Hot flashes  Psychiatric/Behavioral: Negative for depression and suicidal ideas. The patient does not have insomnia.     No Known  Allergies  Past Medical History:  Diagnosis Date  . Amblyopia of eye, right   . Anemia   . Asthma    childhood  .  Cancer ([place]) 10/27/2018   left breast  . Difficult intubation 11/03/2016   DL x 2 with MAC 3 (CRNA and MD), very anterior. DL x 2 with Glidescope #3 Lo pro, bougie utilized. Easy mask.  . Endometriosis   . GERD (gastroesophageal reflux disease)     Past Surgical History:  Procedure Laterality Date  . ABLATION ON ENDOMETRIOSIS    . BREAST BIOPSY Left 10/28/2018   INVASIVE MAMMARY CARCINOMA  . BREAST EXCISIONAL BIOPSY Left 10/28/2018  . BREAST LUMPECTOMY Left 11/17/2018  . BREAST LUMPECTOMY Left 12/19/2018   Procedure: BREAST LUMPECTOMY;  Surgeon: Jules Husbands, MD;  Location: [facility];  Service: General;  Laterality: Left;  . BREAST LUMPECTOMY WITH NEEDLE LOCALIZATION Left 11/17/2018   Procedure: BREAST LUMPECTOMY WITH NEEDLE LOCALIZATION;  Surgeon: Jules Husbands, MD;  Location: [facility];  Service: General;  Laterality: Left;  . CLAVICLE SURGERY Left 10/2016  . COLONOSCOPY WITH PROPOFOL N/A 06/27/2018   Procedure: COLONOSCOPY WITH PROPOFOL;  Surgeon: Lucilla Lame, MD;  Location: [place];  Service: Endoscopy;  Laterality: N/A;  . ESOPHAGOGASTRODUODENOSCOPY (EGD) WITH PROPOFOL N/A 06/27/2018   Procedure: ESOPHAGOGASTRODUODENOSCOPY (EGD) WITH PROPOFOL;  Surgeon: Lucilla Lame, MD;  Location: [place];  Service: Endoscopy;  Laterality: N/A;  . EYE MUSCLE SURGERY Right    child  . ORIF CLAVICULAR FRACTURE Left 11/03/2016   Procedure: OPEN REDUCTION INTERNAL FIXATION (ORIF) CLAVICULAR FRACTURE;  Surgeon: Corky Mull, MD;  Location: [facility];  Service: Orthopedics;  Laterality: Left;  . removal of fibroid    . SENTINEL NODE BIOPSY Left 11/17/2018   Procedure: SENTINEL NODE BIOPSY;  Surgeon: Jules Husbands, MD;  Location: [facility];  Service: General;  Laterality: Left;    Social History   Socioeconomic History  . Marital status: Married    Spouse name: 0  . Number of children: 0  . Years of education: Not on file  . Highest education level: Associate degree:  academic program  Occupational History  . Occupation: Librarian, academic    Comment: Actuary  Social Needs  . Financial resource strain: Not on file  . Food insecurity:    Worry: Not on file    Inability: Not on file  . Transportation needs:    Medical: Not on file    Non-medical: Not on file  Tobacco Use  . Smoking status: Former Smoker    Packs/day: 1.00    Years: 15.00    Pack years: 15.00    Types: Cigarettes    Last attempt to quit: 02/21/2015    Years since quitting: 4.1  . Smokeless tobacco: Never Used  Substance and Sexual Activity  . Alcohol use: Yes    Alcohol/week: 2.0 standard drinks    Types: 2 Cans of beer per week    Comment: Occasional   . Drug use: No  . Sexual activity: Yes    Partners: Male    Comment: 1 female  Lifestyle  . Physical activity:    Days per week: Not on file    Minutes per session: Not on file  . Stress: Not on file  Relationships  . Social connections:    Talks on phone: Not on file    Gets together: Not on file    Attends religious service: Not on file    Active member of  club or organization: Not on file    Attends meetings of clubs or organizations: Not on file    Relationship status: Not on file  . Intimate partner violence:    Fear of current or ex partner: Not on file    Emotionally abused: Not on file    Physically abused: Not on file    Forced sexual activity: Not on file  Other Topics Concern  . 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 Lupus Mother   . Hypertension Mother   . Lymphoma Mother 38       B cell; currently 37  . Hypertension Father        currently 13  . Diabetes Father   . Breast cancer Paternal Aunt 80       deceased 6  . Lung cancer Paternal Aunt 47       deceased 22  . Brain cancer Paternal  Grandfather 38       deceased 61     Current Outpatient Medications:  .  ibuprofen (ADVIL,MOTRIN) 200 MG tablet, Take 400 mg by mouth 2 (two) times daily as needed for headache or moderate pain. , Disp: , Rfl:  .  omeprazole (PRILOSEC) 20 MG capsule, Take 1 capsule (20 mg total) by mouth 2 (two) times daily before a meal. (Patient taking differently: Take 20 mg by mouth 2 (two) times daily as needed. ), Disp: 60 capsule, Rfl: 1 .  polyethylene glycol (MIRALAX / GLYCOLAX) packet, Take 17 g by mouth daily as needed for moderate constipation., Disp: , Rfl:  .  HYDROcodone-acetaminophen (NORCO/VICODIN) 5-325 MG tablet, Take 1 tablet by mouth every 6 (six) hours as needed for moderate pain. (Patient not taking: Reported on 04/10/2019), Disp: 20 tablet, Rfl: 0 .  hydrOXYzine (ATARAX/VISTARIL) 10 MG tablet, TAKE 1 TABLET BY MOUTH EVERYDAY AT BEDTIME, Disp: 90 tablet, Rfl: 2 .  tamoxifen (NOLVADEX) 20 MG tablet, Take 1 tablet (20 mg total) by mouth daily., Disp: 90 tablet, Rfl: 3  No results found.  No images are attached to the encounter.   CMP Latest Ref Rng & Units 06/07/2018  Glucose 70 - 99 mg/dL 95  BUN 6 - 23 mg/dL 13  Creatinine 0.40 - 1.20 mg/dL 0.73  Sodium 135 - 145 mEq/L 139  Potassium 3.5 - 5.1 mEq/L 4.8  Chloride 96 - 112 mEq/L 104  CO2 19 - 32 mEq/L 30  Calcium 8.4 - 10.5 mg/dL 9.6  Total Protein 6.5 - 8.1 g/dL -  Total Bilirubin 0.3 - 1.2 mg/dL -  Alkaline Phos 38 - 126 U/L -  AST 15 - 41 U/L -  ALT 14 - 54 U/L -   CBC Latest Ref Rng & Units 03/10/2019  WBC 4.0 - 10.5 K/uL 5.3  Hemoglobin 12.0 - 15.0 g/dL 13.2  Hematocrit 36.0 - 46.0 % 38.9  Platelets 150 - 400 K/uL 353     Observation/objective: Appears comfortable over her video visit today.  Breathing is nonlabored.  No acute distress  Assessment and plan:with stage Ia invasive mammary carcinoma of the left breast pT1 cpN0 cM0 ER PR positive HER-2/neu negative status post lumpectomy.Oncotype score came back as  intermediate risk of 20.  She completed  adjuvant radiation treatment she has received 3 doses of Lupron so far  Patient has not been able to tolerate her Lupron due to significant headaches as well as hot flashes.  Tylenol has not been helping with her headaches.  Patient does not wish to continue Lupron at this time.  Since she is premenopausal-without Lupron she will not be starting Arimidex at this time.    I will switch her to tamoxifen instead 20 mg daily which she will take once a day for 10 years.  Prescription will be sent to pharmacy today.  Discussed risks and benefits of tamoxifen including all but not limited to fatigue, hot flashes, mood swings, increased risk of DVT and uterine cancer.  Patient understands and agrees to proceed as planned.  Treatment will be given with a curative intent.    Follow-up instructions:I will see her back in 6 weeks time via video visit to see how she is tolerating her tamoxifen.  I discussed the assessment and treatment plan with the patient. The patient was provided an opportunity to ask questions and all were answered. The patient agreed with the plan and demonstrated an understanding of the instructions.   The patient was advised to call back or seek an in-person evaluation if the symptoms worsen or if the condition fails to improve as anticipated.    Visit Diagnosis: 1. Malignant neoplasm of left breast in female, estrogen receptor positive, unspecified site of breast ([place])   2. Goals of care, counseling/discussion     Dr. Randa Evens, MD, MPH [facility] at [facility] Pager- [phone] 04/12/2019 9:49 AM

## 2019-04-13 ENCOUNTER — Encounter: Payer: Self-pay | Admitting: *Deleted

## 2019-04-17 ENCOUNTER — Encounter: Payer: Self-pay | Admitting: Radiation Oncology

## 2019-04-20 ENCOUNTER — Encounter: Payer: Self-pay | Admitting: Oncology

## 2019-06-02 ENCOUNTER — Encounter: Payer: Self-pay | Admitting: Family Medicine

## 2019-06-02 ENCOUNTER — Other Ambulatory Visit: Payer: Self-pay

## 2019-06-02 ENCOUNTER — Ambulatory Visit (INDEPENDENT_AMBULATORY_CARE_PROVIDER_SITE_OTHER): Payer: BC Managed Care – PPO | Admitting: Family Medicine

## 2019-06-02 ENCOUNTER — Telehealth: Payer: Self-pay | Admitting: *Deleted

## 2019-06-02 VITALS — BP 124/68 | HR 91 | Temp 98.5°F | Resp 16 | Ht 62.0 in | Wt 158.0 lb

## 2019-06-02 DIAGNOSIS — L02224 Furuncle of groin: Secondary | ICD-10-CM

## 2019-06-02 MED ORDER — SULFAMETHOXAZOLE-TRIMETHOPRIM 800-160 MG PO TABS: 1.0000 | ORAL_TABLET | Freq: Two times a day (BID) | ORAL | 0 refills | Status: DC

## 2019-06-02 MED ORDER — MUPIROCIN 2 % EX OINT: 1.0000 "application " | TOPICAL_OINTMENT | Freq: Two times a day (BID) | CUTANEOUS | 0 refills | Status: DC

## 2019-06-02 NOTE — Telephone Encounter (Signed)
Scheduled with Boil on in side left leg getting worse today.

## 2019-06-02 NOTE — Patient Instructions (Signed)

## 2019-06-02 NOTE — Telephone Encounter (Signed)
Copied from [place] [id number]. Topic: General - Other >> Jun 02, 2019  7:57 AM Leward Quan A wrote: Reason for CRM: Patient would like to schedule an appointment for today preferably asking for a call back please. Ph# [phone]

## 2019-06-02 NOTE — Progress Notes (Signed)
Subjective:    Patient ID: Melinda Holder, female    DOB: 1971/06/06, 48 y.o.   MRN: [id number]  HPI   Patient presents to clinic due to a boil area on left groin.  Patient states she will get boils in this area on occasion.  Has not had one in at least a few years.   States area has not opened or begun to drain.  It is a little tender to touch.  No fever or chills.   Patient Active Problem List   Diagnosis Date Noted  . Goals of care, counseling/discussion 01/05/2019  . 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[place])   . Malignant neoplasm of left female breast ([place]) 11/03/2018  . Acute gastritis without hemorrhage   . Acute duodenitis   . Other specified diseases of intestine   . Elevated glucose 06/07/2018  . Enteritis 06/30/2017  . Malrotation of intestine   . Ankle swelling, right 05/20/2017  . Iron deficiency anemia 05/20/2017  . Constipation 05/20/2017  . Encounter for general adult medical examination with abnormal findings 02/27/2016  . Pruritus 09/03/2015  . Overweight (BMI 25.0-29.9) 09/03/2015  . Adnexal pain 11/18/2012   Social History   Tobacco Use  . Smoking status: Former Smoker    Packs/day: 1.00    Years: 15.00    Pack years: 15.00    Types: Cigarettes    Last attempt to quit: 02/21/2015    Years since quitting: 4.2  . Smokeless tobacco: Never Used  Substance Use Topics  . Alcohol use: Yes    Alcohol/week: 2.0 standard drinks    Types: 2 Cans of beer per week    Comment: Occasional    Review of Systems  Constitutional: Negative for chills, fatigue and fever.  HENT: Negative for congestion, ear pain, sinus pain and sore throat.   Eyes: Negative.   Respiratory: Negative for cough, shortness of breath and wheezing.   Cardiovascular: Negative for chest pain, palpitations and leg swelling.  Gastrointestinal: Negative for abdominal pain, diarrhea, nausea and vomiting.  Genitourinary: Negative for dysuria, frequency and urgency.   Musculoskeletal: Negative for arthralgias and myalgias.  Skin: +boil on skin; left groin area  Neurological: Negative for syncope, light-headedness and headaches.  Psychiatric/Behavioral: The patient is not nervous/anxious.       Objective:   Physical Exam Vitals signs and nursing note reviewed.  Constitutional:      General: She is not in acute distress.    Appearance: She is not toxic-appearing.  HENT:     Head: Normocephalic and atraumatic.  Eyes:     General: No scleral icterus.    Extraocular Movements: Extraocular movements intact.     Conjunctiva/sclera: Conjunctivae normal.  Cardiovascular:     Rate and Rhythm: Normal rate and regular rhythm.     Heart sounds: Normal heart sounds.  Pulmonary:     Effort: Pulmonary effort is normal. No respiratory distress.     Breath sounds: Normal breath sounds.  Skin:    General: Skin is warm and dry.          Comments: +boil area in left groin. Location represented by red mark on diagram. About size of a marble, red raised and tender. It is not open or draining at this time.   Neurological:     Mental Status: She is alert and oriented to person, place, and time.     Gait: Gait normal.  Psychiatric:        Mood and Affect:  Mood normal.        Behavior: Behavior normal.    Today's Vitals   06/02/19 0846  BP: 124/68  Pulse: 91  Resp: 16  Temp: 98.5 F (36.9 C)  TempSrc: Oral  SpO2: 96%  Weight: 158 lb (71.7 kg)  Height: 5\' 2"  (1.575 m)   Body mass index is 28.9 kg/m.     Assessment & Plan:    Boil left groin- we will treat with Bactrim twice daily for 10 days.  She will also put topical mupirocin ointment on boil area to help reduce redness, tenderness and infection.  Patient advised that the area may open and drain on its own or it may just resolve without opening and draining.  Due to location of boil I prefer not to perform an incision and drainage procedure because it will be very difficult to keep a dressing on  the area and I fear cutting it open could make infection worse.  Patient advised that if area gets larger improves with medication to call us right away.  Also advised that area does recur in the next couple of months, at that point we may refer to dermatology or surgery for evaluation  Patient will otherwise keep regularly scheduled follow-up with PCP as planned and she is aware she can call office anytime with questions or concerns.

## 2019-06-02 NOTE — Telephone Encounter (Signed)
Copied from [place] [phone]. Topic: General - Other >> Jun 02, 2019  7:58 AM Carolyn Stare wrote:  Pt req an appt for a boil inside left leg Scheduled NP.

## 2019-06-05 ENCOUNTER — Encounter: Payer: Self-pay | Admitting: Oncology

## 2019-06-16 ENCOUNTER — Encounter: Payer: Self-pay | Admitting: Radiation Oncology

## 2019-06-19 ENCOUNTER — Encounter: Payer: Self-pay | Admitting: Oncology

## 2019-06-21 ENCOUNTER — Other Ambulatory Visit: Payer: Self-pay

## 2019-06-21 ENCOUNTER — Encounter: Payer: Self-pay | Admitting: Oncology

## 2019-06-21 ENCOUNTER — Inpatient Hospital Stay: Payer: BC Managed Care – PPO | Attending: Oncology | Admitting: Oncology

## 2019-06-21 ENCOUNTER — Other Ambulatory Visit: Payer: Self-pay | Admitting: *Deleted

## 2019-06-21 DIAGNOSIS — D72829 Elevated white blood cell count, unspecified: Secondary | ICD-10-CM

## 2019-06-21 DIAGNOSIS — C50912 Malignant neoplasm of unspecified site of left female breast: Secondary | ICD-10-CM | POA: Diagnosis not present

## 2019-06-21 DIAGNOSIS — Z79899 Other long term (current) drug therapy: Secondary | ICD-10-CM

## 2019-06-21 DIAGNOSIS — Z08 Encounter for follow-up examination after completed treatment for malignant neoplasm: Secondary | ICD-10-CM

## 2019-06-21 DIAGNOSIS — Z79810 Long term (current) use of selective estrogen receptor modulators (SERMs): Secondary | ICD-10-CM

## 2019-06-21 DIAGNOSIS — D509 Iron deficiency anemia, unspecified: Secondary | ICD-10-CM

## 2019-06-21 DIAGNOSIS — D508 Other iron deficiency anemias: Secondary | ICD-10-CM

## 2019-06-21 DIAGNOSIS — Z170 Estrogen receptor positive status [ER+]: Secondary | ICD-10-CM

## 2019-06-21 DIAGNOSIS — Z923 Personal history of irradiation: Secondary | ICD-10-CM

## 2019-06-21 DIAGNOSIS — Z5181 Encounter for therapeutic drug level monitoring: Secondary | ICD-10-CM

## 2019-06-21 DIAGNOSIS — Z853 Personal history of malignant neoplasm of breast: Secondary | ICD-10-CM

## 2019-06-21 DIAGNOSIS — Z87891 Personal history of nicotine dependence: Secondary | ICD-10-CM

## 2019-06-21 MED ORDER — TAMOXIFEN CITRATE 10 MG PO TABS: 10.0000 mg | ORAL_TABLET | Freq: Every day | ORAL | 3 refills | Status: DC

## 2019-06-21 NOTE — Progress Notes (Signed)
Pt headaches are gone and hot flashes are so much better being off tamoxifen.

## 2019-06-22 NOTE — Progress Notes (Signed)
I connected with Melinda Holder on 06/22/19 at  1:15 PM EDT by video enabled telemedicine visit and verified that I am speaking with the correct person using two identifiers.   I discussed the limitations, risks, security and privacy concerns of performing an evaluation and management service by telemedicine and the availability of in-person appointments. I also discussed with the patient that there may be a patient responsible charge related to this service. The patient expressed understanding and agreed to proceed.  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 She has received Feraheme in the past last in March 2019 and her hemoglobin had improved. Patient also has thrombocytosis dating back to 2017. Jak 2 mutation testing has been negative. Bone marrow biopsy not done at this time as she is less than 38 years of age with no prior history of thrombosis. She is now seeing me for her new diagnosis of breast cancer. Patient has not had any prior abnormal mammograms or breast biopsies palpated a breast mass at the 12 o'clock position of her left breast which was followed by screening and diagnostic mammogram. It showed 1.1 cm left breast mass at the 12 o'clock position 3 cm from the nipple. Multiple benign cysts in the upper quadrant of the left breast. No axillary lymphadenopathy. This was biopsied and biopsy showed 7 mm grade 2 invasive mammary carcinoma strongly ER PR positive and HER-2/neu negative.   Final pathology showed invasive mammary carcinoma, 18 mm, grade 1. 0 out of 7 sentinel  lymph nodes were positive for malignancy. pT1cpN0. Inferior margin was positive and patientunderwent reexcision surgery with negative margins and no residual carcinoma.Oncotype testing came back with intermediate risk with a score of 20 translating to a absolute benefit of chemotherapy of about 1.6%. OS plus AI was recommended without adjuvant chemotherapy. Patient  received her first dose of Lupron in January 2020.  She completed adjuvant radiation treatment Patient received 3 doses of Lupron but had significant headaches and hot flashes that was affecting her quality of life and patient chose not to continue with Lupron.  Because she was premenopausal at diagnosis she was started on tamoxifen 20 mg daily  Interval history: Patient has not had any menstrual cycles for the last 5 months.  Her last menstrual cycle was in January after she received her first dose of Lupron.  Patient was on tamoxifen 20 mg daily but says she could not tolerate it due to hot flashes and headaches and she stopped taking it a couple of weeks ago.  Since then she reports that her symptoms have been improving.   Review of Systems  Constitutional: Positive for malaise/fatigue. Negative for chills, fever and weight loss.  HENT: Negative for congestion, ear discharge and nosebleeds.   Eyes: Negative for blurred vision.  Respiratory: Negative for cough, hemoptysis, sputum production, shortness of breath and wheezing.   Cardiovascular: Negative for chest pain, palpitations, orthopnea and claudication.  Gastrointestinal: Negative for abdominal pain, blood in stool, constipation, diarrhea, heartburn, melena, nausea and vomiting.  Genitourinary: Negative for dysuria, flank pain, frequency, hematuria and urgency.  Musculoskeletal: Negative for back pain, joint  pain and myalgias.  Skin: Negative for rash.  Neurological: Positive for headaches. Negative for dizziness, tingling, focal weakness, seizures and weakness.   Endo/Heme/Allergies: Does not bruise/bleed easily.       Hot flashes  Psychiatric/Behavioral: Negative for depression and suicidal ideas. The patient does not have insomnia.     No Known Allergies  Past Medical History:  Diagnosis Date  . Amblyopia of eye, right   . Anemia   . Asthma    childhood  . Cancer ([place]) 10/27/2018   left breast  . Difficult intubation 11/03/2016   DL x 2 with MAC 3 (CRNA and MD), very anterior. DL x 2 with Glidescope #3 Lo pro, bougie utilized. Easy mask.  . Endometriosis   . GERD (gastroesophageal reflux disease)     Past Surgical History:  Procedure Laterality Date  . ABLATION ON ENDOMETRIOSIS    . BREAST BIOPSY Left 10/28/2018   INVASIVE MAMMARY CARCINOMA  . BREAST EXCISIONAL BIOPSY Left 10/28/2018  . BREAST LUMPECTOMY Left 11/17/2018  . BREAST LUMPECTOMY Left 12/19/2018   Procedure: BREAST LUMPECTOMY;  Surgeon: Jules Husbands, MD;  Location: [facility];  Service: General;  Laterality: Left;  . BREAST LUMPECTOMY WITH NEEDLE LOCALIZATION Left 11/17/2018   Procedure: BREAST LUMPECTOMY WITH NEEDLE LOCALIZATION;  Surgeon: Jules Husbands, MD;  Location: [facility];  Service: General;  Laterality: Left;  . CLAVICLE SURGERY Left 10/2016  . COLONOSCOPY WITH PROPOFOL N/A 06/27/2018   Procedure: COLONOSCOPY WITH PROPOFOL;  Surgeon: Lucilla Lame, MD;  Location: [place];  Service: Endoscopy;  Laterality: N/A;  . ESOPHAGOGASTRODUODENOSCOPY (EGD) WITH PROPOFOL N/A 06/27/2018   Procedure: ESOPHAGOGASTRODUODENOSCOPY (EGD) WITH PROPOFOL;  Surgeon: Lucilla Lame, MD;  Location: [facility];  Service: Endoscopy;  Laterality: N/A;  . EYE MUSCLE SURGERY Right    child  . ORIF CLAVICULAR FRACTURE Left 11/03/2016   Procedure: OPEN REDUCTION INTERNAL FIXATION (ORIF) CLAVICULAR FRACTURE;  Surgeon: Corky Mull, MD;  Location: [facility];  Service: Orthopedics;  Laterality: Left;  . removal of fibroid    . SENTINEL NODE BIOPSY Left 11/17/2018   Procedure:  SENTINEL NODE BIOPSY;  Surgeon: Jules Husbands, MD;  Location: [facility];  Service: General;  Laterality: Left;    Social History   Socioeconomic History  . Marital status: Married    Spouse name: 0  . Number of children: 0  . Years of education: Not on file  . Highest education level: Associate degree: academic program  Occupational History  . Occupation: Librarian, academic    Comment: Actuary  Social Needs  . Financial resource strain: Not on file  . Food insecurity    Worry: Not on file    Inability: Not on file  . Transportation needs    Medical: Not on file    Non-medical: Not on file  Tobacco Use  . Smoking status: Former Smoker    Packs/day: 1.00    Years: 15.00    Pack years: 15.00    Types: Cigarettes    Quit date: 02/21/2015    Years since quitting: 4.3  . Smokeless tobacco: Never Used  Substance and Sexual Activity  . Alcohol use: Yes    Alcohol/week: 2.0 standard drinks    Types: 2 Cans of beer per week    Comment: Occasional   . Drug use: No  . Sexual activity: Yes    Partners: Male    Comment: 1 female  Lifestyle  . Physical activity    Days per week: Not on  file    Minutes per session: Not on file  . Stress: Not on file  Relationships  . Social Herbalist on phone: Not on file    Gets together: Not on file    Attends religious service: Not on file    Active member of club or organization: Not on file    Attends meetings of clubs or organizations: Not on file    Relationship status: Not on file  . Intimate partner violence    Fear of current or ex partner: Not on file    Emotionally abused: Not on file    Physically abused: Not on file    Forced sexual activity: Not on file  Other Topics Concern  . 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 Lupus Mother   . Hypertension Mother   . Lymphoma Mother 22       B cell; currently 68  . Hypertension Father        currently 60  . Diabetes Father   . Breast cancer Paternal Aunt 36       deceased 42  . Lung cancer Paternal Aunt 67       deceased 53  . Brain cancer Paternal Grandfather 76       deceased 57     Current Outpatient Medications:  .  HYDROcodone-acetaminophen (NORCO/VICODIN) 5-325 MG tablet, Take 1 tablet by mouth every 6 (six) hours as needed for moderate pain., Disp: 20 tablet, Rfl: 0 .  hydrOXYzine (ATARAX/VISTARIL) 10 MG tablet, TAKE 1 TABLET BY MOUTH EVERYDAY AT BEDTIME, Disp: 90 tablet, Rfl: 2 .  ibuprofen (ADVIL,MOTRIN) 200 MG tablet, Take 400 mg by mouth 2 (two) times daily as needed for headache or moderate pain. , Disp: , Rfl:  .  mupirocin ointment (BACTROBAN) 2 %, Apply 1 application topically 2 (two) times daily., Disp: 22 g, Rfl: 0 .  omeprazole (PRILOSEC) 20 MG capsule, Take 1 capsule (20 mg total) by mouth 2 (two) times daily before a meal. (Patient taking differently: Take 20 mg by mouth 2 (two) times daily as needed. ), Disp: 60 capsule, Rfl: 1 .  polyethylene glycol (MIRALAX / GLYCOLAX) packet, Take 17 g by mouth daily as needed for moderate constipation., Disp: , Rfl:  .  tamoxifen (NOLVADEX) 10 MG tablet, Take 1 tablet (10 mg total) by mouth daily., Disp: 30 tablet, Rfl: 3  No results found.  No images are attached to the encounter.   CMP Latest Ref Rng & Units 06/07/2018  Glucose 70 - 99 mg/dL 95  BUN 6 - 23 mg/dL 13  Creatinine 0.40 - 1.20 mg/dL 0.73  Sodium 135 - 145 mEq/L 139  Potassium 3.5 - 5.1 mEq/L 4.8  Chloride 96 - 112 mEq/L 104  CO2 19 - 32 mEq/L 30  Calcium 8.4 - 10.5 mg/dL 9.6  Total Protein 6.5 - 8.1 g/dL -  Total Bilirubin 0.3 - 1.2 mg/dL -  Alkaline Phos 38 - 126 U/L -  AST 15 - 41 U/L -  ALT 14 - 54 U/L -   CBC Latest Ref Rng & Units 03/10/2019  WBC 4.0 - 10.5 K/uL 5.3  Hemoglobin 12.0 - 15.0 g/dL 13.2  Hematocrit 36.0 - 46.0 % 38.9  Platelets 150 - 400 K/uL 353     Observation/objective: Appears in no acute distress of a video visit today.  Breathing is nonlabored  Assessment and plan: Patient is a 48 year old female with a history of stage Ia invasive mammary carcinoma of the left breast pT1 cN0 M0 ER PR positive HER-2/neu negative status post lumpectomy and adjuvant radiation.  This is a visit to assess ongoing tolerance to tamoxifen  Patient being premenopausal initial plan was OS plus AI but did not work out and patient did not tolerate Lupron.  He was subsequently switched to tamoxifen 20 mg which she again did not tolerate because of fatigue headaches and hot flashes.  Currently patient is off all hormone therapy for the last 2 weeks.  I discussed with the patient that at this time it is imperative that she is on some form of hormone therapy for her breast cancer.  Unfortunately AI is not an option yet without Lupron.  Patient is not conclusively postmenopausal and we will have to wait for another 6 months.  If patient does not have any menstrual cycles for 1 continuous yearly could consider AI at that time.  For now tamoxifen is an only option.  I have recommended that she could try a lower dose of tamoxifen at 10 mg daily as patient is not agreeable to going back to 20 mg.  Patient is willing to try the lower dose.  She wants to wait for another week to 2 weeks before restarting it.  Patient has a history of iron deficiency anemia with I will again check in 3 months time  Follow-up instructions: I will see her back in 3 months with a CBC with differential, ferritin and iron studies and CMP.  Patient knows to call if she has any untoward side effects after she restarts tamoxifen  I discussed the assessment and treatment plan with the patient. The patient was provided an opportunity to ask questions and all were answered. The patient agreed with the plan and demonstrated an understanding of  the instructions.   The patient was advised to call back or seek an in-person evaluation if the symptoms worsen or if the condition fails to improve as anticipated.    Visit Diagnosis: 1. Encounter for follow-up surveillance of breast cancer   2. Encounter for monitoring tamoxifen therapy     Dr. Randa Evens, MD, MPH [facility] [phone] 06/22/2019 8:37 AM

## 2019-06-23 ENCOUNTER — Ambulatory Visit: Payer: BLUE CROSS/BLUE SHIELD | Admitting: Oncology

## 2019-07-24 ENCOUNTER — Encounter: Payer: Self-pay | Admitting: Oncology

## 2019-08-08 ENCOUNTER — Other Ambulatory Visit: Payer: Self-pay

## 2019-08-11 ENCOUNTER — Encounter: Payer: Self-pay | Admitting: Family Medicine

## 2019-08-11 ENCOUNTER — Ambulatory Visit (INDEPENDENT_AMBULATORY_CARE_PROVIDER_SITE_OTHER): Payer: BC Managed Care – PPO | Admitting: Family Medicine

## 2019-08-11 ENCOUNTER — Other Ambulatory Visit: Payer: Self-pay

## 2019-08-11 VITALS — BP 110/70 | HR 73 | Temp 98.3°F | Ht 62.0 in | Wt 156.8 lb

## 2019-08-11 DIAGNOSIS — Z13220 Encounter for screening for lipoid disorders: Secondary | ICD-10-CM

## 2019-08-11 DIAGNOSIS — Z0001 Encounter for general adult medical examination with abnormal findings: Secondary | ICD-10-CM | POA: Diagnosis not present

## 2019-08-11 DIAGNOSIS — E663 Overweight: Secondary | ICD-10-CM | POA: Diagnosis not present

## 2019-08-11 DIAGNOSIS — R5383 Other fatigue: Secondary | ICD-10-CM

## 2019-08-11 NOTE — Patient Instructions (Signed)
Nice to see you. Please try to increase activity with several days of walking per week. Please try to decrease carbohydrate intake and increase proteins and vegetable intake. Please continue to follow with oncology. Please discuss your menstrual cycles with your gynecologist when you see them. We will have you return for labs.

## 2019-08-11 NOTE — Assessment & Plan Note (Signed)
Physical exam completed.  Encouraged exercise with walking several days a week.  Discussed dietary changes with decreasing carbohydrate intake and increasing protein and vegetable intake.  She will see her gynecologist as planned and she will discuss her heavy menstrual cycles with them.  She will continue to follow with oncology.  I will send my note to her oncologist letting them know that the patient has stopped tamoxifen altogether so that they are aware and could consider alternatives though based on their note it seems like that was the only option at this time.  We will check lab work to evaluate for cause of her tiredness though I suspect this is likely related to her work schedule.  I did encourage her to increase her sleep.  Lab work as outlined below.

## 2019-08-11 NOTE — Progress Notes (Signed)
[name], MD Phone: [phone]  Melinda Holder is a 48 y.o. female who presents today for CPE.  Exercise: Not exercising due to tiredness after working. Diet: Not good.  She is eating lots of carbs.  Not many vegetables. Pap smear up-to-date 02/24/2017.  She has an appointment with GYN for follow-up next month. Mammogram to be completed through oncology. No family history of colon cancer, breast cancer, or ovarian cancer.  She reports her mother had lymphoma. Tetanus vaccine up-to-date. HIV screening up-to-date. No tobacco use or illicit drug use.  She drinks 6 beers weekly. Sees a dentist and an ophthalmologist. She reports she has a menstrual cycle once monthly lasting 5 days and she reports the first 3 days are very heavy with changing a pad numerous times per hour.  She is sexually active with one female partner. She does have a history of breast cancer and follows with oncology.  She notes she is not taking tamoxifen at this time due to side effects. She does report feeling tired most of the time.  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  She works 60 hours a week.  She denies any depression.  She gets 7 to 8 hours of sleep nightly.  Active Ambulatory Problems    Diagnosis Date Noted  . Pruritus 09/03/2015  . Overweight (BMI 25.0-29.9) 09/03/2015  . Encounter for general adult medical examination with abnormal findings 02/27/2016  . Adnexal pain 11/18/2012  . Ankle swelling, right 05/20/2017  . Iron deficiency anemia 05/20/2017  . Constipation 05/20/2017  . Enteritis 06/30/2017  . Malrotation of intestine   . Elevated glucose 06/07/2018  . Acute gastritis without hemorrhage   . Acute duodenitis   . Other specified diseases of intestine   . Malignant neoplasm of left female breast ([place]) 11/03/2018  . 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[place])   . Goals of care, counseling/discussion 01/05/2019   Resolved Ambulatory Problems   Diagnosis Date Noted  . Encounter to establish care 09/03/2015  . Need for prophylactic vaccination with combined diphtheria-tetanus-pertussis (DTP) vaccine 09/03/2015  . Encounter for immunization 09/03/2015  . Need for Tdap vaccination 09/03/2015   Past Medical History:  Diagnosis Date  . Amblyopia of eye, right   . Anemia   . Asthma   . Cancer ([place]) 10/27/2018  . Difficult intubation 11/03/2016  . Endometriosis   . GERD (gastroesophageal reflux disease)     Family History  Problem Relation Age of Onset  . Lupus Mother   . Hypertension Mother   . Lymphoma Mother 51       B cell; currently 56  . Hypertension Father        currently 6  . Diabetes Father   . Breast cancer Paternal Aunt 10       deceased 28  . Lung cancer Paternal Aunt 61       deceased 11  . Brain cancer Paternal Grandfather 32       deceased 79    Social History   Socioeconomic History  . Marital status: Married    Spouse name: 0  . Number of children: 0  . Years of education: Not on file  . Highest education level: Associate degree: academic program  Occupational History  . Occupation: Librarian, academic    Comment: Actuary  Social Needs  . Financial resource strain: Not on file  . Food insecurity    Worry: Not on file    Inability: Not on  file  . Transportation needs    Medical: Not on file    Non-medical: Not on file  Tobacco Use  . Smoking status: Former Smoker    Packs/day: 1.00    Years: 15.00    Pack years: 15.00    Types: Cigarettes    Quit date: 02/21/2015    Years since quitting: 4.4  . Smokeless tobacco: Never Used  Substance and Sexual Activity  . Alcohol use: Yes    Alcohol/week: 2.0 standard drinks    Types: 2 Cans of beer per week    Comment: Occasional   . Drug use: No  . Sexual activity: Yes    Partners: Male    Comment: 1 female  Lifestyle  . Physical activity    Days per week: Not on file    Minutes per session: Not on file  . Stress: Not on file   Relationships  . Social Herbalist on phone: Not on file    Gets together: Not on file    Attends religious service: Not on file    Active member of club or organization: Not on file    Attends meetings of clubs or organizations: Not on file    Relationship status: Not on file  . Intimate partner violence    Fear of current or ex partner: Not on file    Emotionally abused: Not on file    Physically abused: Not on file    Forced sexual activity: Not on file  Other Topics Concern  . 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 CV:  Negative for chest pain, dyspnea, edema, palpitations Resp: Negative for cough, dyspnea, hemoptysis GI: Negative for nausea, vomiting, diarrhea, constipation, abdominal pain, melena, hematochezia. GU: Negative for dysuria, incontinence, urinary hesitance, hematuria, vaginal or penile discharge, polyuria, sexual difficulty, lumps in testicle or breasts MSK: Negative for muscle cramps or aches, joint pain or swelling Neuro: Negative for headaches, weakness, numbness, dizziness, passing out/fainting Psych: Negative for depression, anxiety, memory problems  Objective  Physical Exam Vitals:   08/11/19 0943  BP: 110/70  Pulse: 73  Temp: 98.3 F (36.8 C)  SpO2: 99%    BP Readings from Last 3 Encounters:  08/11/19 110/70  06/02/19 124/68  04/10/19 131/83   Wt Readings from Last 3 Encounters:  08/11/19 156 lb 12.8 oz (71.1 kg)  06/02/19 158 lb (71.7 kg)  04/10/19 156 lb 12 oz (71.1 kg)    Physical Exam Constitutional:      General: She is not in  acute distress.    Appearance: She is not diaphoretic.  HENT:     Head: Normocephalic and atraumatic.  Eyes:     Conjunctiva/sclera: Conjunctivae normal.     Pupils: Pupils are equal, round, and reactive to light.  Cardiovascular:     Rate and Rhythm: Normal rate and regular rhythm.     Heart sounds: Normal heart sounds.  Pulmonary:     Effort: Pulmonary effort is normal.     Breath sounds: Normal breath sounds.  Abdominal:  General: Bowel sounds are normal. There is no distension.     Palpations: Abdomen is soft.     Tenderness: There is no abdominal tenderness. There is no guarding or rebound.  Musculoskeletal:     Right lower leg: No edema.     Left lower leg: No edema.  Lymphadenopathy:     Cervical: No cervical adenopathy.  Skin:    General: Skin is warm and dry.  Neurological:     Mental Status: She is alert.  Psychiatric:        Mood and Affect: Mood normal.      Assessment/Plan:   Encounter for general adult medical examination with abnormal findings Physical exam completed.  Encouraged exercise with walking several days a week.  Discussed dietary changes with decreasing carbohydrate intake and increasing protein and vegetable intake.  She will see her gynecologist as planned and she will discuss her heavy menstrual cycles with them.  She will continue to follow with oncology.  I will send my note to her oncologist letting them know that the patient has stopped tamoxifen altogether so that they are aware and could consider alternatives though based on their note it seems like that was the only option at this time.  We will check lab work to evaluate for cause of her tiredness though I suspect this is likely related to her work schedule.  I did encourage her to increase her sleep.  Lab work as outlined below.   Orders Placed This Encounter  Procedures  . Lipid panel    Standing Status:   Future    Standing Expiration Date:   08/10/2020  . Comp Met (CMET)     Standing Status:   Future    Standing Expiration Date:   08/10/2020  . Hemoglobin A1c    Standing Status:   Future    Standing Expiration Date:   08/10/2020  . TSH    Standing Status:   Future    Standing Expiration Date:   08/10/2020  . B12    Standing Status:   Future    Standing Expiration Date:   08/10/2020  . CBC    Standing Status:   Future    Standing Expiration Date:   08/10/2020    No orders of the defined types were placed in this encounter.    [name], MD [place]

## 2019-08-25 ENCOUNTER — Other Ambulatory Visit: Payer: Self-pay

## 2019-08-25 ENCOUNTER — Other Ambulatory Visit (INDEPENDENT_AMBULATORY_CARE_PROVIDER_SITE_OTHER): Payer: BC Managed Care – PPO

## 2019-08-25 DIAGNOSIS — Z13220 Encounter for screening for lipoid disorders: Secondary | ICD-10-CM

## 2019-08-25 DIAGNOSIS — R5383 Other fatigue: Secondary | ICD-10-CM

## 2019-08-25 DIAGNOSIS — E663 Overweight: Secondary | ICD-10-CM | POA: Diagnosis not present

## 2019-08-25 LAB — CBC
HCT: 40.6 % (ref 36.0–46.0)
Hemoglobin: 13.6 g/dL (ref 12.0–15.0)
MCHC: 33.4 g/dL (ref 30.0–36.0)
MCV: 95.6 fl (ref 78.0–100.0)
Platelets: 407 10*3/uL — ABNORMAL HIGH (ref 150.0–400.0)
RBC: 4.25 Mil/uL (ref 3.87–5.11)
RDW: 13.7 % (ref 11.5–15.5)
WBC: 6.6 10*3/uL (ref 4.0–10.5)

## 2019-08-25 LAB — LIPID PANEL
Cholesterol: 182 mg/dL (ref 0–200)
HDL: 44.4 mg/dL (ref >39.00)
LDL Cholesterol: 121 mg/dL — ABNORMAL HIGH (ref 0–99)
NonHDL: 137.89
Total CHOL/HDL Ratio: 4
Triglycerides: 86 mg/dL (ref 0.0–149.0)
VLDL: 17.2 mg/dL (ref 0.0–40.0)

## 2019-08-25 LAB — COMPREHENSIVE METABOLIC PANEL
ALT: 12 U/L (ref 0–35)
AST: 15 U/L (ref 0–37)
Albumin: 4.4 g/dL (ref 3.5–5.2)
Alkaline Phosphatase: 49 U/L (ref 39–117)
BUN: 11 mg/dL (ref 6–23)
CO2: 26 mEq/L (ref 19–32)
Calcium: 9.5 mg/dL (ref 8.4–10.5)
Chloride: 102 mEq/L (ref 96–112)
Creatinine, Ser: 0.7 mg/dL (ref 0.40–1.20)
GFR: 89.56 mL/min (ref >60.00)
Glucose, Bld: 93 mg/dL (ref 70–99)
Potassium: 4.6 mEq/L (ref 3.5–5.1)
Sodium: 137 mEq/L (ref 135–145)
Total Bilirubin: 0.3 mg/dL (ref 0.2–1.2)
Total Protein: 7.3 g/dL (ref 6.0–8.3)

## 2019-08-25 LAB — HEMOGLOBIN A1C: Hgb A1c MFr Bld: 5.8 % (ref 4.6–6.5)

## 2019-08-25 LAB — VITAMIN B12: Vitamin B-12: 353 pg/mL (ref 211–911)

## 2019-08-25 LAB — TSH: TSH: 1.3 u[IU]/mL (ref 0.35–4.50)

## 2019-09-13 ENCOUNTER — Other Ambulatory Visit: Payer: Self-pay

## 2019-09-14 ENCOUNTER — Other Ambulatory Visit: Payer: Self-pay

## 2019-09-14 ENCOUNTER — Encounter: Payer: Self-pay | Admitting: Radiation Oncology

## 2019-09-14 ENCOUNTER — Ambulatory Visit
Admission: RE | Admit: 2019-09-14 | Discharge: 2019-09-14 | Disposition: A | Discharge status: Home / Self Care | Payer: BC Managed Care – PPO | Source: Ambulatory Visit | Attending: Radiation Oncology | Admitting: Radiation Oncology

## 2019-09-14 ENCOUNTER — Other Ambulatory Visit: Payer: Self-pay | Admitting: *Deleted

## 2019-09-14 VITALS — BP 119/85 | HR 70 | Temp 97.8°F | Resp 18 | Wt 156.7 lb

## 2019-09-14 DIAGNOSIS — Z923 Personal history of irradiation: Secondary | ICD-10-CM | POA: Diagnosis not present

## 2019-09-14 DIAGNOSIS — C50112 Malignant neoplasm of central portion of left female breast: Secondary | ICD-10-CM | POA: Insufficient documentation

## 2019-09-14 DIAGNOSIS — Z170 Estrogen receptor positive status [ER+]: Secondary | ICD-10-CM | POA: Diagnosis not present

## 2019-09-14 NOTE — Progress Notes (Signed)
Radiation Oncology Follow up Note  Name: Melinda Holder   Date:   09/14/2019 MRN:  [id number] DOB: Jan 07, 1971    This 48 y.o. female presents to the clinic today for 53-month follow-up status post whole breast radiation to her left breast for stage I ER PR positive invasive mammary carcinoma.  REFERRING PROVIDER: Leone Haven, MD  HPI: Patient is a 48 year old female now out 6 months having completed whole breast radiation to her left breast for stage I ER PR positive invasive mammary carcinoma..  She is seen today in routine follow-up is doing well.  She specifically denies breast tenderness cough or bone pain.  There is been problems with her AI and this is being monitored by Dr. Janese Banks.  She had low tolerance for tamoxifen and also did not tolerate Lupron.  She is not yet had a follow-up mammogram.  COMPLICATIONS OF TREATMENT: none  FOLLOW UP COMPLIANCE: keeps appointments   PHYSICAL EXAM:  BP 119/85   Pulse 70   Temp 97.8 F (36.6 C)   Resp 18   Wt 156 lb 11.2 oz (71.1 kg)   BMI 28.66 kg/m  Lungs are clear to A&P cardiac examination essentially unremarkable with regular rate and rhythm. No dominant mass or nodularity is noted in either breast in 2 positions examined. Incision is well-healed. No axillary or supraclavicular adenopathy is appreciated. Cosmetic result is excellent.  Well-developed well-nourished patient in NAD. HEENT reveals PERLA, EOMI, discs not visualized.  Oral cavity is clear. No oral mucosal lesions are identified. Neck is clear without evidence of cervical or supraclavicular adenopathy. Lungs are clear to A&P. Cardiac examination is essentially unremarkable with regular rate and rhythm without murmur rub or thrill. Abdomen is benign with no organomegaly or masses noted. Motor sensory and DTR levels are equal and symmetric in the upper and lower extremities. Cranial nerves II through XII are grossly intact. Proprioception is intact. No peripheral adenopathy or  edema is identified. No motor or sensory levels are noted. Crude visual fields are within normal range.  RADIOLOGY RESULTS: Diagnostic mammograms have been ordered  PLAN: Present time patient is doing well with no evidence of disease.  She continues follow-up care with Dr. Janese Banks and they are monitoring her menstrual cycles for determination of proper antiestrogen therapy.  I have ordered bilateral diagnostic mammograms.  I will review those mammograms when they become available.  Of asked to see her back in 6 months for follow-up and then will start once a year follow-up visits.  Patient knows to call with any concerns at any time.  I would like to take this opportunity to thank you for allowing me to participate in the care of your patient.Noreene Filbert, MD

## 2019-09-20 ENCOUNTER — Encounter: Payer: Self-pay | Admitting: Oncology

## 2019-09-20 ENCOUNTER — Other Ambulatory Visit: Payer: Self-pay

## 2019-09-20 ENCOUNTER — Other Ambulatory Visit: Payer: Self-pay | Admitting: *Deleted

## 2019-09-20 DIAGNOSIS — Z23 Encounter for immunization: Secondary | ICD-10-CM | POA: Insufficient documentation

## 2019-09-21 ENCOUNTER — Inpatient Hospital Stay: Payer: BC Managed Care – PPO | Attending: Oncology

## 2019-09-21 ENCOUNTER — Other Ambulatory Visit: Payer: Self-pay

## 2019-09-21 ENCOUNTER — Inpatient Hospital Stay (HOSPITAL_BASED_OUTPATIENT_CLINIC_OR_DEPARTMENT_OTHER): Payer: BC Managed Care – PPO | Admitting: Oncology

## 2019-09-21 ENCOUNTER — Inpatient Hospital Stay: Payer: BC Managed Care – PPO

## 2019-09-21 VITALS — BP 121/84 | HR 88 | Resp 18 | Wt 158.0 lb

## 2019-09-21 DIAGNOSIS — C50812 Malignant neoplasm of overlapping sites of left female breast: Secondary | ICD-10-CM | POA: Diagnosis not present

## 2019-09-21 DIAGNOSIS — J45909 Unspecified asthma, uncomplicated: Secondary | ICD-10-CM | POA: Diagnosis not present

## 2019-09-21 DIAGNOSIS — Z170 Estrogen receptor positive status [ER+]: Secondary | ICD-10-CM | POA: Insufficient documentation

## 2019-09-21 DIAGNOSIS — D509 Iron deficiency anemia, unspecified: Secondary | ICD-10-CM

## 2019-09-21 DIAGNOSIS — Z807 Family history of other malignant neoplasms of lymphoid, hematopoietic and related tissues: Secondary | ICD-10-CM | POA: Diagnosis not present

## 2019-09-21 DIAGNOSIS — Z853 Personal history of malignant neoplasm of breast: Secondary | ICD-10-CM

## 2019-09-21 DIAGNOSIS — C50912 Malignant neoplasm of unspecified site of left female breast: Secondary | ICD-10-CM

## 2019-09-21 DIAGNOSIS — Z808 Family history of malignant neoplasm of other organs or systems: Secondary | ICD-10-CM | POA: Diagnosis not present

## 2019-09-21 DIAGNOSIS — D508 Other iron deficiency anemias: Secondary | ICD-10-CM | POA: Insufficient documentation

## 2019-09-21 DIAGNOSIS — Z08 Encounter for follow-up examination after completed treatment for malignant neoplasm: Secondary | ICD-10-CM

## 2019-09-21 DIAGNOSIS — Z87891 Personal history of nicotine dependence: Secondary | ICD-10-CM | POA: Diagnosis not present

## 2019-09-21 DIAGNOSIS — Z23 Encounter for immunization: Secondary | ICD-10-CM

## 2019-09-21 DIAGNOSIS — Z923 Personal history of irradiation: Secondary | ICD-10-CM | POA: Insufficient documentation

## 2019-09-21 DIAGNOSIS — Z803 Family history of malignant neoplasm of breast: Secondary | ICD-10-CM | POA: Diagnosis not present

## 2019-09-21 LAB — COMPREHENSIVE METABOLIC PANEL
ALT: 15 U/L (ref 0–44)
AST: 17 U/L (ref 15–41)
Albumin: 4.2 g/dL (ref 3.5–5.0)
Alkaline Phosphatase: 42 U/L (ref 38–126)
Anion gap: 10 (ref 5–15)
BUN: 11 mg/dL (ref 6–20)
CO2: 26 mmol/L (ref 22–32)
Calcium: 9.2 mg/dL (ref 8.9–10.3)
Chloride: 105 mmol/L (ref 98–111)
Creatinine, Ser: 0.58 mg/dL (ref 0.44–1.00)
GFR calc Af Amer: >60 mL/min (ref >60)
GFR calc non Af Amer: >60 mL/min (ref >60)
Glucose, Bld: 100 mg/dL — ABNORMAL HIGH (ref 70–99)
Potassium: 4.5 mmol/L (ref 3.5–5.1)
Sodium: 141 mmol/L (ref 135–145)
Total Bilirubin: 0.4 mg/dL (ref 0.3–1.2)
Total Protein: 7.6 g/dL (ref 6.5–8.1)

## 2019-09-21 LAB — IRON AND TIBC
Iron: 51 ug/dL (ref 28–170)
Saturation Ratios: 14 % (ref 10.4–31.8)
TIBC: 370 ug/dL (ref 250–450)
UIBC: 319 ug/dL

## 2019-09-21 LAB — CBC WITH DIFFERENTIAL/PLATELET
Abs Immature Granulocytes: 0.01 10*3/uL (ref 0.00–0.07)
Basophils Absolute: 0 10*3/uL (ref 0.0–0.1)
Basophils Relative: 1 %
Eosinophils Absolute: 0.3 10*3/uL (ref 0.0–0.5)
Eosinophils Relative: 5 %
HCT: 39.8 % (ref 36.0–46.0)
Hemoglobin: 13.2 g/dL (ref 12.0–15.0)
Immature Granulocytes: 0 %
Lymphocytes Relative: 23 %
Lymphs Abs: 1.4 10*3/uL (ref 0.7–4.0)
MCH: 31.7 pg (ref 26.0–34.0)
MCHC: 33.2 g/dL (ref 30.0–36.0)
MCV: 95.4 fL (ref 80.0–100.0)
Monocytes Absolute: 0.5 10*3/uL (ref 0.1–1.0)
Monocytes Relative: 9 %
Neutro Abs: 3.7 10*3/uL (ref 1.7–7.7)
Neutrophils Relative %: 62 %
Platelets: 411 10*3/uL — ABNORMAL HIGH (ref 150–400)
RBC: 4.17 MIL/uL (ref 3.87–5.11)
RDW: 12.9 % (ref 11.5–15.5)
WBC: 5.9 10*3/uL (ref 4.0–10.5)
nRBC: 0 % (ref 0.0–0.2)

## 2019-09-21 LAB — FERRITIN: Ferritin: 14 ng/mL (ref 11–307)

## 2019-09-21 MED ORDER — INFLUENZA VAC SPLIT QUAD 0.5 ML IM SUSY
0.5000 mL | PREFILLED_SYRINGE | Freq: Once | INTRAMUSCULAR | Status: DC
  Filled 2019-09-21: qty 0.5

## 2019-09-21 NOTE — Progress Notes (Signed)
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 She has received Feraheme in the past last in March 2019 and her hemoglobin had improved. Patient also has thrombocytosis dating back to 2017. Jak 2 mutation testing has been negative. Bone marrow biopsy not done at this time as she is less than 21 years of age with no prior history of thrombosis. She is now seeing me for her new diagnosis of breast cancer. Patient has not had any prior abnormal mammograms or breast biopsies palpated a breast mass at the 12 o'clock position of her left breast which was followed by screening and diagnostic mammogram. It showed 1.1 cm left breast mass at the 12 o'clock position 3 cm from the nipple. Multiple benign cysts in the upper quadrant of the left breast. No axillary lymphadenopathy. This was biopsied and biopsy showed 7 mm grade 2 invasive mammary carcinoma strongly ER PR positive and HER-2/neu negative.   Final pathology showed invasive mammary carcinoma, 18 mm, grade 1. 0 out of 7 sentinel lymph nodes were positive for malignancy. pT1cpN0. Inferior margin was positive and patientunderwent reexcision surgery with negative margins and no residual carcinoma.Oncotype testing came back with intermediate risk with a score of 20 translating to a absolute benefit of chemotherapy of about 1.6%. OS plus AI was recommended  without adjuvant chemotherapy. Patient received her first dose of Lupron in January 2020. She completed adjuvant radiation treatment Patient received 3 doses of Lupron but had significant headaches and hot flashes that was affecting her quality of life and patient chose not to continue with Lupron.  Because she was premenopausal at diagnosis she was started on tamoxifen 20 mg daily. She stopped taking it because of headaches.  Interval history- she reports her headaches are better after she stopped taking tamoxifen. They are still there but milder not as frequent and more tolerable  ECOG PS- 0 Pain scale- 0   Review of systems- Review of Systems  Constitutional: Negative for chills, fever, malaise/fatigue and weight loss.  HENT: Negative for congestion, ear discharge and nosebleeds.   Eyes: Negative for blurred vision.  Respiratory: Negative for cough, hemoptysis, sputum production, shortness of breath and wheezing.   Cardiovascular: Negative for chest pain, palpitations, orthopnea and claudication.  Gastrointestinal: Negative for abdominal pain, blood in stool, constipation, diarrhea, heartburn, melena, nausea and vomiting.  Genitourinary: Negative for dysuria, flank pain, frequency, hematuria and urgency.  Musculoskeletal: Negative for back pain, joint pain and myalgias.  Skin: Negative for rash.  Neurological: Positive for headaches. Negative for dizziness, tingling, focal weakness, seizures and weakness.  Endo/Heme/Allergies: Does not bruise/bleed easily.  Psychiatric/Behavioral: Negative for depression and suicidal ideas. The patient does not have insomnia.       No Known Allergies   Past Medical History:  Diagnosis Date   Amblyopia of eye, right    Anemia    Asthma    childhood   Cancer ([place]) 10/27/2018   left breast  Difficult intubation 11/03/2016   DL x 2 with MAC 3 (CRNA and MD), very anterior. DL x 2 with Glidescope #3 Lo pro, bougie utilized. Easy mask.    Endometriosis    GERD (gastroesophageal reflux disease)      Past Surgical History:  Procedure Laterality Date   ABLATION ON ENDOMETRIOSIS     BREAST BIOPSY Left 10/28/2018   INVASIVE MAMMARY CARCINOMA   BREAST EXCISIONAL BIOPSY Left 10/28/2018   BREAST LUMPECTOMY Left 11/17/2018   BREAST LUMPECTOMY Left 12/19/2018   Procedure: BREAST LUMPECTOMY;  Surgeon: Jules Husbands, MD;  Location: [facility];  Service: General;  Laterality: Left;   BREAST LUMPECTOMY WITH NEEDLE LOCALIZATION Left 11/17/2018   Procedure: BREAST LUMPECTOMY WITH NEEDLE LOCALIZATION;  Surgeon: Jules Husbands, MD;  Location: [facility];  Service: General;  Laterality: Left;   CLAVICLE SURGERY Left 10/2016   COLONOSCOPY WITH PROPOFOL N/A 06/27/2018   Procedure: COLONOSCOPY WITH PROPOFOL;  Surgeon: Lucilla Lame, MD;  Location: [place];  Service: Endoscopy;  Laterality: N/A;   ESOPHAGOGASTRODUODENOSCOPY (EGD) WITH PROPOFOL N/A 06/27/2018   Procedure: ESOPHAGOGASTRODUODENOSCOPY (EGD) WITH PROPOFOL;  Surgeon: Lucilla Lame, MD;  Location: [place];  Service: Endoscopy;  Laterality: N/A;   EYE MUSCLE SURGERY Right    child   ORIF CLAVICULAR FRACTURE Left 11/03/2016   Procedure: OPEN REDUCTION INTERNAL FIXATION (ORIF) CLAVICULAR FRACTURE;  Surgeon: Corky Mull, MD;  Location: [facility];  Service: Orthopedics;  Laterality: Left;   removal of fibroid     SENTINEL NODE BIOPSY Left 11/17/2018   Procedure: SENTINEL NODE BIOPSY;  Surgeon: Jules Husbands, MD;  Location: [facility];  Service: General;  Laterality: Left;    Social History   Socioeconomic History   Marital status: Married    Spouse name: 0   Number of children: 0   Years of education: Not on file   Highest education level: Associate degree: academic program  Occupational History   Occupation: Librarian, academic    Comment: Education officer, museum strain: Not on file   Food insecurity    Worry: Not on  file    Inability: Not on file   Transportation needs    Medical: Not on file    Non-medical: Not on file  Tobacco Use   Smoking status: Former Smoker    Packs/day: 1.00    Years: 15.00    Pack years: 15.00    Types: Cigarettes    Quit date: 02/21/2015    Years since quitting: 4.5   Smokeless tobacco: Never Used  Substance and Sexual Activity   Alcohol use: Yes    Alcohol/week: 2.0 standard drinks    Types: 2 Cans of beer per week    Comment: Occasional    Drug use: No   Sexual activity: Yes    Partners: Male    Comment: 1 female  Lifestyle   Physical activity    Days per week: Not on file    Minutes per session: Not on file   Stress: Not on file  Relationships   Social connections    Talks on phone: Not on file    Gets together: Not on file    Attends religious service: Not on file    Active member of club or organization: Not on file    Attends meetings of clubs or organizations: Not on file    Relationship status: Not on file   Intimate partner violence    Fear of current or  ex partner: Not on file    Emotionally abused: Not on file    Physically abused: Not on file    Forced sexual activity: Not on file  Other Topics Concern   Not on file  Social History Narrative   Works- Engineer, 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    Children- 4 step children    Pets: None    Caffeine- Tea 3 cups and Coffee 2 cups     Family History  Problem Relation Age of Onset   Lupus Mother    Hypertension Mother    Lymphoma Mother 57       B cell; currently 9   Hypertension Father        currently 38   Diabetes Father    Breast cancer Paternal Aunt 49       deceased 90   Lung cancer Paternal Aunt 50       deceased 58   Brain cancer Paternal Grandfather 30       deceased 72     Current Outpatient Medications:    HYDROcodone-acetaminophen (NORCO/VICODIN) 5-325 MG tablet, Take 1 tablet by mouth every 6 (six) hours as needed for moderate pain.,  Disp: 20 tablet, Rfl: 0   hydrOXYzine (ATARAX/VISTARIL) 10 MG tablet, TAKE 1 TABLET BY MOUTH EVERYDAY AT BEDTIME, Disp: 90 tablet, Rfl: 2   ibuprofen (ADVIL,MOTRIN) 200 MG tablet, Take 400 mg by mouth 2 (two) times daily as needed for headache or moderate pain. , Disp: , Rfl:    polyethylene glycol (MIRALAX / GLYCOLAX) packet, Take 17 g by mouth daily as needed for moderate constipation., Disp: , Rfl:    mupirocin ointment (BACTROBAN) 2 %, Apply 1 application topically 2 (two) times daily. (Patient not taking: Reported on 09/20/2019), Disp: 22 g, Rfl: 0   omeprazole (PRILOSEC) 20 MG capsule, Take 1 capsule (20 mg total) by mouth 2 (two) times daily before a meal. (Patient taking differently: Take 20 mg by mouth 2 (two) times daily as needed. ), Disp: 60 capsule, Rfl: 1   tamoxifen (NOLVADEX) 10 MG tablet, Take 1 tablet (10 mg total) by mouth daily. (Patient not taking: Reported on 09/20/2019), Disp: 30 tablet, Rfl: 3  Physical exam:  Vitals:   09/21/19 1048  BP: 121/84  Pulse: 88  Resp: 18  Weight: 158 lb (71.7 kg)   Physical Exam Constitutional:      General: She is not in acute distress. HENT:     Head: Normocephalic and atraumatic.  Eyes:     Pupils: Pupils are equal, round, and reactive to light.  Neck:     Musculoskeletal: Normal range of motion.  Cardiovascular:     Rate and Rhythm: Normal rate and regular rhythm.     Heart sounds: Normal heart sounds.  Pulmonary:     Effort: Pulmonary effort is normal.     Breath sounds: Normal breath sounds.  Abdominal:     General: Bowel sounds are normal.     Palpations: Abdomen is soft.  Skin:    General: Skin is warm and dry.  Neurological:     Mental Status: She is alert and oriented to person, place, and time.      CMP Latest Ref Rng & Units 09/21/2019  Glucose 70 - 99 mg/dL 100(H)  BUN 6 - 20 mg/dL 11  Creatinine 0.44 - 1.00 mg/dL 0.58  Sodium 135 - 145 mmol/L 141  Potassium 3.5 - 5.1 mmol/L 4.5  Chloride 98 - 111  mmol/L 105  CO2 22 - 32 mmol/L 26  Calcium 8.9 - 10.3 mg/dL 9.2  Total Protein 6.5 - 8.1 g/dL 7.6  Total Bilirubin 0.3 - 1.2 mg/dL 0.4  Alkaline Phos 38 - 126 U/L 42  AST 15 - 41 U/L 17  ALT 0 - 44 U/L 15   CBC Latest Ref Rng & Units 09/21/2019  WBC 4.0 - 10.5 K/uL 5.9  Hemoglobin 12.0 - 15.0 g/dL 13.2  Hematocrit 36.0 - 46.0 % 39.8  Platelets 150 - 400 K/uL 411(H)      Assessment and plan- Patient is a 48 y.o. female with a history of stage Ia invasive mammary carcinoma of the left breast pT1 cN0 M0 ER PR positive HER-2/neu negative status post lumpectomy and adjuvant radiation. She is here for following issues:  1. Breast cancer- currently patient is not taking any hormonal therapy. She could neither tolerate AI+OS nor tamoxifen. She still has ongoing menstrual cycles. She understands that her risk of breast cancer recurrence is higher without hormone therapy. ckinically doing well and no concerning symptoms of recurrence. She is due for mammogram next month. I will see her back in 6 months for routine physical. She had breast exam last week by Dr. Baruch Gouty.   2. Iron deficiency anemia: likely due to menorrhagia. She will be discussing her options with GYN. If she undergoes hysterectomy in the future, she may want to consider b/l oopherectomy at the same time. This would allow her to go back on trial of AI without medical ovarian suppression.   Visit Diagnosis 1. Malignant neoplasm of left breast in female, estrogen receptor positive, unspecified site of breast ([place])   2. Iron deficiency anemia, unspecified iron deficiency anemia type   3. Encounter for follow-up surveillance of breast cancer      Dr. Randa Evens, MD, MPH [facility] at [facility] [phone] 09/21/2019 3:16 PM

## 2019-09-25 ENCOUNTER — Emergency Department: Payer: BC Managed Care – PPO

## 2019-09-25 ENCOUNTER — Emergency Department
Admission: EM | Admit: 2019-09-25 | Discharge: 2019-09-25 | Disposition: A | Discharge status: Home / Self Care | Payer: BC Managed Care – PPO | Attending: Emergency Medicine | Admitting: Emergency Medicine

## 2019-09-25 ENCOUNTER — Other Ambulatory Visit: Payer: Self-pay

## 2019-09-25 ENCOUNTER — Inpatient Hospital Stay: Payer: BC Managed Care – PPO

## 2019-09-25 DIAGNOSIS — R109 Unspecified abdominal pain: Secondary | ICD-10-CM | POA: Diagnosis not present

## 2019-09-25 DIAGNOSIS — R001 Bradycardia, unspecified: Secondary | ICD-10-CM | POA: Diagnosis not present

## 2019-09-25 DIAGNOSIS — Z20822 Contact with and (suspected) exposure to covid-19: Secondary | ICD-10-CM

## 2019-09-25 DIAGNOSIS — R112 Nausea with vomiting, unspecified: Secondary | ICD-10-CM | POA: Insufficient documentation

## 2019-09-25 DIAGNOSIS — Z79899 Other long term (current) drug therapy: Secondary | ICD-10-CM | POA: Insufficient documentation

## 2019-09-25 DIAGNOSIS — Z20828 Contact with and (suspected) exposure to other viral communicable diseases: Secondary | ICD-10-CM | POA: Insufficient documentation

## 2019-09-25 DIAGNOSIS — Z853 Personal history of malignant neoplasm of breast: Secondary | ICD-10-CM | POA: Diagnosis not present

## 2019-09-25 DIAGNOSIS — R1110 Vomiting, unspecified: Secondary | ICD-10-CM | POA: Diagnosis not present

## 2019-09-25 DIAGNOSIS — R197 Diarrhea, unspecified: Secondary | ICD-10-CM | POA: Insufficient documentation

## 2019-09-25 DIAGNOSIS — Z87891 Personal history of nicotine dependence: Secondary | ICD-10-CM | POA: Diagnosis not present

## 2019-09-25 LAB — COMPREHENSIVE METABOLIC PANEL
ALT: 13 U/L (ref 0–44)
AST: 20 U/L (ref 15–41)
Albumin: 4.2 g/dL (ref 3.5–5.0)
Alkaline Phosphatase: 43 U/L (ref 38–126)
Anion gap: 9 (ref 5–15)
BUN: 12 mg/dL (ref 6–20)
CO2: 26 mmol/L (ref 22–32)
Calcium: 9.1 mg/dL (ref 8.9–10.3)
Chloride: 104 mmol/L (ref 98–111)
Creatinine, Ser: 0.81 mg/dL (ref 0.44–1.00)
GFR calc Af Amer: >60 mL/min (ref >60)
GFR calc non Af Amer: >60 mL/min (ref >60)
Glucose, Bld: 104 mg/dL — ABNORMAL HIGH (ref 70–99)
Potassium: 4.1 mmol/L (ref 3.5–5.1)
Sodium: 139 mmol/L (ref 135–145)
Total Bilirubin: 0.5 mg/dL (ref 0.3–1.2)
Total Protein: 7.5 g/dL (ref 6.5–8.1)

## 2019-09-25 LAB — URINALYSIS, COMPLETE (UACMP) WITH MICROSCOPIC
Bacteria, UA: NONE SEEN
Bilirubin Urine: NEGATIVE
Glucose, UA: NEGATIVE mg/dL
Hgb urine dipstick: NEGATIVE
Ketones, ur: NEGATIVE mg/dL
Leukocytes,Ua: NEGATIVE
Nitrite: NEGATIVE
Protein, ur: NEGATIVE mg/dL
Specific Gravity, Urine: 1.015 (ref 1.005–1.030)
pH: 7 (ref 5.0–8.0)

## 2019-09-25 LAB — CBC
HCT: 42 % (ref 36.0–46.0)
Hemoglobin: 14 g/dL (ref 12.0–15.0)
MCH: 31.3 pg (ref 26.0–34.0)
MCHC: 33.3 g/dL (ref 30.0–36.0)
MCV: 93.8 fL (ref 80.0–100.0)
Platelets: 423 10*3/uL — ABNORMAL HIGH (ref 150–400)
RBC: 4.48 MIL/uL (ref 3.87–5.11)
RDW: 13.2 % (ref 11.5–15.5)
WBC: 4.2 10*3/uL (ref 4.0–10.5)
nRBC: 0 % (ref 0.0–0.2)

## 2019-09-25 LAB — LIPASE, BLOOD: Lipase: 27 U/L (ref 11–51)

## 2019-09-25 LAB — POCT PREGNANCY, URINE: Preg Test, Ur: NEGATIVE

## 2019-09-25 MED ORDER — KETOROLAC TROMETHAMINE 30 MG/ML IJ SOLN
15.0000 mg | Freq: Once | INTRAMUSCULAR | Status: AC
  Administered 2019-09-25: 15 mg via INTRAVENOUS
  Filled 2019-09-25: qty 1

## 2019-09-25 MED ORDER — ONDANSETRON HCL 4 MG PO TABS: 4.0000 mg | ORAL_TABLET | Freq: Every day | ORAL | 0 refills | Status: DC | PRN

## 2019-09-25 MED ORDER — IOHEXOL 300 MG/ML  SOLN
100.0000 mL | Freq: Once | INTRAMUSCULAR | Status: AC | PRN
  Administered 2019-09-25: 11:00:00 100 mL via INTRAVENOUS

## 2019-09-25 MED ORDER — SODIUM CHLORIDE 0.9% FLUSH
3.0000 mL | Freq: Once | INTRAVENOUS | Status: AC
  Administered 2019-09-25: 10:00:00 3 mL via INTRAVENOUS

## 2019-09-25 MED ORDER — ACETAMINOPHEN 500 MG PO TABS
1000.0000 mg | ORAL_TABLET | Freq: Once | ORAL | Status: AC
  Administered 2019-09-25: 11:00:00 1000 mg via ORAL
  Filled 2019-09-25: qty 2

## 2019-09-25 NOTE — ED Notes (Signed)
POC Preg results = NEG

## 2019-09-25 NOTE — ED Notes (Signed)
EKG to EDP [place].  

## 2019-09-25 NOTE — ED Triage Notes (Signed)
Pt c/o generalized abd pain with N/V/D since Friday.

## 2019-09-25 NOTE — Discharge Instructions (Addendum)
We are testing you for coronavirus.  You should stay quarantined at home until your results come back.  We will give you some Zofran to help with nausea.  Return to the ER for worsening abdominal pain, unable to take ER any other concerns.

## 2019-09-25 NOTE — ED Notes (Signed)
Pt attempted x2 to sign topaz for d/c but it kept freezing up.

## 2019-09-25 NOTE — ED Notes (Signed)
EKG to be completed once pt back to room.

## 2019-09-25 NOTE — ED Provider Notes (Signed)
[facility] Emergency Department Provider Note  ____________________________________________   First MD Initiated Contact with Patient 09/25/19 1035     (approximate)  I have reviewed the triage vital signs and the nursing notes.   HISTORY  Chief Complaint Abdominal Pain    HPI Melinda Holder is a 48 y.o. female with endometriosis, prior left breast cancer who presents with abdominal pain.  Patient had intermittent abdominal pain since Friday.  The pain is been moderate, she is not taking any relieving factors, nothing seems to make it worse.  Has been associated with nausea as well as a few episodes of diarrhea.  She had a temperature above 101 at home this morning.  Did not take any Tylenol or ibuprofen before coming to the ER.  She is concerned that she may have coronavirus.  Denies any urinary symptoms.  Her breast cancer is in remission.  She got radiation therapy and surgery.  Patient does also have a headache at this time.  However she has been getting headaches daily ever since she got Lupron for her breast cancer. Denies it being different then prior.  She says this is pretty typical for her.  She denies any blood in the stool or vomit.          Past Medical History:  Diagnosis Date  . Amblyopia of eye, right   . Anemia   . Asthma    childhood  . Cancer ([place]) 10/27/2018   left breast  . Difficult intubation 11/03/2016   DL x 2 with MAC 3 (CRNA and MD), very anterior. DL x 2 with Glidescope #3 Lo pro, bougie utilized. Easy mask.  . Endometriosis   . GERD (gastroesophageal reflux disease)     Patient Active Problem List   Diagnosis Date Noted  . Need for prophylactic vaccination and inoculation against influenza 09/20/2019  . Goals of care, counseling/discussion 01/05/2019  . 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[place])   . Malignant neoplasm of left female breast ([place]) 11/03/2018  . Acute gastritis without hemorrhage   .  Acute duodenitis   . Other specified diseases of intestine   . Elevated glucose 06/07/2018  . Enteritis 06/30/2017  . Malrotation of intestine   . Ankle swelling, right 05/20/2017  . Iron deficiency anemia 05/20/2017  . Constipation 05/20/2017  . Encounter for general adult medical examination with abnormal findings 02/27/2016  . Pruritus 09/03/2015  . Overweight (BMI 25.0-29.9) 09/03/2015  . Adnexal pain 11/18/2012    Past Surgical History:  Procedure Laterality Date  . ABLATION ON ENDOMETRIOSIS    . BREAST BIOPSY Left 10/28/2018   INVASIVE MAMMARY CARCINOMA  . BREAST EXCISIONAL BIOPSY Left 10/28/2018  . BREAST LUMPECTOMY Left 11/17/2018  . BREAST LUMPECTOMY Left 12/19/2018   Procedure: BREAST LUMPECTOMY;  Surgeon: Jules Husbands, MD;  Location: [facility];  Service: General;  Laterality: Left;  . BREAST LUMPECTOMY WITH NEEDLE LOCALIZATION Left 11/17/2018   Procedure: BREAST LUMPECTOMY WITH NEEDLE LOCALIZATION;  Surgeon: Jules Husbands, MD;  Location: [facility];  Service: General;  Laterality: Left;  . CLAVICLE SURGERY Left 10/2016  . COLONOSCOPY WITH PROPOFOL N/A 06/27/2018   Procedure: COLONOSCOPY WITH PROPOFOL;  Surgeon: Lucilla Lame, MD;  Location: [place];  Service: Endoscopy;  Laterality: N/A;  . ESOPHAGOGASTRODUODENOSCOPY (EGD) WITH PROPOFOL N/A 06/27/2018   Procedure: ESOPHAGOGASTRODUODENOSCOPY (EGD) WITH PROPOFOL;  Surgeon: Lucilla Lame, MD;  Location: [place];  Service: Endoscopy;  Laterality: N/A;  . EYE MUSCLE SURGERY  Right    child  . ORIF CLAVICULAR FRACTURE Left 11/03/2016   Procedure: OPEN REDUCTION INTERNAL FIXATION (ORIF) CLAVICULAR FRACTURE;  Surgeon: Corky Mull, MD;  Location: [facility];  Service: Orthopedics;  Laterality: Left;  . removal of fibroid    . SENTINEL NODE BIOPSY Left 11/17/2018   Procedure: SENTINEL NODE BIOPSY;  Surgeon: Jules Husbands, MD;  Location: [facility];  Service: General;  Laterality: Left;    Prior to Admission  medications   Medication Sig Start Date End Date Taking? Authorizing Provider  HYDROcodone-acetaminophen (NORCO/VICODIN) 5-325 MG tablet Take 1 tablet by mouth every 6 (six) hours as needed for moderate pain. 12/19/18   Pabon, Diego F, MD  hydrOXYzine (ATARAX/VISTARIL) 10 MG tablet TAKE 1 TABLET BY MOUTH EVERYDAY AT BEDTIME 04/11/19   Leone Haven, MD  ibuprofen (ADVIL,MOTRIN) 200 MG tablet Take 400 mg by mouth 2 (two) times daily as needed for headache or moderate pain.     [provider]  mupirocin ointment (BACTROBAN) 2 % Apply 1 application topically 2 (two) times daily. Patient not taking: Reported on 09/20/2019 06/02/19   Jodelle Green, FNP  omeprazole (PRILOSEC) 20 MG capsule Take 1 capsule (20 mg total) by mouth 2 (two) times daily before a meal. Patient taking differently: Take 20 mg by mouth 2 (two) times daily as needed.  08/02/18   Lucilla Lame, MD  polyethylene glycol (MIRALAX / Floria Raveling) packet Take 17 g by mouth daily as needed for moderate constipation.    [provider]  tamoxifen (NOLVADEX) 10 MG tablet Take 1 tablet (10 mg total) by mouth daily. Patient not taking: Reported on 09/20/2019 06/21/19   Sindy Guadeloupe, MD    Allergies Patient has no known allergies.  Family History  Problem Relation Age of Onset  . Lupus Mother   . Hypertension Mother   . Lymphoma Mother 56       B cell; currently 32  . Hypertension Father        currently 60  . Diabetes Father   . Breast cancer Paternal Aunt 61       deceased 81  . Lung cancer Paternal Aunt 81       deceased 50  . Brain cancer Paternal Grandfather 49       deceased 73    Social History Social History   Tobacco Use  . Smoking status: Former Smoker    Packs/day: 1.00    Years: 15.00    Pack years: 15.00    Types: Cigarettes    Quit date: 02/21/2015    Years since quitting: 4.5  . Smokeless tobacco: Never Used  Substance Use Topics  . Alcohol use: Yes    Alcohol/week: 2.0 standard drinks     Types: 2 Cans of beer per week    Comment: Occasional   . Drug use: No      Review of Systems Constitutional: No fever/chills Eyes: No visual changes. ENT: No sore throat. Cardiovascular: Denies chest pain. Respiratory: Denies shortness of breath. Gastrointestinal: Positive abdominal pain, nausea, diarrhea Genitourinary: Negative for dysuria. Musculoskeletal: Negative for back pain. Skin: Negative for rash. Neurological: Positive headache, no focal weakness or numbness. All other ROS negative ____________________________________________   PHYSICAL EXAM:  VITAL SIGNS: ED Triage Vitals  Enc Vitals Group     BP 09/25/19 0848 (!) 146/83     Pulse Rate 09/25/19 0848 84     Resp 09/25/19 0848 17     Temp 09/25/19 0850 98.5  F (36.9 C)     Temp Source 09/25/19 0848 Oral     SpO2 09/25/19 0848 98 %     Weight 09/25/19 0849 154 lb (69.9 kg)     Height 09/25/19 0849 _0  (1.575 m)     Head Circumference --      Peak Flow --      Pain Score 09/25/19 0848 7     Pain Loc --      Pain Edu? --      Excl. in [place]? --     Constitutional: Alert and oriented. Well appearing and in no acute distress. Eyes: Conjunctivae are normal. EOMI. Head: Atraumatic. Nose: No congestion/rhinnorhea. Mouth/Throat: Mucous membranes are moist.   Neck: No stridor. Trachea Midline. FROM Cardiovascular: Normal rate, regular rhythm. Grossly normal heart sounds.  Good peripheral circulation. Respiratory: Normal respiratory effort.  No retractions. Lungs CTAB. Gastrointestinal: Soft with left lower quadrant tenderness.  No distention. No abdominal bruits.  Musculoskeletal: No lower extremity tenderness nor edema.  No joint effusions. Neurologic:  Normal speech and language. No gross focal neurologic deficits are appreciated.  Skin:  Skin is warm, dry and intact. No rash noted. Psychiatric: Mood and affect are normal. Speech and behavior are normal. GU: Deferred    ____________________________________________   LABS (all labs ordered are listed, but only abnormal results are displayed)  Labs Reviewed  COMPREHENSIVE METABOLIC PANEL - Abnormal; Notable for the following components:      Result Value   Glucose, Bld 104 (*)    All other components within normal limits  CBC - Abnormal; Notable for the following components:   Platelets 423 (*)    All other components within normal limits  URINALYSIS, COMPLETE (UACMP) WITH MICROSCOPIC - Abnormal; Notable for the following components:   Color, Urine YELLOW (*)    APPearance HAZY (*)    All other components within normal limits  LIPASE, BLOOD  POC URINE PREG, ED  POCT PREGNANCY, URINE   ____________________________________________   ED ECG REPORT I, Vanessa Lantana, the attending physician, personally viewed and interpreted this ECG.  EKG is sinus bradycardia rate of 59, no ST elevation, T wave inversion in V2, normal intervals   ___________________________________________  RADIOLOGY   Official radiology report(s): Ct Abdomen Pelvis W Contrast  Result Date: 09/25/2019 CLINICAL DATA:  48 year old female with history of generalized abdominal pain, nausea, vomiting and diarrhea since Friday. History of breast cancer. EXAM: CT ABDOMEN AND PELVIS WITH CONTRAST TECHNIQUE: Multidetector CT imaging of the abdomen and pelvis was performed using the standard protocol following bolus administration of intravenous contrast. CONTRAST:  130m OMNIPAQUE IOHEXOL 300 MG/ML  SOLN COMPARISON:  CT of the abdomen and pelvis 06/30/2017. FINDINGS: Lower chest: Unremarkable. Hepatobiliary: No suspicious cystic or solid hepatic lesions. No intra or extrahepatic biliary ductal dilatation. Gallbladder is normal in appearance. Pancreas: No pancreatic mass. No pancreatic ductal dilatation. No pancreatic or peripancreatic fluid collections or inflammatory changes. Spleen: Unremarkable. Adrenals/Urinary Tract: 7 mm nonobstructive  calculus in the interpolar collecting system of the left kidney. Bilateral kidneys and adrenal glands are otherwise normal in appearance. No hydroureteronephrosis. Urinary bladder is normal in appearance. Stomach/Bowel: Normal appearance of the stomach. Small bowel malrotation (normal anatomical variant) incidentally noted. No pathologic dilatation of small bowel or colon. Normal appendix. Vascular/Lymphatic: Aortic atherosclerosis, without evidence of aneurysm or dissection in the abdominal or pelvic vasculature. No lymphadenopathy noted in the abdomen or pelvis. Reproductive: Uterus is mildly enlarged and heterogeneous in appearance, suggesting the presence of  multiple small fibroids. Right ovary is unremarkable in appearance. Multiple small low-attenuation lesions in the left ovary measuring 2 cm or less in size, presumably multiple small follicles. Other: No significant volume of ascites.  No pneumoperitoneum. Musculoskeletal: Prominent vertical trabeculations in T10 vertebral body, compatible with a vertebral body hemangioma. There are no aggressive appearing lytic or blastic lesions noted in the visualized portions of the skeleton. IMPRESSION: 1. No acute findings are noted in the abdomen or pelvis to account for the patient's symptoms. 2. Small bowel malrotation (normal anatomical variant), without evidence of bowel obstruction or other acute complicating features at this time. 3. 7 mm nonobstructive calculus in the interpolar collecting system of the left kidney. No ureteral stones or findings of urinary tract obstruction are noted at this time. 4. Normal appendix. 5. Aortic atherosclerosis. Electronically Signed   By: Vinnie Langton M.D.   On: 09/25/2019 11:17    ____________________________________________   PROCEDURES  Procedure(s) performed (including Critical Care):  Procedures   ____________________________________________   INITIAL IMPRESSION / ASSESSMENT AND PLAN / ED COURSE   Melinda Holder was evaluated in Emergency Department on 09/25/2019 for the symptoms described in the history of present illness. She was evaluated in the context of the global COVID-19 pandemic, which necessitated consideration that the patient might be at risk for infection with the SARS-CoV-2 virus that causes COVID-19. Institutional protocols and algorithms that pertain to the evaluation of patients at risk for COVID-19 are in a state of rapid change based on information released by regulatory bodies including the CDC and federal and state organizations. These policies and algorithms were followed during the patient's care in the ED.    Patient is a 48 year old who presents with lower abdominal pain, reported fever although afebrile here, nausea, vomiting, diarrhea.  Given patient's tenderness on exam will get CT scan to evaluate for diverticulitis, colitis, SBO.  Will get labs to evaluate for electrolyte abnormalities.  If work-up is reassuring will consider outpatient coronavirus testing.  Work-up was reassuring with negative pregnancy test.  Labs are reviewed and unremarkable.  CT scan is negative for acute pathology except for a normal variant of small bowel rotation and a nonobstructing calculus.  Discussed with patient will get outpatient coronavirus testing.  Patient understands she should stay quarantined at home until results are back.  Patient is feeling much better at this time.  Patient is not had any vomiting or diarrhea to collect a stool sample here.  She feels comfortable with being discharged home.  We will give her a short course of Zofran to help with some of the nausea.  Patient could take Tylenol 1 g every 8 hours to help with pain.  I discussed the provisional nature of ED diagnosis, the treatment so far, the ongoing plan of care, follow up appointments and return precautions with the patient and any family or support people present. They expressed understanding and agreed with  the plan, discharged home.   ____________________________________________   FINAL CLINICAL IMPRESSION(S) / ED DIAGNOSES   Final diagnoses:  Suspected Covid-19 Virus Infection  Intractable vomiting with nausea, unspecified vomiting type  Diarrhea, unspecified type      MEDICATIONS GIVEN DURING THIS VISIT:  Medications  sodium chloride flush (NS) 0.9 % injection 3 mL (3 mLs Intravenous Given 09/25/19 1016)  acetaminophen (TYLENOL) tablet 1,000 mg (1,000 mg Oral Given 09/25/19 1044)  ketorolac (TORADOL) 30 MG/ML injection 15 mg (15 mg Intravenous Given 09/25/19 1045)  iohexol (OMNIPAQUE) 300 MG/ML solution 100 mL (  100 mLs Intravenous Contrast Given 09/25/19 1104)     ED Discharge Orders         Ordered    ondansetron (ZOFRAN) 4 MG tablet  Daily PRN     09/25/19 1210           Note:  This document was prepared using Dragon voice recognition software and may include unintentional dictation errors.   Vanessa [place], MD 09/25/19 [phone]

## 2019-09-26 LAB — NOVEL CORONAVIRUS, NAA (HOSP ORDER, SEND-OUT TO REF LAB; TAT 18-24 HRS): SARS-CoV-2, NAA: NOT DETECTED

## 2019-09-28 ENCOUNTER — Other Ambulatory Visit (HOSPITAL_COMMUNITY)
Admission: RE | Admit: 2019-09-28 | Discharge: 2019-09-28 | Disposition: A | Discharge status: Home / Self Care | Payer: BC Managed Care – PPO | Source: Ambulatory Visit | Attending: Advanced Practice Midwife | Admitting: Advanced Practice Midwife

## 2019-09-28 ENCOUNTER — Other Ambulatory Visit: Payer: Self-pay

## 2019-09-28 ENCOUNTER — Ambulatory Visit (INDEPENDENT_AMBULATORY_CARE_PROVIDER_SITE_OTHER): Payer: Self-pay | Admitting: Advanced Practice Midwife

## 2019-09-28 ENCOUNTER — Encounter: Payer: Self-pay | Admitting: Advanced Practice Midwife

## 2019-09-28 VITALS — BP 127/87 | HR 102 | Ht 62.0 in | Wt 154.0 lb

## 2019-09-28 DIAGNOSIS — Z01419 Encounter for gynecological examination (general) (routine) without abnormal findings: Secondary | ICD-10-CM | POA: Diagnosis not present

## 2019-09-28 DIAGNOSIS — Z124 Encounter for screening for malignant neoplasm of cervix: Secondary | ICD-10-CM

## 2019-09-28 DIAGNOSIS — Z113 Encounter for screening for infections with a predominantly sexual mode of transmission: Secondary | ICD-10-CM

## 2019-09-28 DIAGNOSIS — R102 Pelvic and perineal pain unspecified side: Secondary | ICD-10-CM

## 2019-09-28 NOTE — Progress Notes (Signed)
Gynecology Annual Exam  PCP: Leone Haven, MD  Chief Complaint:  Chief Complaint  Patient presents with   Gynecologic Exam    ER follow up/enlarged uterus   discuss possible surgery    H/O breast cancer, discuss hysterectomy    History of Present Illness: Patient is a 48 y.o. G2P0020 presents for annual exam. It has been almost 3 years since her last annual and given all that she has been through in the past year she decided it would be a good idea. The patient has gyn complaint today of pain with intercourse. She also has heavy and prolonged menstrual bleeding. In the past year she was diagnosed with breast cancer and had lumpectomy left breast and radiation. She tested BRCA negative.   She was seen in the ER 3 days ago on the advice of her cancer doctor due to abdominal pain. CT scan was primarily normal except for enlarged uterus likely due to multiple small fibroids and left ovary with likely multiple small follicles. She was unable to take Tamoxifen for breast cancer treatment and was told if she has a hysterectomy that there are other treatment options.   She is interested in follow up MD visit to discuss hysterectomy. We also discussed having a follow up gyn ultrasound to evaluate uterus and ovaries.   Of note- she also tested Covid negative 3 days ago.  LMP: Patient's last menstrual period was 09/07/2019 (exact date). Average Interval: regular, 28 days Duration of flow: 14 days Heavy Menses: yes Clots: no Intermenstrual Bleeding: no Postcoital Bleeding: no Dysmenorrhea: yes   The patient is not currently sexually active. She currently uses none for contraception. She admits to dyspareunia.  The patient does perform self breast exams.  There is no notable family history of breast or ovarian cancer in her family.  The patient wears seatbelts: yes.   The patient has regular exercise: she walks 1-2 miles every other day. She admits healthy diet with a "carbs  weakness". She admits adequate hydration and sleep.  The patient denies current symptoms of depression.    Review of Systems: Review of Systems  Constitutional: Negative.   HENT: Negative.   Eyes: Negative.   Respiratory: Negative.   Cardiovascular: Negative.   Gastrointestinal: Positive for abdominal pain.  Genitourinary: Negative.   Musculoskeletal: Negative.   Skin: Negative.   Neurological: Negative.   Endo/Heme/Allergies: Negative.   Psychiatric/Behavioral: Negative.     Past Medical History:  Past Medical History:  Diagnosis Date   Amblyopia of eye, right    Anemia    Asthma    childhood   Cancer ([place]) 10/27/2018   left breast   Difficult intubation 11/03/2016   DL x 2 with MAC 3 (CRNA and MD), very anterior. DL x 2 with Glidescope #3 Lo pro, bougie utilized. Easy mask.   Endometriosis    GERD (gastroesophageal reflux disease)     Past Surgical History:  Past Surgical History:  Procedure Laterality Date   ABLATION ON ENDOMETRIOSIS     BREAST BIOPSY Left 10/28/2018   INVASIVE MAMMARY CARCINOMA   BREAST EXCISIONAL BIOPSY Left 10/28/2018   BREAST LUMPECTOMY Left 11/17/2018   BREAST LUMPECTOMY Left 12/19/2018   Procedure: BREAST LUMPECTOMY;  Surgeon: Jules Husbands, MD;  Location: [facility];  Service: General;  Laterality: Left;   BREAST LUMPECTOMY WITH NEEDLE LOCALIZATION Left 11/17/2018   Procedure: BREAST LUMPECTOMY WITH NEEDLE LOCALIZATION;  Surgeon: Jules Husbands, MD;  Location: [facility];  Service: General;  Laterality: Left;   CLAVICLE SURGERY Left 10/2016   COLONOSCOPY WITH PROPOFOL N/A 06/27/2018   Procedure: COLONOSCOPY WITH PROPOFOL;  Surgeon: Lucilla Lame, MD;  Location: [facility];  Service: Endoscopy;  Laterality: N/A;   ESOPHAGOGASTRODUODENOSCOPY (EGD) WITH PROPOFOL N/A 06/27/2018   Procedure: ESOPHAGOGASTRODUODENOSCOPY (EGD) WITH PROPOFOL;  Surgeon: Lucilla Lame, MD;  Location: [place];  Service: Endoscopy;   Laterality: N/A;   EYE MUSCLE SURGERY Right    child   ORIF CLAVICULAR FRACTURE Left 11/03/2016   Procedure: OPEN REDUCTION INTERNAL FIXATION (ORIF) CLAVICULAR FRACTURE;  Surgeon: Corky Mull, MD;  Location: [facility];  Service: Orthopedics;  Laterality: Left;   removal of fibroid     SENTINEL NODE BIOPSY Left 11/17/2018   Procedure: SENTINEL NODE BIOPSY;  Surgeon: Jules Husbands, MD;  Location: [facility];  Service: General;  Laterality: Left;    Gynecologic History:  Patient's last menstrual period was 09/07/2019 (exact date). Contraception: none Last Pap: 2.5 years ago Results were:  no abnormalities  Last mammogram: 1 year ago Results were: BI-RADS IV  Obstetric History: G2P0020  Family History:  Family History  Problem Relation Age of Onset   Lupus Mother    Hypertension Mother    Lymphoma Mother 35       B cell; currently 29   Hypertension Father        currently 4   Diabetes Father    Breast cancer Paternal Aunt 59       deceased 16   Lung cancer Paternal Aunt 85       deceased 12   Brain cancer Paternal Grandfather 56       deceased 8    Social History:  Social History   Socioeconomic History   Marital status: Married    Spouse name: 0   Number of children: 0   Years of education: Not on file   Highest education level: Associate degree: academic program  Occupational History   Occupation: Librarian, academic    Comment: Education officer, museum strain: Not on file   Food insecurity    Worry: Not on file    Inability: Not on file   Transportation needs    Medical: Not on file    Non-medical: Not on file  Tobacco Use   Smoking status: Former Smoker    Packs/day: 1.00    Years: 15.00    Pack years: 15.00    Types: Cigarettes    Quit date: 02/21/2015    Years since quitting: 4.6   Smokeless tobacco: Never Used  Substance and Sexual Activity   Alcohol use: Yes    Alcohol/week: 2.0 standard drinks    Types:  2 Cans of beer per week    Comment: Occasional    Drug use: No   Sexual activity: Yes    Partners: Male    Comment: 1 female  Lifestyle   Physical activity    Days per week: Not on file    Minutes per session: Not on file   Stress: Not on file  Relationships   Social connections    Talks on phone: Not on file    Gets together: Not on file    Attends religious service: Not on file    Active member of club or organization: Not on file    Attends meetings of clubs or organizations: Not on file    Relationship status: Not on file   Intimate partner violence  Fear of current or ex partner: Not on file    Emotionally abused: Not on file    Physically abused: Not on file    Forced sexual activity: Not on file  Other Topics Concern   Not on file  Social History Narrative   Works- Engineer, 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 Authorizing Provider  hydrOXYzine (ATARAX/VISTARIL) 10 MG tablet TAKE 1 TABLET BY MOUTH EVERYDAY AT BEDTIME 04/11/19  Yes Leone Haven, MD  ibuprofen (ADVIL,MOTRIN) 200 MG tablet Take 400 mg by mouth 2 (two) times daily as needed for headache or moderate pain.    Yes [provider]  omeprazole (PRILOSEC) 20 MG capsule Take 1 capsule (20 mg total) by mouth 2 (two) times daily before a meal. Patient taking differently: Take 20 mg by mouth 2 (two) times daily as needed.  08/02/18  Yes Lucilla Lame, MD  polyethylene glycol (MIRALAX / GLYCOLAX) packet Take 17 g by mouth daily as needed for moderate constipation.   Yes [provider]  HYDROcodone-acetaminophen (NORCO/VICODIN) 5-325 MG tablet Take 1 tablet by mouth every 6 (six) hours as needed for moderate pain. Patient not taking: Reported on 09/28/2019 12/19/18   Caroleen Hamman F, MD  mupirocin ointment (BACTROBAN) 2 % Apply 1 application topically 2 (two) times daily. Patient not taking: Reported on 09/20/2019 06/02/19   Jodelle Green, FNP  ondansetron (ZOFRAN) 4 MG tablet Take 1 tablet (4 mg total) by mouth daily as needed for nausea or vomiting. Patient not taking: Reported on 09/28/2019 09/25/19 09/24/20  Vanessa [place], MD  tamoxifen (NOLVADEX) 10 MG tablet Take 1 tablet (10 mg total) by mouth daily. Patient not taking: Reported on 09/20/2019 06/21/19   Sindy Guadeloupe, MD    Physical Exam Vitals: Blood pressure 127/87, pulse (!) 102, height _0  (1.575 m), weight 154 lb (69.9 kg), last menstrual period 09/07/2019.  General: NAD HEENT: normocephalic, anicteric Thyroid: no enlargement, no palpable nodules Pulmonary: No increased work of breathing, CTAB Cardiovascular: RRR, distal pulses 2+ Breast: Breast symmetrical, no tenderness, no palpable nodules or masses, no skin or nipple retraction present, no nipple discharge.  No axillary or supraclavicular lymphadenopathy. Abdomen: NABS, soft, non-tender, non-distended.  Umbilicus without lesions.  No hepatomegaly, splenomegaly or masses palpable. No evidence of hernia  Genitourinary:  External: Normal external female genitalia.  Normal urethral meatus, normal Bartholin's and Skene's glands.    Vagina: Normal vaginal mucosa, no evidence of prolapse.    Cervix: Grossly normal in appearance, no bleeding  Uterus: Non-enlarged, mobile, normal contour.  No CMT  Adnexa: ovaries non-enlarged, no adnexal masses  Rectal: deferred  Lymphatic: no evidence of inguinal lymphadenopathy Extremities: no edema, erythema, or tenderness Neurologic: Grossly intact Psychiatric: mood appropriate, affect full  Female chaperone present for pelvic and breast  portions of the physical exam    Assessment: 48 y.o. G2P0020 routine annual exam  Plan: Problem List Items Addressed This Visit    None    Visit Diagnoses    Well woman  exam with routine gynecological exam    -  Primary   Relevant Orders   Hepatitis B surface antibody,qualitative   HIV Antibody (routine testing w rflx)   RPR  Qual   Cytology - PAP   Screen for sexually transmitted diseases       Relevant Orders   Hepatitis B surface antibody,qualitative   HIV Antibody (routine testing w rflx)   RPR Qual   Cytology - PAP   Cervical cancer screening       Relevant Orders   Cytology - PAP   Pelvic pain       Relevant Orders   US PELVIS TRANSVAGINAL NON-OB (TV ONLY)      1) Mammogram - recommend yearly screening mammogram.  Mammogram is scheduled for later this month   2) STI screening  was offered and accepted  3) ASCCP guidelines and rationale discussed.  Patient opts for every 3 years screening interval  4) Contraception - the patient is currently using  none.  She is happy with her current form of contraception and plans to continue  5) Colonoscopy: up to date: normal screening done last year -- Screening recommended starting at age 48 for average risk individuals, age 16 for individuals deemed at increased risk (including African Americans) and recommended to continue until age 49.  For patient age 57-85 individualized approach is recommended.  Gold standard screening is via colonoscopy, Cologuard screening is an acceptable alternative for patient unwilling or unable to undergo colonoscopy.  "Colorectal cancer screening for average?risk adults: 2018 guideline update from the American Cancer Society"CA: A Cancer Journal for Clinicians: May 26, 2017   6) Routine healthcare maintenance including cholesterol, diabetes screening discussed managed by PCP   7) Dyspareunia: Hyaluronic acid vaginal gel or other lubricant  8) Return in about 1 week (around 10/05/2019) for MD f/u for gyn u/s fibroids and hysterectomy consult.   Rod Can, [facility] 09/28/2019, 3:36 PM

## 2019-09-28 NOTE — Patient Instructions (Signed)
Health Maintenance, Female Adopting a healthy lifestyle and getting preventive care are important in promoting health and wellness. Ask your health care provider about:  The right schedule for you to have regular tests and exams.  Things you can do on your own to prevent diseases and keep yourself healthy. What should I know about diet, weight, and exercise? Eat a healthy diet   Eat a diet that includes plenty of vegetables, fruits, low-fat dairy products, and lean protein.  Do not eat a lot of foods that are high in solid fats, added sugars, or sodium. Maintain a healthy weight Body mass index (BMI) is used to identify weight problems. It estimates body fat based on height and weight. Your health care provider can help determine your BMI and help you achieve or maintain a healthy weight. Get regular exercise Get regular exercise. This is one of the most important things you can do for your health. Most adults should:  Exercise for at least 150 minutes each week. The exercise should increase your heart rate and make you sweat (moderate-intensity exercise).  Do strengthening exercises at least twice a week. This is in addition to the moderate-intensity exercise.  Spend less time sitting. Even light physical activity can be beneficial. Watch cholesterol and blood lipids Have your blood tested for lipids and cholesterol at 47 years of age, then have this test every 5 years. Have your cholesterol levels checked more often if:  Your lipid or cholesterol levels are high.  You are older than 47 years of age.  You are at high risk for heart disease. What should I know about cancer screening? Depending on your health history and family history, you may need to have cancer screening at various ages. This may include screening for:  Breast cancer.  Cervical cancer.  Colorectal cancer.  Skin cancer.  Lung cancer. What should I know about heart disease, diabetes, and high blood  pressure? Blood pressure and heart disease  High blood pressure causes heart disease and increases the risk of stroke. This is more likely to develop in people who have high blood pressure readings, are of African descent, or are overweight.  Have your blood pressure checked: ? Every 3-5 years if you are 18-39 years of age. ? Every year if you are 40 years old or older. Diabetes Have regular diabetes screenings. This checks your fasting blood sugar level. Have the screening done:  Once every three years after age 40 if you are at a normal weight and have a low risk for diabetes.  More often and at a younger age if you are overweight or have a high risk for diabetes. What should I know about preventing infection? Hepatitis B If you have a higher risk for hepatitis B, you should be screened for this virus. Talk with your health care provider to find out if you are at risk for hepatitis B infection. Hepatitis C Testing is recommended for:  Everyone born from 1945 through 1965.  Anyone with known risk factors for hepatitis C. Sexually transmitted infections (STIs)  Get screened for STIs, including gonorrhea and chlamydia, if: ? You are sexually active and are younger than 47 years of age. ? You are older than 47 years of age and your health care provider tells you that you are at risk for this type of infection. ? Your sexual activity has changed since you were last screened, and you are at increased risk for chlamydia or gonorrhea. Ask your health care provider if   you are at risk.  Ask your health care provider about whether you are at high risk for HIV. Your health care provider may recommend a prescription medicine to help prevent HIV infection. If you choose to take medicine to prevent HIV, you should first get tested for HIV. You should then be tested every 3 months for as long as you are taking the medicine. Pregnancy  If you are about to stop having your period (premenopausal) and  you may become pregnant, seek counseling before you get pregnant.  Take 400 to 800 micrograms (mcg) of folic acid every day if you become pregnant.  Ask for birth control (contraception) if you want to prevent pregnancy. Osteoporosis and menopause Osteoporosis is a disease in which the bones lose minerals and strength with aging. This can result in bone fractures. If you are 65 years old or older, or if you are at risk for osteoporosis and fractures, ask your health care provider if you should:  Be screened for bone loss.  Take a calcium or vitamin D supplement to lower your risk of fractures.  Be given hormone replacement therapy (HRT) to treat symptoms of menopause. Follow these instructions at home: Lifestyle  Do not use any products that contain nicotine or tobacco, such as cigarettes, e-cigarettes, and chewing tobacco. If you need help quitting, ask your health care provider.  Do not use street drugs.  Do not share needles.  Ask your health care provider for help if you need support or information about quitting drugs. Alcohol use  Do not drink alcohol if: ? Your health care provider tells you not to drink. ? You are pregnant, may be pregnant, or are planning to become pregnant.  If you drink alcohol: ? Limit how much you use to 0-1 drink a day. ? Limit intake if you are breastfeeding.  Be aware of how much alcohol is in your drink. In the [place] one drink equals one 12 oz bottle of beer (355 mL), one 5 oz glass of wine (148 mL), or one 1 oz glass of hard liquor (44 mL). General instructions  Schedule regular health, dental, and eye exams.  Stay current with your vaccines.  Tell your health care provider if: ? You often feel depressed. ? You have ever been abused or do not feel safe at home. Summary  Adopting a healthy lifestyle and getting preventive care are important in promoting health and wellness.  Follow your health care provider's instructions about healthy  diet, exercising, and getting tested or screened for diseases.  Follow your health care provider's instructions on monitoring your cholesterol and blood pressure. This information is not intended to replace advice given to you by your health care provider. Make sure you discuss any questions you have with your health care provider. Document Released: 06/29/2011 Document Revised: 12/07/2018 Document Reviewed: 12/07/2018 Elsevier Patient Education  2020 [employer]  

## 2019-09-29 LAB — HIV ANTIBODY (ROUTINE TESTING W REFLEX): HIV Screen 4th Generation wRfx: NONREACTIVE

## 2019-09-29 LAB — HEPATITIS B SURFACE ANTIBODY,QUALITATIVE: Hep B Surface Ab, Qual: NONREACTIVE

## 2019-09-29 LAB — RPR QUALITATIVE: RPR Ser Ql: NONREACTIVE

## 2019-10-02 LAB — CYTOLOGY - PAP
Chlamydia: NEGATIVE
Diagnosis: NEGATIVE
High risk HPV: NEGATIVE
Neisseria Gonorrhea: NEGATIVE
Trichomonas: NEGATIVE

## 2019-10-24 ENCOUNTER — Ambulatory Visit (INDEPENDENT_AMBULATORY_CARE_PROVIDER_SITE_OTHER): Payer: BC Managed Care – PPO | Admitting: Obstetrics and Gynecology

## 2019-10-24 ENCOUNTER — Ambulatory Visit
Admission: RE | Admit: 2019-10-24 | Discharge: 2019-10-24 | Disposition: A | Discharge status: Home / Self Care | Payer: BC Managed Care – PPO | Source: Ambulatory Visit | Attending: Radiation Oncology | Admitting: Radiation Oncology

## 2019-10-24 ENCOUNTER — Other Ambulatory Visit (HOSPITAL_COMMUNITY)
Admission: RE | Admit: 2019-10-24 | Discharge: 2019-10-24 | Disposition: A | Discharge status: Home / Self Care | Payer: BC Managed Care – PPO | Source: Ambulatory Visit | Attending: Obstetrics and Gynecology | Admitting: Obstetrics and Gynecology

## 2019-10-24 ENCOUNTER — Encounter: Payer: Self-pay | Admitting: Obstetrics and Gynecology

## 2019-10-24 ENCOUNTER — Other Ambulatory Visit: Payer: Self-pay

## 2019-10-24 ENCOUNTER — Ambulatory Visit (INDEPENDENT_AMBULATORY_CARE_PROVIDER_SITE_OTHER): Payer: BC Managed Care – PPO

## 2019-10-24 VITALS — BP 122/74 | Ht 62.0 in | Wt 154.0 lb

## 2019-10-24 DIAGNOSIS — N921 Excessive and frequent menstruation with irregular cycle: Secondary | ICD-10-CM | POA: Insufficient documentation

## 2019-10-24 DIAGNOSIS — D251 Intramural leiomyoma of uterus: Secondary | ICD-10-CM | POA: Diagnosis not present

## 2019-10-24 DIAGNOSIS — C50112 Malignant neoplasm of central portion of left female breast: Secondary | ICD-10-CM | POA: Insufficient documentation

## 2019-10-24 DIAGNOSIS — R102 Pelvic and perineal pain: Secondary | ICD-10-CM

## 2019-10-24 DIAGNOSIS — C50912 Malignant neoplasm of unspecified site of left female breast: Secondary | ICD-10-CM

## 2019-10-24 DIAGNOSIS — Z853 Personal history of malignant neoplasm of breast: Secondary | ICD-10-CM | POA: Diagnosis not present

## 2019-10-24 DIAGNOSIS — Z23 Encounter for immunization: Secondary | ICD-10-CM | POA: Diagnosis not present

## 2019-10-24 DIAGNOSIS — Z170 Estrogen receptor positive status [ER+]: Secondary | ICD-10-CM | POA: Diagnosis not present

## 2019-10-24 DIAGNOSIS — R922 Inconclusive mammogram: Secondary | ICD-10-CM | POA: Diagnosis not present

## 2019-10-24 DIAGNOSIS — N920 Excessive and frequent menstruation with regular cycle: Secondary | ICD-10-CM | POA: Diagnosis not present

## 2019-10-24 HISTORY — DX: Personal history of irradiation: Z92.3

## 2019-10-24 NOTE — Progress Notes (Signed)
Gynecology Ultrasound Follow Up   Chief Complaint  Patient presents with   Follow-up    u/s follow up   Fibroids  Discuss surgery Heavy menses   History of Present Illness: Patient is a 48 y.o. female who presents today for ultrasound evaluation of the above .  Ultrasound demonstrates the following findings Adnexa: no masses seen  Uterus: anteverted with endometrial stripe  2.6 mm Additional: anterior fibroid measuring 2.8 x 2.1 x 3.3 cm (intramural), posterior lesion measuring 5 x 3.3 x 4.7 cm (possible fibroid versus adenomyoma versus adenomyosis).   She very recently had a normal pap smear.   LMP: Patient's last menstrual period was 10/06/2019.  Average Interval: regular, 28 days Duration of flow: 14 days Heavy Menses: yes Clots: no Intermenstrual Bleeding: no Postcoital Bleeding: no Dysmenorrhea: yes  Past Medical History:  Diagnosis Date   Amblyopia of eye, right    Anemia    Asthma    childhood   Breast cancer ([place]) 2019   Cancer ([place]) 10/27/2018   left breast   Difficult intubation 11/03/2016   DL x 2 with MAC 3 (CRNA and MD), very anterior. DL x 2 with Glidescope #3 Lo pro, bougie utilized. Easy mask.   Endometriosis    GERD (gastroesophageal reflux disease)    Personal history of radiation therapy     Past Surgical History:  Procedure Laterality Date   ABLATION ON ENDOMETRIOSIS     BREAST BIOPSY Left 10/28/2018   INVASIVE MAMMARY CARCINOMA   BREAST EXCISIONAL BIOPSY Left 10/28/2018   BREAST LUMPECTOMY Left 11/17/2018   BREAST LUMPECTOMY Left 12/19/2018   re-excision Procedure: BREAST LUMPECTOMY;  Surgeon: Jules Husbands, MD;  Location: [facility];  Service: General;  Laterality: Left;   BREAST LUMPECTOMY WITH NEEDLE LOCALIZATION Left 11/17/2018   Procedure: BREAST LUMPECTOMY WITH NEEDLE LOCALIZATION;  Surgeon: Jules Husbands, MD;  Location: [facility];  Service: General;  Laterality: Left;   CLAVICLE SURGERY Left 10/2016    COLONOSCOPY WITH PROPOFOL N/A 06/27/2018   Procedure: COLONOSCOPY WITH PROPOFOL;  Surgeon: Lucilla Lame, MD;  Location: [facility];  Service: Endoscopy;  Laterality: N/A;   ESOPHAGOGASTRODUODENOSCOPY (EGD) WITH PROPOFOL N/A 06/27/2018   Procedure: ESOPHAGOGASTRODUODENOSCOPY (EGD) WITH PROPOFOL;  Surgeon: Lucilla Lame, MD;  Location: [facility];  Service: Endoscopy;  Laterality: N/A;   EYE MUSCLE SURGERY Right    child   ORIF CLAVICULAR FRACTURE Left 11/03/2016   Procedure: OPEN REDUCTION INTERNAL FIXATION (ORIF) CLAVICULAR FRACTURE;  Surgeon: Corky Mull, MD;  Location: [facility];  Service: Orthopedics;  Laterality: Left;   removal of fibroid     SENTINEL NODE BIOPSY Left 11/17/2018   Procedure: SENTINEL NODE BIOPSY;  Surgeon: Jules Husbands, MD;  Location: [facility];  Service: General;  Laterality: Left;     Family History  Problem Relation Age of Onset   Lupus Mother    Hypertension Mother    Lymphoma Mother 39       B cell; currently 70   Hypertension Father        currently 36   Diabetes Father    Breast cancer Paternal Aunt 89       deceased 61   Lung cancer Paternal Aunt 68       deceased 60   Brain cancer Paternal Grandfather 70       deceased 51    Social History   Socioeconomic History   Marital status: Married    Spouse name: 0   Number  of children: 0   Years of education: Not on file   Highest education level: Associate degree: academic program  Occupational History   Occupation: Librarian, academic    Comment: Education officer, museum strain: Not on file   Food insecurity    Worry: Not on file    Inability: Not on file   Transportation needs    Medical: Not on file    Non-medical: Not on file  Tobacco Use   Smoking status: Former Smoker    Packs/day: 1.00    Years: 15.00    Pack years: 15.00    Types: Cigarettes    Quit date: 02/21/2015    Years since quitting: 4.6   Smokeless tobacco: Never  Used  Substance and Sexual Activity   Alcohol use: Yes    Alcohol/week: 2.0 standard drinks    Types: 2 Cans of beer per week    Comment: Occasional    Drug use: No   Sexual activity: Yes    Partners: Male    Comment: 1 female  Lifestyle   Physical activity    Days per week: Not on file    Minutes per session: Not on file   Stress: Not on file  Relationships   Social connections    Talks on phone: Not on file    Gets together: Not on file    Attends religious service: Not on file    Active member of club or organization: Not on file    Attends meetings of clubs or organizations: Not on file    Relationship status: Not on file   Intimate partner violence    Fear of current or ex partner: Not on file    Emotionally abused: Not on file    Physically abused: Not on file    Forced sexual activity: Not on file  Other Topics Concern   Not on file  Social History Narrative   Works- Engineer, 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 Authorizing Provider  hydrOXYzine (ATARAX/VISTARIL) 10 MG tablet TAKE 1 TABLET BY MOUTH EVERYDAY AT BEDTIME 04/11/19   Leone Haven, MD  ibuprofen (ADVIL,MOTRIN) 200 MG tablet Take 400 mg by mouth 2 (two) times daily as needed for headache or moderate pain.     [provider]  omeprazole (PRILOSEC) 20 MG capsule Take 1 capsule (20 mg total) by mouth 2 (two) times daily before a meal. Patient taking differently: Take 20 mg by mouth 2 (two) times daily as needed.  08/02/18   Lucilla Lame, MD  polyethylene glycol (MIRALAX / Floria Raveling) packet Take 17 g by mouth daily as needed for moderate constipation.    [provider]    Physical Exam BP 122/74    Ht _0  (1.575 m)    Wt 154 lb (69.9 kg)    LMP 10/06/2019    BMI 28.17 kg/m   Physical  Exam Constitutional:      General: She is not in acute distress.    Appearance: Normal appearance. She is well-developed.  Genitourinary:     Pelvic exam was performed with patient in the lithotomy position.     Vulva, inguinal canal, urethra, bladder, vagina and cervix normal.  Uterus is enlarged, irregular and mobile.  HENT:     Head: Normocephalic and atraumatic.  Eyes:     General: No scleral icterus.    Conjunctiva/sclera: Conjunctivae normal.  Neck:     Musculoskeletal: Normal range of motion and neck supple.  Cardiovascular:     Rate and Rhythm: Normal rate and regular rhythm.     Heart sounds: No murmur. No friction rub. No gallop.   Pulmonary:     Effort: Pulmonary effort is normal. No respiratory distress.     Breath sounds: Normal breath sounds. No wheezing or rales.  Abdominal:     General: Bowel sounds are normal. There is no distension.     Palpations: Abdomen is soft. There is no mass.     Tenderness: There is no abdominal tenderness. There is no guarding or rebound.  Musculoskeletal: Normal range of motion.  Neurological:     General: No focal deficit present.     Mental Status: She is alert and oriented to person, place, and time.     Cranial Nerves: No cranial nerve deficit.  Skin:    General: Skin is warm and dry.     Findings: No erythema.  Psychiatric:        Mood and Affect: Mood normal.        Behavior: Behavior normal.        Judgment: Judgment normal.    Endometrial Biopsy After discussion with the patient regarding her abnormal uterine bleeding I recommended that she proceed with an endometrial biopsy for further diagnosis. The risks, benefits, alternatives, and indications for an endometrial biopsy were discussed with the patient in detail. She understood the risks including infection, bleeding, cervical laceration and uterine perforation.  Verbal consent was obtained.   PROCEDURE NOTE:  Pipelle endometrial biopsy was performed using aseptic  technique with iodine preparation.  The uterus was sounded to a length of 8 cm.  Adequate sampling was obtained with minimal blood loss.  The patient tolerated the procedure well.  Disposition will be pending pathology.  Imaging Results US Pelvis Transvaginal Non-ob (tv Only)  Result Date: 10/24/2019 Patient Name: Melinda Holder DOB: 16-Dec-1971 MRN: [id number] ULTRASOUND REPORT Location: [facility] Date of Service: 10/24/2019 Indications:Fibroids Findings: The uterus is anteverted and measures 8.5 x 6.0 x 6.1 cm. Echo texture is heterogenous with evidence of focal masses. Within the uterus are multiple suspected fibroids measuring: Fibroid 1: 28.6 x 21.3 x 32.9 mm. Intramural The posterior uterus is heterogeneous with multiple small cysts. Questionable adenomyosis versus adenomyoma versus an ill-defined fibroid. This area measures 50 x 33 x 47 mm. The Endometrium measures 2.6 mm. Right Ovary is not visualized. Left Ovary measures 2.4 x 1.7 x 2.0 cm. It is normal in appearance. Survey of the adnexa demonstrates no adnexal masses. There is no free fluid in the cul de sac. Impression: 1. There is one intramural fibroid seen in the anterior uterus. 2. There posterior uterus is heterogeneous with small cystic areas. This may represent adenomyosis versus adenomyoma versus fibroid. 3. Thin endometrium. 4. The right ovary is not visible. 5. Normal appearing left ovary. Gweneth Dimitri, RT The ultrasound images and findings were reviewed by me and I agree with the above report. Prentice Docker, MD, [facility], [facility] 10/24/2019 2:27 PM       Assessment: 48 y.o. G2P0020  1. Menorrhagia with irregular cycle   2. Need for immunization against influenza   3. Malignant neoplasm of left breast in  female, estrogen receptor positive, unspecified site of breast ([place])   4. Intramural leiomyoma of uterus      Plan: Problem List Items Addressed This Visit      Other   Malignant  neoplasm of left female breast ([place])    Other Visit Diagnoses    Menorrhagia with irregular cycle    -  Primary   Relevant Orders   Surgical pathology   Need for immunization against influenza       Relevant Orders   Flu Vaccine QUAD 36+ mos IM (Completed)   Intramural leiomyoma of uterus         Endometrial biopsy done today. The patient has had previous discussions around control of her menses. Given her inability to tolerate estrogen-suppression medication, and her menstrual issues, she would like to have a hysterectomy with removal of her ovaries.  Discussed in detail.  Based on available other options, this seems like a reasonable request.  Will schedule, as long as endometrial biopsy is normal.   20 minutes spent in face to face discussion with > 50% spent in counseling,management, and coordination of care of her menorrhagia with irregular cycle and fibroid uterus.   Prentice Docker, MD, [facility], [facility] 10/25/2019 10:52 AM

## 2019-10-25 ENCOUNTER — Encounter: Payer: Self-pay | Admitting: Obstetrics and Gynecology

## 2019-10-26 LAB — SURGICAL PATHOLOGY

## 2019-10-30 ENCOUNTER — Telehealth: Payer: Self-pay | Admitting: Obstetrics and Gynecology

## 2019-10-30 NOTE — Telephone Encounter (Signed)
Contacted patient to schedule surgery. Per patient, should would like to schedule in February or March, and would like to call back in January.

## 2019-10-30 NOTE — Telephone Encounter (Signed)
-----   Message from Will Bonnet, MD sent at 10/25/2019 10:53 AM EDT ----- Regarding: Schedule Surgery Surgery Booking Request Patient Full Name:  Melinda Holder  MRN: [id number]  DOB: Jan 18, 1971  Surgeon: Prentice Docker, MD  Requested Surgery Date and Time: TBD Primary Diagnosis AND Code:  1. Menorrhagia with irregular cycle (N92.1) 2. Malignant neoplasm of left breast in female, estrogen receptor positive, unspecified site of breast ([place]) (C50.912, Z17.0) 3. Intramural leiomyoma of uterus (D25.1)  Secondary Diagnosis and Code:  Surgical Procedure: Total laparoscopic hysterectomy, bilateral salpingo-oophorectomy, cystoscoyp L&D Notification: No Admission Status: same day surgery Length of Surgery: 2 hours Special Case Needs: No H&P: Yes Phone Interview???:  No Interpreter: No Language:  Medical Clearance:  No Special Scheduling Instructions: per patient, may want to be next year Any known health/anesthesia issues, diabetes, sleep apnea, latex allergy, defibrillator/pacemaker?: yes.  History indicates possible difficulty with intubation Acuity: P3   (P1 highest, P2 delay may cause harm, P3 low, elective gyn, P4 lowest)

## 2020-01-17 ENCOUNTER — Encounter: Payer: Self-pay | Admitting: *Deleted

## 2020-01-18 ENCOUNTER — Ambulatory Visit
Admission: EM | Admit: 2020-01-18 | Discharge: 2020-01-18 | Disposition: A | Discharge status: Home / Self Care | Payer: BC Managed Care – PPO | Attending: Emergency Medicine | Admitting: Emergency Medicine

## 2020-01-18 ENCOUNTER — Other Ambulatory Visit: Payer: Self-pay

## 2020-01-18 DIAGNOSIS — G43809 Other migraine, not intractable, without status migrainosus: Secondary | ICD-10-CM

## 2020-01-18 HISTORY — DX: Migraine, unspecified, not intractable, without status migrainosus: G43.909

## 2020-01-18 MED ORDER — PROMETHAZINE HCL 25 MG/ML IJ SOLN
12.5000 mg | Freq: Once | INTRAMUSCULAR | Status: AC
  Administered 2020-01-18: 12.5 mg via INTRAMUSCULAR

## 2020-01-18 MED ORDER — KETOROLAC TROMETHAMINE 60 MG/2ML IM SOLN
60.0000 mg | Freq: Once | INTRAMUSCULAR | Status: AC
  Administered 2020-01-18: 60 mg via INTRAMUSCULAR

## 2020-01-18 NOTE — ED Triage Notes (Signed)
Pt presents with c/o migraine headache that started yesterday. She does have a history of migraines. She has taken several OTC medications with no relief. She does not currently have any rx meds for migraine treatment. Pt denies any other symptoms such as nasal congestion, cough, fever/chills.

## 2020-01-18 NOTE — Discharge Instructions (Signed)
Rest. Drink plenty of fluids.  ° °Follow up with your primary care physician this week as needed. Return to Urgent care for new or worsening concerns.  ° °

## 2020-01-18 NOTE — ED Provider Notes (Signed)
[facility] ____________________________________________  Time seen: Approximately 2:37 PM  I have reviewed the triage vital signs and the nursing notes.   HISTORY  Chief Complaint Migraine  HPI Melinda Holder is a 49 y.o. female presenting for evaluation of headache.  Patient reports feels consistent with previous migraines.  Patient reports headache started yesterday morning and has gradually increased.  Reports gradual onset of headache.  No abrupt onset of headache.  States headache is probably to forehead and described as throbbing pain.  Denies pain radiation, syncope, near-syncope, weakness, paresthesias, vision changes.  Has had some nausea as well as a light sensitivity.  No vomiting.  States stress may be a trigger.  States this feels consistent with her previous migraines.  Did try over-the-counter Advil and Goody's powders without resolution.  Has not take anything for the headache since around 7 AM this morning.  Has continued to eat and drink well.  Denies recent cough, fevers, sore throat, chest pain, shortness of breath, weakness, unilateral weakness or paresthesias.  Denies changes in taste or smell.  Reports otherwise been doing well.   Leone Haven, MD: PCP    Past Medical History:  Diagnosis Date  . Amblyopia of eye, right   . Anemia   . Asthma    childhood  . Breast cancer ([place]) 2019  . Cancer ([place]) 10/27/2018   left breast  . Difficult intubation 11/03/2016   DL x 2 with MAC 3 (CRNA and MD), very anterior. DL x 2 with Glidescope #3 Lo pro, bougie utilized. Easy mask.  . Endometriosis   . GERD (gastroesophageal reflux disease)   . Migraine   . Personal history of radiation therapy     Patient Active Problem List   Diagnosis Date Noted  . Need for prophylactic vaccination and inoculation against influenza 09/20/2019  . Goals of care, counseling/discussion 01/05/2019  . 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[place])   .  Malignant neoplasm of left female breast ([place]) 11/03/2018  . Acute gastritis without hemorrhage   . Acute duodenitis   . Other specified diseases of intestine   . Elevated glucose 06/07/2018  . Enteritis 06/30/2017  . Malrotation of intestine   . Ankle swelling, right 05/20/2017  . Iron deficiency anemia 05/20/2017  . Constipation 05/20/2017  . Encounter for general adult medical examination with abnormal findings 02/27/2016  . Pruritus 09/03/2015  . Overweight (BMI 25.0-29.9) 09/03/2015  . Adnexal pain 11/18/2012    Past Surgical History:  Procedure Laterality Date  . ABLATION ON ENDOMETRIOSIS    . BREAST BIOPSY Left 10/28/2018   INVASIVE MAMMARY CARCINOMA  . BREAST EXCISIONAL BIOPSY Left 10/28/2018  . BREAST LUMPECTOMY Left 11/17/2018  . BREAST LUMPECTOMY Left 12/19/2018   re-excision Procedure: BREAST LUMPECTOMY;  Surgeon: Jules Husbands, MD;  Location: [facility];  Service: General;  Laterality: Left;  . BREAST LUMPECTOMY WITH NEEDLE LOCALIZATION Left 11/17/2018   Procedure: BREAST LUMPECTOMY WITH NEEDLE LOCALIZATION;  Surgeon: Jules Husbands, MD;  Location: [facility];  Service: General;  Laterality: Left;  . CLAVICLE SURGERY Left 10/2016  . COLONOSCOPY WITH PROPOFOL N/A 06/27/2018   Procedure: COLONOSCOPY WITH PROPOFOL;  Surgeon: Lucilla Lame, MD;  Location: [facility];  Service: Endoscopy;  Laterality: N/A;  . ESOPHAGOGASTRODUODENOSCOPY (EGD) WITH PROPOFOL N/A 06/27/2018   Procedure: ESOPHAGOGASTRODUODENOSCOPY (EGD) WITH PROPOFOL;  Surgeon: Lucilla Lame, MD;  Location: [facility];  Service: Endoscopy;  Laterality: N/A;  . EYE MUSCLE SURGERY Right    child  .  ORIF CLAVICULAR FRACTURE Left 11/03/2016   Procedure: OPEN REDUCTION INTERNAL FIXATION (ORIF) CLAVICULAR FRACTURE;  Surgeon: Corky Mull, MD;  Location: [facility];  Service: Orthopedics;  Laterality: Left;  . removal of fibroid    . SENTINEL NODE BIOPSY Left 11/17/2018   Procedure: SENTINEL NODE BIOPSY;   Surgeon: Jules Husbands, MD;  Location: [facility];  Service: General;  Laterality: Left;      Current Facility-Administered Medications:  .  ketorolac (TORADOL) injection 60 mg, 60 mg, Intramuscular, Once, Marylene Land, NP .  promethazine (PHENERGAN) injection 12.5 mg, 12.5 mg, Intramuscular, Once, Marylene Land, NP  Current Outpatient Medications:  .  hydrOXYzine (ATARAX/VISTARIL) 10 MG tablet, TAKE 1 TABLET BY MOUTH EVERYDAY AT BEDTIME, Disp: 90 tablet, Rfl: 2 .  ibuprofen (ADVIL,MOTRIN) 200 MG tablet, Take 400 mg by mouth 2 (two) times daily as needed for headache or moderate pain. , Disp: , Rfl:  .  omeprazole (PRILOSEC) 20 MG capsule, Take 1 capsule (20 mg total) by mouth 2 (two) times daily before a meal. (Patient taking differently: Take 20 mg by mouth 2 (two) times daily as needed. ), Disp: 60 capsule, Rfl: 1 .  polyethylene glycol (MIRALAX / GLYCOLAX) packet, Take 17 g by mouth daily as needed for moderate constipation., Disp: , Rfl:   Allergies Patient has no known allergies.  Family History  Problem Relation Age of Onset  . Lupus Mother   . Hypertension Mother   . Lymphoma Mother 30       B cell; currently 30  . Hypertension Father        currently 43  . Diabetes Father   . Breast cancer Paternal Aunt 32       deceased 36  . Lung cancer Paternal Aunt 28       deceased 38  . Brain cancer Paternal Grandfather 65       deceased 68    Social History Social History   Tobacco Use  . Smoking status: Former Smoker    Packs/day: 1.00    Years: 15.00    Pack years: 15.00    Types: Cigarettes    Quit date: 02/21/2015    Years since quitting: 4.9  . Smokeless tobacco: Never Used  Substance Use Topics  . Alcohol use: Yes    Alcohol/week: 2.0 standard drinks    Types: 2 Cans of beer per week    Comment: Occasional   . Drug use: No    Review of Systems Constitutional: No fever Eyes: No visual changes. ENT: No sore throat. Cardiovascular: Denies chest  pain. Respiratory: Denies shortness of breath. Gastrointestinal: No abdominal pain.  Some intermittent nausea, no vomiting.   Musculoskeletal: Negative for back pain. Skin: Negative for rash. Neurological: Positive for headaches. Negative for focal weakness or numbness.    ____________________________________________   PHYSICAL EXAM:  VITAL SIGNS: ED Triage Vitals  Enc Vitals Group     BP 01/18/20 1415 127/83     Pulse Rate 01/18/20 1415 77     Resp --      Temp 01/18/20 1415 98.2 F (36.8 C)     Temp Source 01/18/20 1415 Oral     SpO2 01/18/20 1415 99 %     Weight 01/18/20 1413 155 lb (70.3 kg)     Height 01/18/20 1413 _0  (1.575 m)     Head Circumference --      Peak Flow --      Pain Score 01/18/20 1413 9  Pain Loc --      Pain Edu? --      Excl. in [place]? --     Constitutional: Alert and oriented. Well appearing and in no acute distress. Eyes: Conjunctivae are normal. PERRL. EOMI. ENT      Head: Normocephalic and atraumatic.      Nose: No congestion.      Mouth/Throat: Mucous membranes are moist.Oropharynx non-erythematous. Cardiovascular: Normal rate, regular rhythm. Grossly normal heart sounds.  Good peripheral circulation. Respiratory: Normal respiratory effort without tachypnea nor retractions. Breath sounds are clear and equal bilaterally. No wheezes, rales, rhonchi. Musculoskeletal: Steady gait. Neurologic:  Normal speech and language. No gross focal neurologic deficits are appreciated. Speech is normal. No gait instability.  Negative pronator drift.  No ataxia.  No paresthesias.  No asymmetry. Skin:  Skin is warm, dry and intact. No rash noted. Psychiatric: Mood and affect are normal. Speech and behavior are normal. Patient exhibits appropriate insight and judgment   ___________________________________________   LABS (all labs ordered are listed, but only abnormal results are displayed)  Labs Reviewed - No data to  display ____________________________________________  PROCEDURES Procedures    INITIAL IMPRESSION / ASSESSMENT AND PLAN / ED COURSE  Pertinent labs & imaging results that were available during my care of the patient were reviewed by me and considered in my medical decision making (see chart for details).  Overall well-appearing patient.  No acute distress.  Presenting for evaluation of migraine headache.  No focal neurological deficits.  60 mg IM Toradol and 12.5 mg IM Phenergan given once.  Encourage rest, fluids, supportive care monitor.  Discussed follow-up and return parameters. Discussed follow up with Primary care physician this week. Discussed follow up and return parameters including no resolution or any worsening concerns. Patient verbalized understanding and agreed to plan.   ____________________________________________   FINAL CLINICAL IMPRESSION(S) / ED DIAGNOSES  Final diagnoses:  Other migraine without status migrainosus, not intractable     ED Discharge Orders    None       Note: This dictation was prepared with Dragon dictation along with smaller phrase technology. Any transcriptional errors that result from this process are unintentional.         Marylene Land, NP 01/18/20 1443

## 2020-01-25 ENCOUNTER — Telehealth: Payer: Self-pay | Admitting: Obstetrics and Gynecology

## 2020-01-25 NOTE — Telephone Encounter (Signed)
Lmtrc

## 2020-01-29 ENCOUNTER — Other Ambulatory Visit: Payer: Self-pay | Admitting: Family Medicine

## 2020-03-20 ENCOUNTER — Ambulatory Visit: Payer: BC Managed Care – PPO | Admitting: Radiation Oncology

## 2020-03-21 ENCOUNTER — Inpatient Hospital Stay (HOSPITAL_BASED_OUTPATIENT_CLINIC_OR_DEPARTMENT_OTHER): Payer: BC Managed Care – PPO | Admitting: Oncology

## 2020-03-21 ENCOUNTER — Other Ambulatory Visit: Payer: Self-pay

## 2020-03-21 ENCOUNTER — Encounter: Payer: Self-pay | Admitting: Oncology

## 2020-03-21 ENCOUNTER — Inpatient Hospital Stay: Payer: BC Managed Care – PPO | Attending: Oncology

## 2020-03-21 VITALS — BP 125/80 | HR 77 | Temp 98.4°F | Resp 16 | Wt 152.8 lb

## 2020-03-21 DIAGNOSIS — Z801 Family history of malignant neoplasm of trachea, bronchus and lung: Secondary | ICD-10-CM | POA: Insufficient documentation

## 2020-03-21 DIAGNOSIS — Z87891 Personal history of nicotine dependence: Secondary | ICD-10-CM | POA: Diagnosis not present

## 2020-03-21 DIAGNOSIS — Z833 Family history of diabetes mellitus: Secondary | ICD-10-CM | POA: Diagnosis not present

## 2020-03-21 DIAGNOSIS — D473 Essential (hemorrhagic) thrombocythemia: Secondary | ICD-10-CM | POA: Diagnosis not present

## 2020-03-21 DIAGNOSIS — Z170 Estrogen receptor positive status [ER+]: Secondary | ICD-10-CM | POA: Insufficient documentation

## 2020-03-21 DIAGNOSIS — R7989 Other specified abnormal findings of blood chemistry: Secondary | ICD-10-CM | POA: Diagnosis not present

## 2020-03-21 DIAGNOSIS — Z08 Encounter for follow-up examination after completed treatment for malignant neoplasm: Secondary | ICD-10-CM

## 2020-03-21 DIAGNOSIS — Z791 Long term (current) use of non-steroidal anti-inflammatories (NSAID): Secondary | ICD-10-CM | POA: Diagnosis not present

## 2020-03-21 DIAGNOSIS — Z8249 Family history of ischemic heart disease and other diseases of the circulatory system: Secondary | ICD-10-CM | POA: Diagnosis not present

## 2020-03-21 DIAGNOSIS — Z808 Family history of malignant neoplasm of other organs or systems: Secondary | ICD-10-CM | POA: Insufficient documentation

## 2020-03-21 DIAGNOSIS — Z807 Family history of other malignant neoplasms of lymphoid, hematopoietic and related tissues: Secondary | ICD-10-CM | POA: Diagnosis not present

## 2020-03-21 DIAGNOSIS — Z853 Personal history of malignant neoplasm of breast: Secondary | ICD-10-CM | POA: Diagnosis not present

## 2020-03-21 DIAGNOSIS — Z923 Personal history of irradiation: Secondary | ICD-10-CM | POA: Diagnosis not present

## 2020-03-21 DIAGNOSIS — Z79899 Other long term (current) drug therapy: Secondary | ICD-10-CM | POA: Insufficient documentation

## 2020-03-21 DIAGNOSIS — D509 Iron deficiency anemia, unspecified: Secondary | ICD-10-CM | POA: Diagnosis not present

## 2020-03-21 DIAGNOSIS — C50912 Malignant neoplasm of unspecified site of left female breast: Secondary | ICD-10-CM

## 2020-03-21 DIAGNOSIS — Z803 Family history of malignant neoplasm of breast: Secondary | ICD-10-CM | POA: Insufficient documentation

## 2020-03-21 DIAGNOSIS — D75839 Thrombocytosis, unspecified: Secondary | ICD-10-CM

## 2020-03-21 LAB — IRON AND TIBC
Iron: 58 ug/dL (ref 28–170)
Saturation Ratios: 14 % (ref 10.4–31.8)
TIBC: 406 ug/dL (ref 250–450)
UIBC: 348 ug/dL

## 2020-03-21 LAB — CBC WITH DIFFERENTIAL/PLATELET
Abs Immature Granulocytes: 0.02 10*3/uL (ref 0.00–0.07)
Basophils Absolute: 0 10*3/uL (ref 0.0–0.1)
Basophils Relative: 0 %
Eosinophils Absolute: 0.4 10*3/uL (ref 0.0–0.5)
Eosinophils Relative: 5 %
HCT: 40.2 % (ref 36.0–46.0)
Hemoglobin: 13.5 g/dL (ref 12.0–15.0)
Immature Granulocytes: 0 %
Lymphocytes Relative: 19 %
Lymphs Abs: 1.4 10*3/uL (ref 0.7–4.0)
MCH: 31.7 pg (ref 26.0–34.0)
MCHC: 33.6 g/dL (ref 30.0–36.0)
MCV: 94.4 fL (ref 80.0–100.0)
Monocytes Absolute: 0.7 10*3/uL (ref 0.1–1.0)
Monocytes Relative: 9 %
Neutro Abs: 5.1 10*3/uL (ref 1.7–7.7)
Neutrophils Relative %: 67 %
Platelets: 492 10*3/uL — ABNORMAL HIGH (ref 150–400)
RBC: 4.26 MIL/uL (ref 3.87–5.11)
RDW: 13.5 % (ref 11.5–15.5)
WBC: 7.6 10*3/uL (ref 4.0–10.5)
nRBC: 0 % (ref 0.0–0.2)

## 2020-03-21 LAB — FERRITIN: Ferritin: 14 ng/mL (ref 11–307)

## 2020-03-21 NOTE — Progress Notes (Signed)
She is interested in covid vaccine. I told her it would be fine. She is not having any breast issues.

## 2020-03-24 NOTE — Progress Notes (Signed)
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 She has received Feraheme in the past last in March 2019 and her hemoglobin had improved. Patient also has thrombocytosis dating back to 2017. Jak 2 mutation testing has been negative. Bone marrow biopsy not done at this time as she is less than 50 years of age with no prior history of thrombosis.  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  OS plus AI was recommended without adjuvant chemotherapy.  Patient could not tolerate Lupron and was therefore switched to tamoxifen.  Patient would also not tolerate tamoxifen due to worsening headaches and stopped taking it.  Currently patient is not taking any antihormonal therapy.  For iron deficiency anemia she has been getting periodic Feraheme  Interval history-patient feels well and denies any complaints at this time.  Menstrual cycles are regular and sometimes heavy.  ECOG PS- 1 Pain scale- 0   Review of systems- Review of Systems  Constitutional: Negative for chills, fever, malaise/fatigue and weight loss.  HENT: Negative for congestion, ear discharge and  nosebleeds.   Eyes: Negative for blurred vision.  Respiratory: Negative for cough, hemoptysis, sputum production, shortness of breath and wheezing.   Cardiovascular: Negative for chest pain, palpitations, orthopnea and claudication.  Gastrointestinal: Negative for abdominal pain, blood in stool, constipation, diarrhea, heartburn, melena, nausea and vomiting.  Genitourinary: Negative for dysuria, flank pain, frequency, hematuria and urgency.  Musculoskeletal: Negative for back pain, joint pain and myalgias.  Skin: Negative for rash.  Neurological: Negative for dizziness, tingling, focal weakness, seizures, weakness and headaches.  Endo/Heme/Allergies: Does not bruise/bleed easily.  Psychiatric/Behavioral: Negative for depression and suicidal ideas. The patient does not have insomnia.       No Known Allergies   Past Medical History:  Diagnosis Date  . Amblyopia of eye, right   . Anemia   . Asthma    childhood  . Breast cancer ([place]) 2019  . Cancer ([place]) 10/27/2018   left breast  . Difficult intubation 11/03/2016   DL x 2 with MAC 3 (CRNA and MD), very anterior. DL x 2 with Glidescope #3 Lo pro, bougie utilized. Easy mask.  . Endometriosis   . GERD (gastroesophageal reflux disease)   . Migraine   . Personal history of radiation therapy      Past Surgical History:  Procedure Laterality Date  . ABLATION ON ENDOMETRIOSIS    . BREAST BIOPSY Left 10/28/2018   INVASIVE MAMMARY CARCINOMA  . BREAST EXCISIONAL BIOPSY Left 10/28/2018  . BREAST LUMPECTOMY Left 11/17/2018  . BREAST LUMPECTOMY Left 12/19/2018   re-excision Procedure: BREAST LUMPECTOMY;  Surgeon: Jules Husbands, MD;  Location: [facility];  Service: General;  Laterality: Left;  . BREAST LUMPECTOMY WITH NEEDLE LOCALIZATION Left 11/17/2018  Procedure: BREAST LUMPECTOMY WITH NEEDLE LOCALIZATION;  Surgeon: Jules Husbands, MD;  Location: [facility];  Service: General;  Laterality: Left;  . CLAVICLE SURGERY Left 10/2016  .  COLONOSCOPY WITH PROPOFOL N/A 06/27/2018   Procedure: COLONOSCOPY WITH PROPOFOL;  Surgeon: Lucilla Lame, MD;  Location: [place];  Service: Endoscopy;  Laterality: N/A;  . ESOPHAGOGASTRODUODENOSCOPY (EGD) WITH PROPOFOL N/A 06/27/2018   Procedure: ESOPHAGOGASTRODUODENOSCOPY (EGD) WITH PROPOFOL;  Surgeon: Lucilla Lame, MD;  Location: [place];  Service: Endoscopy;  Laterality: N/A;  . EYE MUSCLE SURGERY Right    child  . ORIF CLAVICULAR FRACTURE Left 11/03/2016   Procedure: OPEN REDUCTION INTERNAL FIXATION (ORIF) CLAVICULAR FRACTURE;  Surgeon: Corky Mull, MD;  Location: [facility];  Service: Orthopedics;  Laterality: Left;  . removal of fibroid    . SENTINEL NODE BIOPSY Left 11/17/2018   Procedure: SENTINEL NODE BIOPSY;  Surgeon: Jules Husbands, MD;  Location: [facility];  Service: General;  Laterality: Left;    Social History   Socioeconomic History  . Marital status: Married    Spouse name: 0  . Number of children: 0  . Years of education: Not on file  . Highest education level: Associate degree: academic program  Occupational History  . Occupation: Librarian, academic    Comment: [employer]  Tobacco Use  . Smoking status: Former Smoker    Packs/day: 1.00    Years: 15.00    Pack years: 15.00    Types: Cigarettes    Quit date: 02/21/2015    Years since quitting: 5.0  . Smokeless tobacco: Never Used  Substance and Sexual Activity  . Alcohol use: Yes    Alcohol/week: 2.0 standard drinks    Types: 2 Cans of beer per week    Comment: Occasional   . Drug use: No  . Sexual activity: Yes    Partners: Male    Comment: 1 female  Other Topics Concern  . 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 Difficulty of Paying Living Expenses:   Food Insecurity:   .  Worried About Charity fundraiser in the Last Year:   . Arboriculturist in the Last Year:   Transportation Needs:   . Film/video editor (Medical):   Marland Kitchen Lack of Transportation (Non-Medical):   Physical Activity:   . Days of Exercise per Week:   . Minutes of Exercise per Session:   Stress:   . Feeling of Stress :   Social Connections:   . Frequency of Communication with Friends and Family:   . Frequency of Social Gatherings with Friends and Family:   . Attends Religious Services:   . Active Member of Clubs or Organizations:   . Attends Archivist Meetings:   Marland Kitchen Marital Status:   Intimate Partner Violence:   . Fear of Current or Ex-Partner:   . Emotionally Abused:   Marland Kitchen Physically Abused:   . Sexually Abused:     Family History  Problem Relation Age of Onset  . Lupus Mother   . Hypertension Mother   . Lymphoma Mother 82       B cell; currently 78  . Hypertension Father  currently 74  . Diabetes Father   . Breast cancer Paternal Aunt 74       deceased 76  . Lung cancer Paternal Aunt 43       deceased 81  . Brain cancer Paternal Grandfather 43       deceased 62     Current Outpatient Medications:  .  hydrOXYzine (ATARAX/VISTARIL) 10 MG tablet, TAKE 1 TABLET BY MOUTH EVERYDAY AT BEDTIME, Disp: 30 tablet, Rfl: 8 .  ibuprofen (ADVIL,MOTRIN) 200 MG tablet, Take 400 mg by mouth 2 (two) times daily as needed for headache or moderate pain. , Disp: , Rfl:  .  omeprazole (PRILOSEC) 20 MG capsule, Take 1 capsule (20 mg total) by mouth 2 (two) times daily before a meal. (Patient taking differently: Take 20 mg by mouth 2 (two) times daily as needed. ), Disp: 60 capsule, Rfl: 1 .  polyethylene glycol (MIRALAX / GLYCOLAX) packet, Take 17 g by mouth daily as needed for moderate constipation., Disp: , Rfl:   Physical exam:  Vitals:   03/21/20 1033 03/21/20 1034  BP:  125/80  Pulse:  77  Resp:  16  Temp: 98.4 F (36.9 C)   TempSrc: Tympanic   Weight: 152 lb 12.8 oz  (69.3 kg)    Physical Exam Cardiovascular:     Rate and Rhythm: Normal rate and regular rhythm.     Heart sounds: Normal heart sounds.  Pulmonary:     Effort: Pulmonary effort is normal.     Breath sounds: Normal breath sounds.  Abdominal:     General: Bowel sounds are normal.     Palpations: Abdomen is soft.  Skin:    General: Skin is warm and dry.  Neurological:     Mental Status: She is alert and oriented to person, place, and time.    Breast exam was performed in seated and lying down position. Patient is status post left lumpectomy with a well-healed surgical scar. No evidence of any palpable masses. No evidence of axillary adenopathy. No evidence of any palpable masses or lumps in the right breast. No evidence of right axillary adenopathy   CMP Latest Ref Rng & Units 09/25/2019  Glucose 70 - 99 mg/dL 104(H)  BUN 6 - 20 mg/dL 12  Creatinine 0.44 - 1.00 mg/dL 0.81  Sodium 135 - 145 mmol/L 139  Potassium 3.5 - 5.1 mmol/L 4.1  Chloride 98 - 111 mmol/L 104  CO2 22 - 32 mmol/L 26  Calcium 8.9 - 10.3 mg/dL 9.1  Total Protein 6.5 - 8.1 g/dL 7.5  Total Bilirubin 0.3 - 1.2 mg/dL 0.5  Alkaline Phos 38 - 126 U/L 43  AST 15 - 41 U/L 20  ALT 0 - 44 U/L 13   CBC Latest Ref Rng & Units 03/21/2020  WBC 4.0 - 10.5 K/uL 7.6  Hemoglobin 12.0 - 15.0 g/dL 13.5  Hematocrit 36.0 - 46.0 % 40.2  Platelets 150 - 400 K/uL 492(H)      Assessment and plan- Patient is a 49 y.o. female with stage I left breast cancer ER positive s/p lumpectomy and adjuvant radiation treatment.  She is not currently on any hormone therapy and is here for follow-up of following issues:  1.  Stage I left breast cancer: Clinically patient is doing well and no concerning signs or symptoms of recurrence based on today's exam.She will be due for a repeat mammogram in October 2021 which we will order.  2.  Iron deficiency anemia: She is currently not anemic and  her iron studies are normal.  She does not require any IV  iron at this time.  3.  Thrombocytosis: Chronic and overall stable.  Given that she is less than 20 years of age and no prior thrombosis, I am not pursuing a bone marrow biopsy at this time.  Continue to monitor   Visit Diagnosis 1. Encounter for follow-up surveillance of breast cancer   2. Iron deficiency anemia, unspecified iron deficiency anemia type      Dr. Randa Evens, MD, MPH [facility] [phone] 03/24/2020 9:01 AM

## 2020-03-27 ENCOUNTER — Ambulatory Visit
Admission: RE | Admit: 2020-03-27 | Discharge: 2020-03-27 | Disposition: A | Discharge status: Home / Self Care | Payer: BC Managed Care – PPO | Source: Ambulatory Visit | Attending: Radiation Oncology | Admitting: Radiation Oncology

## 2020-03-27 ENCOUNTER — Other Ambulatory Visit: Payer: Self-pay

## 2020-03-27 ENCOUNTER — Encounter: Payer: Self-pay | Admitting: Radiation Oncology

## 2020-03-27 VITALS — BP 119/82 | HR 83 | Temp 97.1°F | Resp 16 | Wt 153.2 lb

## 2020-03-27 DIAGNOSIS — Z853 Personal history of malignant neoplasm of breast: Secondary | ICD-10-CM | POA: Insufficient documentation

## 2020-03-27 DIAGNOSIS — C50112 Malignant neoplasm of central portion of left female breast: Secondary | ICD-10-CM

## 2020-03-27 DIAGNOSIS — Z923 Personal history of irradiation: Secondary | ICD-10-CM | POA: Insufficient documentation

## 2020-03-27 NOTE — Progress Notes (Signed)
Radiation Oncology Follow up Note  Name: Melinda Holder   Date:   03/27/2020 MRN:  [id number] DOB: June 19, 1971    This 49 y.o. female presents to the clinic today for 1 year follow-up status post whole breast radiation to her left breast for stage I ER/PR positive invasive mammary carcinoma.  REFERRING PROVIDER: Leone Haven, MD  HPI: Patient is a 49 year old female now out 1 year having completed whole breast radiation to her left breast for stage I ER/PR positive invasive mammary carcinoma.  Seen today in routine follow-up she is doing well.  She specifically denies breast tenderness cough or bone pain..  She has declined antiestrogen therapy.  Her last mammogram was back in October I reviewed that was BI-RADS 2 benign.  COMPLICATIONS OF TREATMENT: none  FOLLOW UP COMPLIANCE: keeps appointments   PHYSICAL EXAM:  BP 119/82   Pulse 83   Temp (!) 97.1 F (36.2 C) (Tympanic)   Resp 16   Wt 153 lb 3.2 oz (69.5 kg)   BMI 28.02 kg/m  Lungs are clear to A&P cardiac examination essentially unremarkable with regular rate and rhythm. No dominant mass or nodularity is noted in either breast in 2 positions examined. Incision is well-healed. No axillary or supraclavicular adenopathy is appreciated. Cosmetic result is excellent.  Well-developed well-nourished patient in NAD. HEENT reveals PERLA, EOMI, discs not visualized.  Oral cavity is clear. No oral mucosal lesions are identified. Neck is clear without evidence of cervical or supraclavicular adenopathy. Lungs are clear to A&P. Cardiac examination is essentially unremarkable with regular rate and rhythm without murmur rub or thrill. Abdomen is benign with no organomegaly or masses noted. Motor sensory and DTR levels are equal and symmetric in the upper and lower extremities. Cranial nerves II through XII are grossly intact. Proprioception is intact. No peripheral adenopathy or edema is identified. No motor or sensory levels are noted. Crude  visual fields are within normal range.  RADIOLOGY RESULTS: Mammograms reviewed compatible with above-stated findings  PLAN: Present time patient is doing well 1 year out with no evidence of disease.  I am pleased with her overall progress.  I have asked to see her back in 1 year for follow-up.  She already has scheduled follow-up mammograms.  Patient knows to call with any concerns.  I would like to take this opportunity to thank you for allowing me to participate in the care of your patient.Noreene Filbert, MD

## 2020-08-12 ENCOUNTER — Encounter: Payer: BC Managed Care – PPO | Admitting: Family Medicine

## 2020-08-26 ENCOUNTER — Ambulatory Visit
Admission: EM | Admit: 2020-08-26 | Discharge: 2020-08-26 | Disposition: A | Discharge status: Home / Self Care | Payer: BC Managed Care – PPO | Attending: Emergency Medicine | Admitting: Emergency Medicine

## 2020-08-26 ENCOUNTER — Other Ambulatory Visit: Payer: Self-pay

## 2020-08-26 ENCOUNTER — Encounter: Payer: Self-pay | Admitting: Emergency Medicine

## 2020-08-26 DIAGNOSIS — G43009 Migraine without aura, not intractable, without status migrainosus: Secondary | ICD-10-CM | POA: Diagnosis not present

## 2020-08-26 DIAGNOSIS — Z20822 Contact with and (suspected) exposure to covid-19: Secondary | ICD-10-CM | POA: Insufficient documentation

## 2020-08-26 LAB — SARS CORONAVIRUS 2 (TAT 6-24 HRS): SARS Coronavirus 2: NEGATIVE

## 2020-08-26 MED ORDER — IBUPROFEN 600 MG PO TABS: 600.0000 mg | ORAL_TABLET | Freq: Four times a day (QID) | ORAL | 0 refills | Status: DC | PRN

## 2020-08-26 MED ORDER — FLUTICASONE PROPIONATE 50 MCG/ACT NA SUSP: 2.0000 | Freq: Every day | NASAL | 0 refills | Status: DC

## 2020-08-26 MED ORDER — ONDANSETRON 8 MG PO TBDP: ORAL_TABLET | ORAL | 0 refills | Status: DC

## 2020-08-26 MED ORDER — ACETAMINOPHEN 500 MG PO TABS
1000.0000 mg | ORAL_TABLET | Freq: Once | ORAL | Status: AC
  Administered 2020-08-26: 1000 mg via ORAL

## 2020-08-26 MED ORDER — KETOROLAC TROMETHAMINE 60 MG/2ML IM SOLN
30.0000 mg | Freq: Once | INTRAMUSCULAR | Status: AC
  Administered 2020-08-26: 30 mg via INTRAMUSCULAR

## 2020-08-26 MED ORDER — ONDANSETRON 8 MG PO TBDP
8.0000 mg | ORAL_TABLET | Freq: Once | ORAL | Status: AC
  Administered 2020-08-26: 8 mg via ORAL

## 2020-08-26 MED ORDER — DEXAMETHASONE SODIUM PHOSPHATE 10 MG/ML IJ SOLN
10.0000 mg | Freq: Once | INTRAMUSCULAR | Status: AC
  Administered 2020-08-26: 10 mg via INTRAMUSCULAR

## 2020-08-26 NOTE — ED Triage Notes (Signed)
Patient requesting covid test.

## 2020-08-26 NOTE — ED Provider Notes (Signed)
HPI  SUBJECTIVE:  Melinda Holder is a 49 y.o. female who reports gradual onset constant throbbing headache around both of her eyes starting yesterday evening.  She reports intermittent bilateral ear pain.  She states that she has had headaches like this before which have been diagnosed as migraines.  She reports 3 episodes of emesis yesterday, tolerating fluids today.  She reports body aches, nasal congestion, sinus pain and pressure.  She was exposed to Covid 1 week ago.  She got both doses of the Covid vaccine 2 months ago.  She reports photophobia, phonophobia.  No fevers, sore throat, loss of sense of smell or taste, cough, vomiting, diarrhea, abdominal pain.  No upper dental pain, facial swelling.  No neck stiffness, rash, arm or leg weakness, facial droop, slurred speech.  This is not the first or worst headache of her life.  No known tick bite.  No antipyretic in the past 6 hours.  No seizures, syncope.  She is requesting a Covid test.  She retried Tylenol 1000 mg once without improvement in her symptoms.  No aggravating factors.  Has a past medical history of migraines, breast cancer.  No history of diabetes, atrial fibrillation, hypertension, aneurysm.  LMP: 4 weeks ago.  Denies the possibility of being pregnant.  [id number], Angela Adam, MD   Past Medical History:  Diagnosis Date  . Amblyopia of eye, right   . Anemia   . Asthma    childhood  . Breast cancer ([place]) 2019  . Cancer ([place]) 10/27/2018   left breast  . Difficult intubation 11/03/2016   DL x 2 with MAC 3 (CRNA and MD), very anterior. DL x 2 with Glidescope #3 Lo pro, bougie utilized. Easy mask.  . Endometriosis   . GERD (gastroesophageal reflux disease)   . Migraine   . Personal history of radiation therapy     Past Surgical History:  Procedure Laterality Date  . ABLATION ON ENDOMETRIOSIS    . BREAST BIOPSY Left 10/28/2018   INVASIVE MAMMARY CARCINOMA  . BREAST EXCISIONAL BIOPSY Left 10/28/2018  . BREAST LUMPECTOMY  Left 11/17/2018  . BREAST LUMPECTOMY Left 12/19/2018   re-excision Procedure: BREAST LUMPECTOMY;  Surgeon: Jules Husbands, MD;  Location: [facility];  Service: General;  Laterality: Left;  . BREAST LUMPECTOMY WITH NEEDLE LOCALIZATION Left 11/17/2018   Procedure: BREAST LUMPECTOMY WITH NEEDLE LOCALIZATION;  Surgeon: Jules Husbands, MD;  Location: [facility];  Service: General;  Laterality: Left;  . CLAVICLE SURGERY Left 10/2016  . COLONOSCOPY WITH PROPOFOL N/A 06/27/2018   Procedure: COLONOSCOPY WITH PROPOFOL;  Surgeon: Lucilla Lame, MD;  Location: [place];  Service: Endoscopy;  Laterality: N/A;  . ESOPHAGOGASTRODUODENOSCOPY (EGD) WITH PROPOFOL N/A 06/27/2018   Procedure: ESOPHAGOGASTRODUODENOSCOPY (EGD) WITH PROPOFOL;  Surgeon: Lucilla Lame, MD;  Location: [place];  Service: Endoscopy;  Laterality: N/A;  . EYE MUSCLE SURGERY Right    child  . ORIF CLAVICULAR FRACTURE Left 11/03/2016   Procedure: OPEN REDUCTION INTERNAL FIXATION (ORIF) CLAVICULAR FRACTURE;  Surgeon: Corky Mull, MD;  Location: [facility];  Service: Orthopedics;  Laterality: Left;  . removal of fibroid    . SENTINEL NODE BIOPSY Left 11/17/2018   Procedure: SENTINEL NODE BIOPSY;  Surgeon: Jules Husbands, MD;  Location: [facility];  Service: General;  Laterality: Left;    Family History  Problem Relation Age of Onset  . Lupus Mother   . Hypertension Mother   . Lymphoma Mother 49       B cell;  currently 70  . Hypertension Father        currently 43  . Diabetes Father   . Breast cancer Paternal Aunt 48       deceased 57  . Lung cancer Paternal Aunt 21       deceased 47  . Brain cancer Paternal Grandfather 58       deceased 85    Social History   Tobacco Use  . Smoking status: Former Smoker    Packs/day: 1.00    Years: 15.00    Pack years: 15.00    Types: Cigarettes    Quit date: 02/21/2015    Years since quitting: 5.5  . Smokeless tobacco: Never Used  Vaping Use  . Vaping Use: Former  . Quit  date: 01/01/2019  Substance Use Topics  . Alcohol use: Yes    Alcohol/week: 2.0 standard drinks    Types: 2 Cans of beer per week    Comment: Occasional   . Drug use: No    No current facility-administered medications for this encounter.  Current Outpatient Medications:  .  hydrOXYzine (ATARAX/VISTARIL) 10 MG tablet, TAKE 1 TABLET BY MOUTH EVERYDAY AT BEDTIME, Disp: 30 tablet, Rfl: 8 .  omeprazole (PRILOSEC) 20 MG capsule, Take 1 capsule (20 mg total) by mouth 2 (two) times daily before a meal. (Patient taking differently: Take 20 mg by mouth 2 (two) times daily as needed. ), Disp: 60 capsule, Rfl: 1 .  polyethylene glycol (MIRALAX / GLYCOLAX) packet, Take 17 g by mouth daily as needed for moderate constipation., Disp: , Rfl:  .  fluticasone (FLONASE) 50 MCG/ACT nasal spray, Place 2 sprays into both nostrils daily., Disp: 16 g, Rfl: 0 .  ibuprofen (ADVIL) 600 MG tablet, Take 1 tablet (600 mg total) by mouth every 6 (six) hours as needed., Disp: 30 tablet, Rfl: 0 .  ondansetron (ZOFRAN ODT) 8 MG disintegrating tablet, 1/2- 1 tablet q 8 hr prn nausea, vomiting, Disp: 20 tablet, Rfl: 0  No Known Allergies   ROS  As noted in HPI.   Physical Exam  BP 128/80 (BP Location: Left Arm)   Pulse 98   Temp 98 F (36.7 C) (Oral)   Resp 18   Ht _0  (1.575 m)   Wt 68 kg   LMP 07/29/2020 (Approximate)   SpO2 97%   BMI 27.44 kg/m   Constitutional: Well developed, well nourished, appears uncomfortable Eyes: PERRL, EOMI, conjunctiva normal bilaterally.  HENT: Normocephalic, atraumatic,mucus membranes moist, normal dentition.  TM normal b/l. No TMJ tenderness. Normal dentition.+ mild clear nasal congestion, + frontal and maxillary sinus tenderness.  No temporal artery tenderness. Neck: no cervical LN  No meningismus Respiratory: normal inspiratory effort Cardiovascular: Normal rate, regular rhythm GI:  nondistended skin: No rash, skin intact Musculoskeletal: No edema, no tenderness, no  deformities Neurologic: Alert & oriented x 3, CN III-XII intact, romberg neg, finger-> nose, heel-> shin equal b/l, Romberg neg, tandem gait steady Psychiatric: Speech and behavior appropriate   ED Course   Medications  acetaminophen (TYLENOL) tablet 1,000 mg (1,000 mg Oral Given 08/26/20 0954)  ketorolac (TORADOL) injection 30 mg (30 mg Intramuscular Given 08/26/20 0954)  ondansetron (ZOFRAN-ODT) disintegrating tablet 8 mg (8 mg Oral Given 08/26/20 0954)  dexamethasone (DECADRON) injection 10 mg (10 mg Intramuscular Given 08/26/20 0953)    Orders Placed This Encounter  Procedures  . SARS CORONAVIRUS 2 (TAT 6-24 HRS) Nasopharyngeal Nasopharyngeal Swab    Standing Status:   Standing    Number of  Occurrences:   1    Order Specific Question:   Is this test for diagnosis or screening    Answer:   Diagnosis of ill patient    Order Specific Question:   Symptomatic for COVID-19 as defined by CDC    Answer:   Yes    Order Specific Question:   Date of Symptom Onset    Answer:   08/24/2020    Order Specific Question:   Hospitalized for COVID-19    Answer:   No    Order Specific Question:   Admitted to ICU for COVID-19    Answer:   No    Order Specific Question:   Previously tested for COVID-19    Answer:   Yes    Order Specific Question:   Resident in a congregate (group) care setting    Answer:   No    Order Specific Question:   Employed in healthcare setting    Answer:   No    Order Specific Question:   Pregnant    Answer:   No    Order Specific Question:   Has patient completed COVID vaccination(s) (2 doses of Pfizer/Moderna 1 dose of [employer])    Answer:   Yes   No results found for this or any previous visit (from the past 24 hour(s)). No results found.  Results for orders placed or performed during the hospital encounter of 08/26/20  SARS CORONAVIRUS 2 (TAT 6-24 HRS) Nasopharyngeal Nasopharyngeal Swab   Specimen: Nasopharyngeal Swab  Result Value Ref Range   SARS  Coronavirus 2 NEGATIVE NEGATIVE    ED Clinical Impression  1. Migraine without aura and without status migrainosus, not intractable   2. Encounter for laboratory testing for COVID-19 virus     ED Assessment/Plan  Covid test sent.  Pt describing typical pain, no sudden onset.  Suspect migraine headache triggered off by viral infection.  She does have sinus tenderness and some nasal congestion, however I would not expect a sinusitis to give her photophobia, phonophobia, nausea.  He states that this feels like previous migraines.  She does not have an otitis media for TMJ arthralgia.  Doubt SAH, ICH or space occupying lesion. Pt without fevers/chills, Pt has no meningeal sx, no nuchal rigidity. Doubt meningitis. Pt with normal neuro exam, no evidence of CVA/TIA.  Pt BP not elevated significantly, doubt hypertensive emergency. No evidence of temporal artery tenderness, no evidence of glaucoma or other ocular pathology. Will give headache cocktail ( dexamethasone 10 IM, zofran 8  po, Toradol  30 IM, Tylenol 1000 mg p.o.),and reassess.  Pt much improved after medications. Pt with continued non-focal neuro exam. Will d/c home with nsaid,  antiemetic, and have pt F/U with PCP.  Deferring antibiotic therapy this appears to be more of a migraine than bacterial sinusitis.  May do Flonase, saline nasal irrigation, Tylenol/ibuprofen, Zofran.  Discussed  MDM, treatment plan, and plan for follow up. Pt agrees with plan  Covid negative.  Meds ordered this encounter  Medications  . acetaminophen (TYLENOL) tablet 1,000 mg  . ketorolac (TORADOL) injection 30 mg  . ondansetron (ZOFRAN-ODT) disintegrating tablet 8 mg  . dexamethasone (DECADRON) injection 10 mg  . fluticasone (FLONASE) 50 MCG/ACT nasal spray    Sig: Place 2 sprays into both nostrils daily.    Dispense:  16 g    Refill:  0  . ibuprofen (ADVIL) 600 MG tablet    Sig: Take 1 tablet (600 mg total) by mouth every  6 (six) hours as needed.     Dispense:  30 tablet    Refill:  0  . ondansetron (ZOFRAN ODT) 8 MG disintegrating tablet    Sig: 1/2- 1 tablet q 8 hr prn nausea, vomiting    Dispense:  20 tablet    Refill:  0    *This clinic note was created using [employer]. Therefore, there may be occasional mistakes despite careful proofreading.  ?    Melynda Ripple, MD 08/28/20 [phone]

## 2020-08-26 NOTE — Discharge Instructions (Addendum)
Try Mucinex, saline nasal irrigation, Flonase for the nasal congestion.  This will help prevent a bacterial sinus infection.  Take 600 mg ibuprofen combined with 1000 mg of Tylenol together 3-4 times a day as needed for headache.  Zofran for nausea.  Push plenty of extra fluids.  We will contact you if your Covid PCR test comes back positive.

## 2020-08-26 NOTE — ED Triage Notes (Signed)
Patient in today c/o migraine and nausea x 2 days, emesis yesterday. Patient has not taken any OTC or prescriptions medicines for her symptoms.

## 2020-09-20 ENCOUNTER — Other Ambulatory Visit: Payer: Self-pay

## 2020-09-20 ENCOUNTER — Inpatient Hospital Stay: Payer: BC Managed Care – PPO | Attending: Oncology

## 2020-09-20 DIAGNOSIS — Z853 Personal history of malignant neoplasm of breast: Secondary | ICD-10-CM | POA: Insufficient documentation

## 2020-09-20 DIAGNOSIS — D509 Iron deficiency anemia, unspecified: Secondary | ICD-10-CM | POA: Diagnosis not present

## 2020-09-20 LAB — CBC WITH DIFFERENTIAL/PLATELET
Abs Immature Granulocytes: 0.02 10*3/uL (ref 0.00–0.07)
Basophils Absolute: 0 10*3/uL (ref 0.0–0.1)
Basophils Relative: 0 %
Eosinophils Absolute: 0.3 10*3/uL (ref 0.0–0.5)
Eosinophils Relative: 4 %
HCT: 39.8 % (ref 36.0–46.0)
Hemoglobin: 13.8 g/dL (ref 12.0–15.0)
Immature Granulocytes: 0 %
Lymphocytes Relative: 23 %
Lymphs Abs: 1.6 10*3/uL (ref 0.7–4.0)
MCH: 31.9 pg (ref 26.0–34.0)
MCHC: 34.7 g/dL (ref 30.0–36.0)
MCV: 91.9 fL (ref 80.0–100.0)
Monocytes Absolute: 0.6 10*3/uL (ref 0.1–1.0)
Monocytes Relative: 8 %
Neutro Abs: 4.4 10*3/uL (ref 1.7–7.7)
Neutrophils Relative %: 65 %
Platelets: 423 10*3/uL — ABNORMAL HIGH (ref 150–400)
RBC: 4.33 MIL/uL (ref 3.87–5.11)
RDW: 13.1 % (ref 11.5–15.5)
WBC: 6.9 10*3/uL (ref 4.0–10.5)
nRBC: 0 % (ref 0.0–0.2)

## 2020-09-20 LAB — IRON AND TIBC
Iron: 88 ug/dL (ref 28–170)
Saturation Ratios: 22 % (ref 10.4–31.8)
TIBC: 395 ug/dL (ref 250–450)
UIBC: 307 ug/dL

## 2020-09-20 LAB — FERRITIN: Ferritin: 18 ng/mL (ref 11–307)

## 2020-09-23 ENCOUNTER — Inpatient Hospital Stay (HOSPITAL_BASED_OUTPATIENT_CLINIC_OR_DEPARTMENT_OTHER): Payer: BC Managed Care – PPO | Admitting: Oncology

## 2020-09-23 DIAGNOSIS — Z08 Encounter for follow-up examination after completed treatment for malignant neoplasm: Secondary | ICD-10-CM | POA: Diagnosis not present

## 2020-09-23 DIAGNOSIS — D509 Iron deficiency anemia, unspecified: Secondary | ICD-10-CM | POA: Diagnosis not present

## 2020-09-23 DIAGNOSIS — Z853 Personal history of malignant neoplasm of breast: Secondary | ICD-10-CM

## 2020-09-26 NOTE — Progress Notes (Signed)
I connected with Melinda Holder on 09/26/20 at  2:45 PM EDT by video enabled telemedicine visit and verified that I am speaking with the correct person using two identifiers.   I discussed the limitations, risks, security and privacy concerns of performing an evaluation and management service by telemedicine and the availability of in-person appointments. I also discussed with the patient that there may be a patient responsible charge related to this service. The patient expressed understanding and agreed to proceed.  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 Iron deficiency anemia  Chief Complaint:  Routine f/u of breast cancer and iron deficiency anemia  History of present illness: Patient is a 49 year old premenopausal woman who sees me for her iron deficiency anemia. She has received Feraheme in the past last in March 2019 and her hemoglobin had improved. Patient also has thrombocytosis dating back to 2017. Jak 2 mutation testing has been negative. Bone marrow biopsy not done at this time as she is less than 41 years of age with no prior history of thrombosis.  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  OS plus AI was recommended without adjuvant chemotherapy.  Patient could not tolerate Lupron and was therefore switched to tamoxifen.  Patient would also not tolerate tamoxifen due to worsening headaches and stopped taking it.  Currently patient is not taking any antihormonal therapy.  For iron deficiency anemia she has been getting periodic  Feraheme   Interval history: she is not taking hormone therapy anymore due to intolerance. Reports menstrual cycles are regular. apetite and weight have remained stable   Review of Systems  Constitutional: Negative for chills, fever, malaise/fatigue and weight loss.  HENT: Negative for congestion, ear discharge and nosebleeds.   Eyes: Negative for blurred vision.  Respiratory: Negative for cough, hemoptysis, sputum production, shortness of breath and wheezing.   Cardiovascular: Negative for chest pain, palpitations, orthopnea and claudication.  Gastrointestinal: Negative for abdominal pain, blood in stool, constipation, diarrhea, heartburn, melena, nausea and vomiting.  Genitourinary: Negative for dysuria, flank pain, frequency, hematuria and urgency.  Musculoskeletal: Negative for back pain, joint pain and myalgias.  Skin: Negative for rash.  Neurological: Negative for dizziness, tingling, focal weakness, seizures, weakness and headaches.  Endo/Heme/Allergies: Does not bruise/bleed easily.  Psychiatric/Behavioral: Negative for depression and suicidal ideas. The patient does not have insomnia.     No Known Allergies  Past Medical History:  Diagnosis Date  . Amblyopia of eye, right   . Anemia   . Asthma    childhood  . Breast cancer ([place]) 2019  . Cancer ([place]) 10/27/2018   left breast  . Difficult intubation 11/03/2016   DL x 2 with MAC 3 (CRNA and MD), very anterior. DL x 2 with Glidescope #3 Lo pro, bougie utilized. Easy mask.  . Endometriosis   . GERD (gastroesophageal reflux disease)   . Migraine   . Personal history of radiation therapy     Past Surgical History:  Procedure Laterality Date  . ABLATION ON ENDOMETRIOSIS    . BREAST BIOPSY Left 10/28/2018   INVASIVE MAMMARY CARCINOMA  . BREAST  EXCISIONAL BIOPSY Left 10/28/2018  . BREAST LUMPECTOMY Left 11/17/2018  . BREAST LUMPECTOMY Left 12/19/2018   re-excision Procedure: BREAST LUMPECTOMY;  Surgeon: Jules Husbands,  MD;  Location: [facility];  Service: General;  Laterality: Left;  . BREAST LUMPECTOMY WITH NEEDLE LOCALIZATION Left 11/17/2018   Procedure: BREAST LUMPECTOMY WITH NEEDLE LOCALIZATION;  Surgeon: Jules Husbands, MD;  Location: [facility];  Service: General;  Laterality: Left;  . CLAVICLE SURGERY Left 10/2016  . COLONOSCOPY WITH PROPOFOL N/A 06/27/2018   Procedure: COLONOSCOPY WITH PROPOFOL;  Surgeon: Lucilla Lame, MD;  Location: [place];  Service: Endoscopy;  Laterality: N/A;  . ESOPHAGOGASTRODUODENOSCOPY (EGD) WITH PROPOFOL N/A 06/27/2018   Procedure: ESOPHAGOGASTRODUODENOSCOPY (EGD) WITH PROPOFOL;  Surgeon: Lucilla Lame, MD;  Location: [place];  Service: Endoscopy;  Laterality: N/A;  . EYE MUSCLE SURGERY Right    child  . ORIF CLAVICULAR FRACTURE Left 11/03/2016   Procedure: OPEN REDUCTION INTERNAL FIXATION (ORIF) CLAVICULAR FRACTURE;  Surgeon: Corky Mull, MD;  Location: [facility];  Service: Orthopedics;  Laterality: Left;  . removal of fibroid    . SENTINEL NODE BIOPSY Left 11/17/2018   Procedure: SENTINEL NODE BIOPSY;  Surgeon: Jules Husbands, MD;  Location: [facility];  Service: General;  Laterality: Left;    Social History   Socioeconomic History  . Marital status: Married    Spouse name: 0  . Number of children: 0  . Years of education: Not on file  . Highest education level: Associate degree: academic program  Occupational History  . Occupation: Librarian, academic    Comment: [employer]  Tobacco Use  . Smoking status: Former Smoker    Packs/day: 1.00    Years: 15.00    Pack years: 15.00    Types: Cigarettes    Quit date: 02/21/2015    Years since quitting: 5.6  . Smokeless tobacco: Never Used  Vaping Use  . Vaping Use: Former  . Quit date: 01/01/2019  Substance and Sexual Activity  . Alcohol use: Yes    Alcohol/week: 2.0 standard drinks    Types: 2 Cans of beer per week    Comment: Occasional   . Drug use: No  . Sexual activity: Yes    Partners: Male     Comment: 1 female  Other Topics Concern  . 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 Difficulty of Paying Living Expenses: Not on file  Food Insecurity:   . Worried About Charity fundraiser in the Last Year: Not on file  . Ran Out of Food in the Last Year: Not on file  Transportation Needs:   . Lack of Transportation (Medical): Not on file  . Lack of Transportation (Non-Medical): Not on file  Physical Activity:   . Days of Exercise per Week: Not on file  . Minutes of Exercise per Session: Not on file  Stress:   . Feeling of Stress : Not on file  Social Connections:   . Frequency of Communication with Friends and Family: Not on file  . Frequency of Social Gatherings with Friends and Family: Not on file  . Attends Religious Services: Not on file  . Active Member of Clubs or Organizations: Not on file  .  Attends Archivist Meetings: Not on file  . Marital Status: Not on file  Intimate Partner Violence:   . Fear of Current or Ex-Partner: Not on file  . Emotionally Abused: Not on file  . Physically Abused: Not on file  . Sexually Abused: Not on file    Family History  Problem Relation Age of Onset  . Lupus Mother   . Hypertension Mother   . Lymphoma Mother 53       B cell; currently 38  . Hypertension Father        currently 59  . Diabetes Father   . Breast cancer Paternal Aunt 91       deceased 58  . Lung cancer Paternal Aunt 83       deceased 64  . Brain cancer Paternal Grandfather 33       deceased 82     Current Outpatient Medications:  .  fluticasone (FLONASE) 50 MCG/ACT nasal spray, Place 2 sprays into both nostrils daily., Disp: 16 g, Rfl: 0 .  hydrOXYzine (ATARAX/VISTARIL) 10 MG tablet, TAKE 1 TABLET BY MOUTH EVERYDAY AT BEDTIME, Disp: 30  tablet, Rfl: 8 .  ibuprofen (ADVIL) 600 MG tablet, Take 1 tablet (600 mg total) by mouth every 6 (six) hours as needed., Disp: 30 tablet, Rfl: 0 .  omeprazole (PRILOSEC) 20 MG capsule, Take 1 capsule (20 mg total) by mouth 2 (two) times daily before a meal. (Patient taking differently: Take 20 mg by mouth 2 (two) times daily as needed. ), Disp: 60 capsule, Rfl: 1 .  ondansetron (ZOFRAN ODT) 8 MG disintegrating tablet, 1/2- 1 tablet q 8 hr prn nausea, vomiting, Disp: 20 tablet, Rfl: 0 .  polyethylene glycol (MIRALAX / GLYCOLAX) packet, Take 17 g by mouth daily as needed for moderate constipation., Disp: , Rfl:   No results found.  No images are attached to the encounter.   CMP Latest Ref Rng & Units 09/25/2019  Glucose 70 - 99 mg/dL 104(H)  BUN 6 - 20 mg/dL 12  Creatinine 0.44 - 1.00 mg/dL 0.81  Sodium 135 - 145 mmol/L 139  Potassium 3.5 - 5.1 mmol/L 4.1  Chloride 98 - 111 mmol/L 104  CO2 22 - 32 mmol/L 26  Calcium 8.9 - 10.3 mg/dL 9.1  Total Protein 6.5 - 8.1 g/dL 7.5  Total Bilirubin 0.3 - 1.2 mg/dL 0.5  Alkaline Phos 38 - 126 U/L 43  AST 15 - 41 U/L 20  ALT 0 - 44 U/L 13   CBC Latest Ref Rng & Units 09/20/2020  WBC 4.0 - 10.5 K/uL 6.9  Hemoglobin 12.0 - 15.0 g/dL 13.8  Hematocrit 36 - 46 % 39.8  Platelets 150 - 400 K/uL 423(H)     Observation/objective:appears in no acute distress over video visit today. Breathing is non labored.  Assessment and plan:Patient is a 49 yr old female who is here for f/u of following issues:  1. Stage I left breast cancer: s/p surgery/ adjuvant RT. Not taking hormone therapy. Will schedule mammogram for next month  2. Iron deficiency anemia: Presently not anemic. Iron levels are normal although ferritin low at 18. Hold off on IV iron at this time.    Follow-up instructions:cbc ferritin and iron studies in 6 months and see me in person  I discussed the assessment and treatment plan with the patient. The patient was provided an opportunity to ask  questions and all were answered. The patient agreed with the plan and demonstrated an understanding of the  instructions.   The patient was advised to call back or seek an in-person evaluation if the symptoms worsen or if the condition fails to improve as anticipated.    Visit Diagnosis: 1. Iron deficiency anemia, unspecified iron deficiency anemia type   2. Encounter for follow-up surveillance of breast cancer     Dr. Randa Evens, MD, MPH [facility] Tel- [phone] 09/26/2020 6:49 PM

## 2020-09-30 ENCOUNTER — Encounter: Payer: BC Managed Care – PPO | Admitting: Family Medicine

## 2020-10-24 ENCOUNTER — Ambulatory Visit
Admission: RE | Admit: 2020-10-24 | Discharge: 2020-10-24 | Disposition: A | Discharge status: Home / Self Care | Payer: BC Managed Care – PPO | Source: Ambulatory Visit | Attending: Oncology | Admitting: Oncology

## 2020-10-24 ENCOUNTER — Other Ambulatory Visit: Payer: Self-pay

## 2020-10-24 DIAGNOSIS — R922 Inconclusive mammogram: Secondary | ICD-10-CM | POA: Diagnosis not present

## 2020-10-24 DIAGNOSIS — Z08 Encounter for follow-up examination after completed treatment for malignant neoplasm: Secondary | ICD-10-CM | POA: Insufficient documentation

## 2020-10-24 DIAGNOSIS — Z853 Personal history of malignant neoplasm of breast: Secondary | ICD-10-CM | POA: Diagnosis not present

## 2020-10-26 ENCOUNTER — Encounter: Payer: Self-pay | Admitting: Oncology

## 2020-10-28 ENCOUNTER — Encounter: Payer: Self-pay | Admitting: *Deleted

## 2020-10-28 NOTE — Progress Notes (Signed)
I spoke to Dr. Janese Banks in regards to the patient's question about getting an MRI.  I called the radiologist per Dr. Elroy Channel request.  The radiologist who read the patient's mammogram is not in today, but will back in tomorrow.  I notified patient that I will discuss with the radiologist and will let her know the plan as soon as possible.  She is agreeable.

## 2020-10-28 NOTE — Telephone Encounter (Signed)
Melinda Holder is looking into it

## 2020-10-29 ENCOUNTER — Telehealth: Payer: Self-pay | Admitting: *Deleted

## 2020-10-29 ENCOUNTER — Encounter: Payer: Self-pay | Admitting: *Deleted

## 2020-10-29 NOTE — Progress Notes (Signed)
Reviewed mammogram results and MRI recommendation with Dr. Jimmye Norman the radiologist. He states she is at intermediate risk due to history of breast cancer and dense breast tissue - he suggested an MRI because he felt they were lucky to have found the first cancer. He said there is no specific literature for patients at intermediate risk - but felt it would be beneficial in this patient. Said it doesn't really matter if the MRI coinsides with the mammo or alternates every 6 months - There is no literature that one is better than the other.  Discussed with Dr. Janese Banks, and she recommends an MRI 6 months after her most recent mammogram.  Discussed recommendations with patient and she is agreeable.  Dr. Elroy Channel staff will schedule the MRI and inform patient of the appointment date.

## 2020-10-30 ENCOUNTER — Telehealth: Payer: Self-pay | Admitting: *Deleted

## 2020-10-30 NOTE — Telephone Encounter (Signed)
I called patient and let her know she can give me a call so we can work out a day that would be good for her and [place] to give her a flu shot.  Left my direct number

## 2020-11-06 ENCOUNTER — Inpatient Hospital Stay: Payer: BC Managed Care – PPO | Attending: Oncology

## 2020-11-08 ENCOUNTER — Other Ambulatory Visit: Payer: Self-pay | Admitting: Family Medicine

## 2020-11-11 ENCOUNTER — Encounter: Payer: Self-pay | Admitting: Family Medicine

## 2020-11-11 ENCOUNTER — Ambulatory Visit (INDEPENDENT_AMBULATORY_CARE_PROVIDER_SITE_OTHER): Payer: BC Managed Care – PPO | Admitting: Family Medicine

## 2020-11-11 ENCOUNTER — Other Ambulatory Visit: Payer: Self-pay

## 2020-11-11 VITALS — BP 110/70 | HR 90 | Temp 98.3°F | Ht 62.5 in | Wt 146.4 lb

## 2020-11-11 DIAGNOSIS — Z13220 Encounter for screening for lipoid disorders: Secondary | ICD-10-CM | POA: Diagnosis not present

## 2020-11-11 DIAGNOSIS — E663 Overweight: Secondary | ICD-10-CM

## 2020-11-11 DIAGNOSIS — R519 Headache, unspecified: Secondary | ICD-10-CM

## 2020-11-11 DIAGNOSIS — Z1159 Encounter for screening for other viral diseases: Secondary | ICD-10-CM | POA: Diagnosis not present

## 2020-11-11 DIAGNOSIS — Z23 Encounter for immunization: Secondary | ICD-10-CM | POA: Diagnosis not present

## 2020-11-11 DIAGNOSIS — Z0001 Encounter for general adult medical examination with abnormal findings: Secondary | ICD-10-CM

## 2020-11-11 DIAGNOSIS — G8929 Other chronic pain: Secondary | ICD-10-CM | POA: Diagnosis not present

## 2020-11-11 DIAGNOSIS — T148XXA Other injury of unspecified body region, initial encounter: Secondary | ICD-10-CM

## 2020-11-11 NOTE — Patient Instructions (Signed)
Nice to see you. We will get some labs today. I am going to reach out to your oncologist.  We will contact you after I hear back from her.

## 2020-11-11 NOTE — Progress Notes (Signed)
[name], MD Phone: [phone]  Melinda Holder is a 49 y.o. female who presents today for CPE.  Diet: Patient has been eating more vegetables and lean meats.  Occasionally drinking sweet tea though mostly drinking water. Exercise: Walks 3 to 4 days a week for 30 to 45 minutes at a time. Pap smear: 09/28/2019 NILM negative HPV sees gynecology Colonoscopy: 06/27/2018 no polyps Mammogram: 10/24/2020 negative, they did recommend MRI given dense breasts Family history-  Colon cancer: No  Breast cancer: No  Ovarian cancer: Aunt  Brain cancer: Paternal aunt and paternal grandfather  Patient reports negative genetic testing previously Menses: 1 time a month, light Vaccines-   Flu: Given today  Tetanus: Up-to-date  COVID19: Up-to-date HIV screening: Up-to-date Hep C Screening: Due Tobacco use: Quit cigarettes in 1999, occasionally vapes Alcohol use: Typically has a 12 pack over the weekend though not every weekend Illicit Drug use: No Dentist: Up-to-date Ophthalmology: Due  Chronic headaches: Patient notes she is had headaches since being treated with Lupron previously. Notes sometimes slight migraines though mostly they are bitemporal tension type headaches. No numbness or weakness. Takes Tylenol with some benefit.  Bruise: Notes a bruise on her dorsal left forearm. She occasionally gets some on her forearms though does not bruise anywhere else.   Active Ambulatory Problems    Diagnosis Date Noted  . Pruritus 09/03/2015  . Overweight (BMI 25.0-29.9) 09/03/2015  . Encounter for general adult medical examination with abnormal findings 02/27/2016  . Adnexal pain 11/18/2012  . Ankle swelling, right 05/20/2017  . Iron deficiency anemia 05/20/2017  . Constipation 05/20/2017  . Enteritis 06/30/2017  . Malrotation of intestine   . Elevated glucose 06/07/2018  . Acute gastritis without hemorrhage   . Acute duodenitis   . Other specified diseases of intestine   . Malignant  neoplasm of left female breast ([place]) 11/03/2018  . 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[place])   . Goals of care, counseling/discussion 01/05/2019  . Need for prophylactic vaccination and inoculation against influenza 09/20/2019  . Chronic headaches 11/14/2020  . Bruising 11/14/2020   Resolved Ambulatory Problems    Diagnosis Date Noted  . Encounter to establish care 09/03/2015  . Need for prophylactic vaccination with combined diphtheria-tetanus-pertussis (DTP) vaccine 09/03/2015  . Encounter for immunization 09/03/2015  . Need for Tdap vaccination 09/03/2015   Past Medical History:  Diagnosis Date  . Amblyopia of eye, right   . Anemia   . Asthma   . Breast cancer ([place]) 2019  . Cancer ([place]) 10/27/2018  . Difficult intubation 11/03/2016  . Endometriosis   . GERD (gastroesophageal reflux disease)   . Migraine   . Personal history of radiation therapy     Family History  Problem Relation Age of Onset  . Lupus Mother   . Hypertension Mother   . Lymphoma Mother 70       B cell; currently 7  . Hypertension Father        currently 51  . Diabetes Father   . Breast cancer Paternal Aunt 50       deceased 21  . Lung cancer Paternal Aunt 32       deceased 52  . Brain cancer Paternal Grandfather 75       deceased 49    Social History   Socioeconomic History  . Marital status: Married    Spouse name: 0  . Number of children: 0  . Years of education: Not on file  . Highest  education level: Associate degree: academic program  Occupational History  . Occupation: Librarian, academic    Comment: [employer]  Tobacco Use  . Smoking status: Former Smoker    Packs/day: 1.00    Years: 15.00    Pack years: 15.00    Types: Cigarettes    Quit date: 02/21/2015    Years since quitting: 5.7  . Smokeless tobacco: Never Used  Vaping Use  . Vaping Use: Former  . Quit date: 01/01/2019  Substance and Sexual Activity  . Alcohol use: Yes    Alcohol/week: 2.0 standard  drinks    Types: 2 Cans of beer per week    Comment: Occasional   . Drug use: No  . Sexual activity: Yes    Partners: Male    Comment: 1 female  Other Topics Concern  . 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 Difficulty of Paying Living Expenses: Not on file  Food Insecurity:   . Worried About Charity fundraiser in the Last Year: Not on file  . Ran Out of Food in the Last Year: Not on file  Transportation Needs:   . Lack of Transportation (Medical): Not on file  . Lack of Transportation (Non-Medical): Not on file  Physical Activity:   . Days of Exercise per Week: Not on file  . Minutes of Exercise per Session: Not on file  Stress:   . Feeling of Stress : Not on file  Social Connections:   . Frequency of Communication with Friends and Family: Not on file  . Frequency of Social Gatherings with Friends and Family: Not on file  . Attends Religious Services: Not on file  . Active Member of Clubs or Organizations: Not on file  . Attends Archivist Meetings: Not on file  . Marital Status: Not on file  Intimate Partner Violence:   . Fear of Current or Ex-Partner: Not on file  . Emotionally Abused: Not on file  . Physically Abused: Not on file  . Sexually Abused: Not on file    ROS  General:  Negative for nexplained weight loss, fever Skin: Negative for new or changing mole, sore that won't heal HEENT: Negative for trouble hearing, trouble seeing, ringing in ears, mouth sores, hoarseness, change in voice, dysphagia. CV:  Negative for chest pain, dyspnea, edema, palpitations Resp: Negative for cough, dyspnea, hemoptysis GI: Negative for nausea, vomiting, diarrhea, constipation, abdominal pain, melena, hematochezia. GU: Negative for dysuria, incontinence,  urinary hesitance, hematuria, vaginal or penile discharge, polyuria, sexual difficulty, lumps in testicle or breasts MSK: Negative for muscle cramps or aches, joint pain or swelling Neuro: Positive for headaches, negative for weakness, numbness, dizziness, passing out/fainting Psych: Negative for depression, anxiety, memory problems  Objective  Physical Exam Vitals:   11/11/20 1601  BP: 110/70  Pulse: 90  Temp: 98.3 F (36.8 C)  SpO2: 98%    BP Readings from Last 3 Encounters:  11/11/20 110/70  08/26/20 128/80  03/27/20 119/82   Wt Readings from Last 3 Encounters:  11/11/20 146 lb 6.4 oz (66.4 kg)  08/26/20 150 lb (68 kg)  03/27/20 153 lb 3.2 oz (69.5 kg)    Physical Exam Constitutional:  General: She is not in acute distress.    Appearance: She is not diaphoretic.  HENT:     Head: Normocephalic and atraumatic.  Eyes:     Conjunctiva/sclera: Conjunctivae normal.     Pupils: Pupils are equal, round, and reactive to light.  Cardiovascular:     Rate and Rhythm: Normal rate and regular rhythm.     Heart sounds: Normal heart sounds.  Pulmonary:     Effort: Pulmonary effort is normal.     Breath sounds: Normal breath sounds.  Chest:       Comments: No masses bilateral axillae Abdominal:     General: Bowel sounds are normal. There is no distension.     Palpations: Abdomen is soft.     Tenderness: There is no abdominal tenderness. There is no guarding or rebound.  Musculoskeletal:     Right lower leg: No edema.     Left lower leg: No edema.  Skin:    General: Skin is warm and dry.  Neurological:     Mental Status: She is alert.     Comments: EOMI, PERRL, sensation light touch intact bilateral V1 through V3, hearing intact to finger rub, shoulder shrug intact, 5/5 strength in bilateral biceps, triceps, grip, quads, hamstrings, plantar and dorsiflexion, sensation to light touch intact in bilateral UE and LE, normal gait  Psychiatric:        Mood and Affect: Mood  normal.      Assessment/Plan:   Problem List Items Addressed This Visit    Bruising    Suspect related to hitting her arms. We will check a CBC.      Relevant Orders   CBC (Completed)   Chronic headaches    Persistent ongoing headaches. Based on review of side effects of Lupron headaches can occur though she has not been on the Lupron in some time. Given her history of breast cancer and the persistence of her headaches we will try to arrange for an MRI of her brain.      Relevant Orders   MR Brain Wo Contrast   Encounter for general adult medical examination with abnormal findings - Primary    Physical exam completed. Encouraged continued healthy diet and exercise. Pap smear up-to-date through gynecology. I have communicated with her oncologist who recommended MRI breast given the lesion palpated on exam. They are arranging this. Colonoscopy is up-to-date. Flu vaccine given today. Lab work as outlined below.      Overweight (BMI 25.0-29.9)   Relevant Orders   HgB A1c (Completed)    Other Visit Diagnoses    Lipid screening       Relevant Orders   Comp Met (CMET) (Completed)   Lipid panel (Completed)   Need for hepatitis C screening test       Relevant Orders   Hepatitis C Antibody (Completed)   Need for immunization against influenza       Relevant Orders   Flu Vaccine QUAD 36+ mos IM (Completed)      This visit occurred during the SARS-CoV-2 public health emergency.  Safety protocols were in place, including screening questions prior to the visit, additional usage of staff PPE, and extensive cleaning of exam room while observing appropriate contact time as indicated for disinfecting solutions.    [name], MD [place]

## 2020-11-12 LAB — CBC
HCT: 38.8 % (ref 36.0–46.0)
Hemoglobin: 13.2 g/dL (ref 12.0–15.0)
MCHC: 34 g/dL (ref 30.0–36.0)
MCV: 93 fl (ref 78.0–100.0)
Platelets: 423 10*3/uL — ABNORMAL HIGH (ref 150.0–400.0)
RBC: 4.18 Mil/uL (ref 3.87–5.11)
RDW: 13.6 % (ref 11.5–15.5)
WBC: 6.4 10*3/uL (ref 4.0–10.5)

## 2020-11-12 LAB — COMPREHENSIVE METABOLIC PANEL
ALT: 11 U/L (ref 0–35)
AST: 17 U/L (ref 0–37)
Albumin: 4.2 g/dL (ref 3.5–5.2)
Alkaline Phosphatase: 46 U/L (ref 39–117)
BUN: 19 mg/dL (ref 6–23)
CO2: 29 mEq/L (ref 19–32)
Calcium: 9.2 mg/dL (ref 8.4–10.5)
Chloride: 103 mEq/L (ref 96–112)
Creatinine, Ser: 0.85 mg/dL (ref 0.40–1.20)
GFR: 80.94 mL/min (ref >60.00)
Glucose, Bld: 82 mg/dL (ref 70–99)
Potassium: 3.9 mEq/L (ref 3.5–5.1)
Sodium: 138 mEq/L (ref 135–145)
Total Bilirubin: 0.2 mg/dL (ref 0.2–1.2)
Total Protein: 7.2 g/dL (ref 6.0–8.3)

## 2020-11-12 LAB — LIPID PANEL
Cholesterol: 176 mg/dL (ref 0–200)
HDL: 48.2 mg/dL (ref >39.00)
NonHDL: 127.53
Total CHOL/HDL Ratio: 4
Triglycerides: 225 mg/dL — ABNORMAL HIGH (ref 0.0–149.0)
VLDL: 45 mg/dL — ABNORMAL HIGH (ref 0.0–40.0)

## 2020-11-12 LAB — LDL CHOLESTEROL, DIRECT: Direct LDL: 112 mg/dL

## 2020-11-12 LAB — HEMOGLOBIN A1C: Hgb A1c MFr Bld: 6.1 % (ref 4.6–6.5)

## 2020-11-13 ENCOUNTER — Telehealth: Payer: Self-pay | Admitting: Family Medicine

## 2020-11-13 ENCOUNTER — Other Ambulatory Visit: Payer: Self-pay | Admitting: *Deleted

## 2020-11-13 DIAGNOSIS — N63 Unspecified lump in unspecified breast: Secondary | ICD-10-CM

## 2020-11-13 LAB — HEPATITIS C ANTIBODY
Hepatitis C Ab: NONREACTIVE
SIGNAL TO CUT-OFF: 0.01 (ref <1.00)

## 2020-11-13 NOTE — Telephone Encounter (Signed)
Please let the patient know that her oncologist is going to coordinate for her to have a breast MRI to evaluate the lesion I felt on exam. They should be contacting her to get this set up. If she does not hear from them this week she should let us know. Thanks.

## 2020-11-13 NOTE — Telephone Encounter (Signed)
LVM for the patient to call back for information from provider.  Jany Buckwalter,cma

## 2020-11-13 NOTE — Telephone Encounter (Signed)
Patient returned my call and I informed her to be expecting a call from the oncologist to schedule a MRI of her breast in a week and if she does not hear from them for her to call and let us know.  The patient understood.  Lekeya Rollings,cma

## 2020-11-14 ENCOUNTER — Other Ambulatory Visit: Payer: Self-pay | Admitting: *Deleted

## 2020-11-14 ENCOUNTER — Telehealth: Payer: Self-pay

## 2020-11-14 ENCOUNTER — Telehealth: Payer: Self-pay | Admitting: Oncology

## 2020-11-14 DIAGNOSIS — R519 Headache, unspecified: Secondary | ICD-10-CM | POA: Insufficient documentation

## 2020-11-14 DIAGNOSIS — N63 Unspecified lump in unspecified breast: Secondary | ICD-10-CM

## 2020-11-14 DIAGNOSIS — T148XXA Other injury of unspecified body region, initial encounter: Secondary | ICD-10-CM | POA: Insufficient documentation

## 2020-11-14 DIAGNOSIS — G8929 Other chronic pain: Secondary | ICD-10-CM | POA: Insufficient documentation

## 2020-11-14 NOTE — Assessment & Plan Note (Signed)
Suspect related to hitting her arms. We will check a CBC.

## 2020-11-14 NOTE — Assessment & Plan Note (Signed)
Physical exam completed. Encouraged continued healthy diet and exercise. Pap smear up-to-date through gynecology. I have communicated with her oncologist who recommended MRI breast given the lesion palpated on exam. They are arranging this. Colonoscopy is up-to-date. Flu vaccine given today. Lab work as outlined below.

## 2020-11-14 NOTE — Telephone Encounter (Signed)
I called the patient per the provider and informed her that he will order the patient a mri of the brain due to her headaches and that the referral dept will call to schedule and she understood.  Zaniyah Wernette,cma

## 2020-11-14 NOTE — Telephone Encounter (Signed)
11/14/2020  Called pt to confirm additional MM and [place] per Dr. Janese Banks due to new RT BR nodule. Pt confirmed appt for 11/27/20 @ 2pm  SRW

## 2020-11-14 NOTE — Assessment & Plan Note (Signed)
Persistent ongoing headaches. Based on review of side effects of Lupron headaches can occur though she has not been on the Lupron in some time. Given her history of breast cancer and the persistence of her headaches we will try to arrange for an MRI of her brain.

## 2020-11-27 ENCOUNTER — Other Ambulatory Visit: Payer: Self-pay

## 2020-11-27 ENCOUNTER — Ambulatory Visit
Admission: RE | Admit: 2020-11-27 | Discharge: 2020-11-27 | Disposition: A | Discharge status: Home / Self Care | Payer: BC Managed Care – PPO | Source: Ambulatory Visit | Attending: Oncology | Admitting: Oncology

## 2020-11-27 DIAGNOSIS — N63 Unspecified lump in unspecified breast: Secondary | ICD-10-CM | POA: Diagnosis not present

## 2020-11-27 DIAGNOSIS — R922 Inconclusive mammogram: Secondary | ICD-10-CM | POA: Diagnosis not present

## 2020-11-27 DIAGNOSIS — Z853 Personal history of malignant neoplasm of breast: Secondary | ICD-10-CM | POA: Diagnosis not present

## 2020-12-04 ENCOUNTER — Telehealth: Payer: Self-pay | Admitting: Family Medicine

## 2020-12-04 NOTE — Telephone Encounter (Signed)
lft vm for pt to call ofc to sch MRI. 

## 2020-12-12 ENCOUNTER — Encounter: Payer: Self-pay | Admitting: Family Medicine

## 2020-12-13 ENCOUNTER — Ambulatory Visit
Admission: RE | Admit: 2020-12-13 | Discharge: 2020-12-13 | Disposition: A | Discharge status: Home / Self Care | Payer: BC Managed Care – PPO | Source: Ambulatory Visit | Attending: Family Medicine | Admitting: Family Medicine

## 2020-12-13 ENCOUNTER — Ambulatory Visit (INDEPENDENT_AMBULATORY_CARE_PROVIDER_SITE_OTHER): Payer: BC Managed Care – PPO | Admitting: Family Medicine

## 2020-12-13 ENCOUNTER — Other Ambulatory Visit: Payer: Self-pay

## 2020-12-13 ENCOUNTER — Encounter: Payer: Self-pay | Admitting: Family Medicine

## 2020-12-13 DIAGNOSIS — G8929 Other chronic pain: Secondary | ICD-10-CM | POA: Insufficient documentation

## 2020-12-13 DIAGNOSIS — R519 Headache, unspecified: Secondary | ICD-10-CM

## 2020-12-13 DIAGNOSIS — N649 Disorder of breast, unspecified: Secondary | ICD-10-CM

## 2020-12-13 DIAGNOSIS — Z853 Personal history of malignant neoplasm of breast: Secondary | ICD-10-CM | POA: Diagnosis not present

## 2020-12-13 NOTE — Assessment & Plan Note (Signed)
Noted previously on exam. She underwent diagnostic mammogram and ultrasound with no abnormalities. Breast exam today is benign. She was advised to monitor for any changes and let us or her oncologist know if she notices any changes in either breast.

## 2020-12-13 NOTE — Progress Notes (Signed)
  [name], MD Phone: [phone]  Melinda Holder is a 49 y.o. female who presents today for f/u.  Breast lesion: Patient underwent mammogram and ultrasound through her oncologist and this was noted to be benign. She presents for follow-up breast exam.  Chronic headaches: These have been ongoing for some time now. She takes Tylenol once daily for this. The headaches are in the frontal region. She has noticed some slight progressive vision changes though nothing sudden. No numbness or weakness. She scheduled for MRI today.  Social History   Tobacco Use  Smoking Status Former Smoker  . Packs/day: 1.00  . Years: 15.00  . Pack years: 15.00  . Types: Cigarettes  . Quit date: 02/21/2015  . Years since quitting: 5.8  Smokeless Tobacco Never Used     ROS see history of present illness  Objective  Physical Exam Vitals:   12/13/20 0807  BP: 130/80  Pulse: 90  Temp: 98.5 F (36.9 C)  SpO2: 98%    BP Readings from Last 3 Encounters:  12/13/20 130/80  11/11/20 110/70  08/26/20 128/80   Wt Readings from Last 3 Encounters:  12/13/20 146 lb (66.2 kg)  11/11/20 146 lb 6.4 oz (66.4 kg)  08/26/20 150 lb (68 kg)    Physical Exam Constitutional:      General: She is not in acute distress.    Appearance: She is not diaphoretic.  Cardiovascular:     Rate and Rhythm: Normal rate and regular rhythm.     Heart sounds: Normal heart sounds.  Pulmonary:     Effort: Pulmonary effort is normal.     Breath sounds: Normal breath sounds.  Chest:     Comments: Fulton Mole CMA served as chaperone, right breast with no masses, tenderness, nipple inversion, or skin changes noted Musculoskeletal:        General: No edema.  Skin:    General: Skin is warm and dry.  Neurological:     Mental Status: She is alert.      Assessment/Plan: Please see individual problem list.  Problem List Items Addressed This Visit    Breast lesion    Noted previously on exam. She underwent  diagnostic mammogram and ultrasound with no abnormalities. Breast exam today is benign. She was advised to monitor for any changes and let us or her oncologist know if she notices any changes in either breast.      Chronic headaches    She will keep her appointment for the MRI. I discussed that the MRI is negative she may have medication overuse headache and she would need to stop the Tylenol. We will contact her with the results of the MRI when they return.        This visit occurred during the SARS-CoV-2 public health emergency.  Safety protocols were in place, including screening questions prior to the visit, additional usage of staff PPE, and extensive cleaning of exam room while observing appropriate contact time as indicated for disinfecting solutions.    [name], MD [place]

## 2020-12-13 NOTE — Assessment & Plan Note (Signed)
She will keep her appointment for the MRI. I discussed that the MRI is negative she may have medication overuse headache and she would need to stop the Tylenol. We will contact her with the results of the MRI when they return.

## 2020-12-13 NOTE — Patient Instructions (Signed)
Nice to see you. Please keep your appointment for the MRI.

## 2020-12-16 ENCOUNTER — Encounter: Payer: Self-pay | Admitting: Family Medicine

## 2020-12-21 MED ORDER — NORTRIPTYLINE HCL 10 MG PO CAPS
10.0000 mg | ORAL_CAPSULE | Freq: Every day | ORAL | 2 refills | Status: DC
Start: 2020-12-21 — End: 2021-01-23

## 2020-12-25 NOTE — Telephone Encounter (Signed)
LMTCB to schedule 2 month f/u appt

## 2021-01-23 ENCOUNTER — Other Ambulatory Visit: Payer: Self-pay | Admitting: Family Medicine

## 2021-02-18 ENCOUNTER — Ambulatory Visit
Admission: EM | Admit: 2021-02-18 | Discharge: 2021-02-18 | Disposition: A | Discharge status: Home / Self Care | Payer: BC Managed Care – PPO | Attending: Emergency Medicine | Admitting: Emergency Medicine

## 2021-02-18 ENCOUNTER — Other Ambulatory Visit: Payer: Self-pay

## 2021-02-18 DIAGNOSIS — G43009 Migraine without aura, not intractable, without status migrainosus: Secondary | ICD-10-CM | POA: Diagnosis not present

## 2021-02-18 DIAGNOSIS — Z20822 Contact with and (suspected) exposure to covid-19: Secondary | ICD-10-CM | POA: Insufficient documentation

## 2021-02-18 LAB — SARS CORONAVIRUS 2 (TAT 6-24 HRS): SARS Coronavirus 2: NEGATIVE

## 2021-02-18 MED ORDER — ONDANSETRON 8 MG PO TBDP: ORAL_TABLET | ORAL | 0 refills | Status: DC

## 2021-02-18 MED ORDER — ONDANSETRON 8 MG PO TBDP
8.0000 mg | ORAL_TABLET | Freq: Once | ORAL | Status: AC
  Administered 2021-02-18: 8 mg via ORAL

## 2021-02-18 MED ORDER — IBUPROFEN 600 MG PO TABS: 600.0000 mg | ORAL_TABLET | Freq: Four times a day (QID) | ORAL | 0 refills | Status: DC | PRN

## 2021-02-18 MED ORDER — ACETAMINOPHEN 500 MG PO TABS
1000.0000 mg | ORAL_TABLET | Freq: Once | ORAL | Status: AC
  Administered 2021-02-18: 1000 mg via ORAL

## 2021-02-18 MED ORDER — DEXAMETHASONE SODIUM PHOSPHATE 10 MG/ML IJ SOLN
10.0000 mg | Freq: Once | INTRAMUSCULAR | Status: AC
  Administered 2021-02-18: 10 mg via INTRAMUSCULAR

## 2021-02-18 MED ORDER — KETOROLAC TROMETHAMINE 60 MG/2ML IM SOLN
30.0000 mg | Freq: Once | INTRAMUSCULAR | Status: AC
  Administered 2021-02-18: 30 mg via INTRAMUSCULAR

## 2021-02-18 NOTE — Discharge Instructions (Addendum)
Take 600 mg of ibuprofen combined with 1000 mg of Tylenol together 3 or 4 times a day as needed for pain.  Take the Zofran up to 3 times a day for nausea.  Push electrolyte containing fluids such as Pedialyte until your urine is clear.  Your Covid test will be back in 6 to 24 hours.

## 2021-02-18 NOTE — ED Provider Notes (Addendum)
HPI  SUBJECTIVE:  Melinda Holder is a 50 y.o. female who reports a throbbing, pounding gradual onset frontal headache this morning.  She reports nausea, vomiting, states she is unable to tolerate any p.o., nasal congestion, phonophobia.  She tried ibuprofen 400 mg without improvement in her symptoms.  Symptoms are worse with loud noise.  No fevers, N/V, visual changes, purulent nasal d/c,sinus pain/pressure, ear pain, jaw pain, dental pain,  dysarthria, focal weakness, facial droop, discoordination. No body aches, neck stiffness, rash. No mental status changes, seizures, syncope.  No sudden onset, did not occur during exertion. States is similar to previous migraines. Past medical history frequent chronic migraines/headaches, breast cancer in remission.  PMH negative for aneurysm, SAH/ICH, HIV, sinusitis, glaucoma, stroke, atrial fibrillation or temporal arteritis. Pt is not on any antiplatelets/anticoagulants.  She got the [employer] booster in January.  No known Covid exposure.  She is requesting Covid testing.  No antipyretics in the past 6 hours.  LMP: Last month.  Denies possibility being pregnant.  PMD: Leone Haven, MD neurology: [facility].  MRI 12/13/2020 was normal  Patient has been seen here for identical headaches that were diagnosed as migraines.  She was given Tylenol Toradol Zofran and dexamethasone with resolution in her symptoms last time.  COVID was negative.  Past Medical History:  Diagnosis Date  . Amblyopia of eye, right   . Anemia   . Asthma    childhood  . Breast cancer ([place]) 2019  . Cancer ([place]) 10/27/2018   left breast  . Difficult intubation 11/03/2016   DL x 2 with MAC 3 (CRNA and MD), very anterior. DL x 2 with Glidescope #3 Lo pro, bougie utilized. Easy mask.  . Endometriosis   . GERD (gastroesophageal reflux disease)   . Migraine   . Personal history of radiation therapy     Past Surgical History:  Procedure Laterality Date  . ABLATION ON  ENDOMETRIOSIS    . BREAST BIOPSY Left 10/28/2018   INVASIVE MAMMARY CARCINOMA  . BREAST EXCISIONAL BIOPSY Left 10/28/2018  . BREAST LUMPECTOMY Left 11/17/2018   INVASIVE MAMMARY CARCINOMA  . BREAST LUMPECTOMY Left 12/19/2018   re-excision Procedure: BREAST LUMPECTOMY;  Surgeon: Jules Husbands, MD;  Location: [facility];  Service: General;  Laterality: Left;  . BREAST LUMPECTOMY WITH NEEDLE LOCALIZATION Left 11/17/2018   Procedure: BREAST LUMPECTOMY WITH NEEDLE LOCALIZATION;  Surgeon: Jules Husbands, MD;  Location: [facility];  Service: General;  Laterality: Left;  . CLAVICLE SURGERY Left 10/2016  . COLONOSCOPY WITH PROPOFOL N/A 06/27/2018   Procedure: COLONOSCOPY WITH PROPOFOL;  Surgeon: Lucilla Lame, MD;  Location: [place];  Service: Endoscopy;  Laterality: N/A;  . ESOPHAGOGASTRODUODENOSCOPY (EGD) WITH PROPOFOL N/A 06/27/2018   Procedure: ESOPHAGOGASTRODUODENOSCOPY (EGD) WITH PROPOFOL;  Surgeon: Lucilla Lame, MD;  Location: [place];  Service: Endoscopy;  Laterality: N/A;  . EYE MUSCLE SURGERY Right    child  . ORIF CLAVICULAR FRACTURE Left 11/03/2016   Procedure: OPEN REDUCTION INTERNAL FIXATION (ORIF) CLAVICULAR FRACTURE;  Surgeon: Corky Mull, MD;  Location: [facility];  Service: Orthopedics;  Laterality: Left;  . removal of fibroid    . SENTINEL NODE BIOPSY Left 11/17/2018   Procedure: SENTINEL NODE BIOPSY;  Surgeon: Jules Husbands, MD;  Location: [facility];  Service: General;  Laterality: Left;    Family History  Problem Relation Age of Onset  . Lupus Mother   . Hypertension Mother   . Lymphoma Mother 35  B cell; currently 70  . Hypertension Father        currently 66  . Diabetes Father   . Breast cancer Paternal Aunt 10       deceased 68  . Lung cancer Paternal Aunt 46       deceased 71  . Brain cancer Paternal Grandfather 52       deceased 42    Social History   Tobacco Use  . Smoking status: Former Smoker    Packs/day: 1.00    Years: 15.00     Pack years: 15.00    Types: Cigarettes    Quit date: 02/21/2015    Years since quitting: 6.0  . Smokeless tobacco: Never Used  Vaping Use  . Vaping Use: Former  . Quit date: 01/01/2019  Substance Use Topics  . Alcohol use: Yes    Alcohol/week: 2.0 standard drinks    Types: 2 Cans of beer per week    Comment: Occasional   . Drug use: No    No current facility-administered medications for this encounter.  Current Outpatient Medications:  .  hydrOXYzine (ATARAX/VISTARIL) 10 MG tablet, TAKE 1 TABLET BY MOUTH EVERYDAY AT BEDTIME, Disp: 30 tablet, Rfl: 8 .  ibuprofen (ADVIL) 600 MG tablet, Take 1 tablet (600 mg total) by mouth every 6 (six) hours as needed., Disp: 30 tablet, Rfl: 0 .  ondansetron (ZOFRAN ODT) 8 MG disintegrating tablet, 1/2- 1 tablet q 8 hr prn nausea, vomiting, Disp: 20 tablet, Rfl: 0 .  polyethylene glycol (MIRALAX / GLYCOLAX) packet, Take 17 g by mouth daily as needed for moderate constipation., Disp: , Rfl:  .  nortriptyline (PAMELOR) 10 MG capsule, TAKE 1 CAPSULE BY MOUTH AT BEDTIME., Disp: 90 capsule, Rfl: 1  No Known Allergies   ROS  As noted in HPI.   Physical Exam  BP 114/82 (BP Location: Left Arm)   Pulse 79   Temp 98.2 F (36.8 C) (Oral)   Resp 18   Ht _0  (1.575 m)   Wt 68 kg   SpO2 99%   BMI 27.44 kg/m   Constitutional: Well developed, well nourished, no acute distress Eyes: PERRL, EOMI, conjunctiva normal bilaterally.  No photophobia HENT: Normocephalic, atraumatic,mucus membranes moist, normal dentition.  TM normal b/l.  Bilateral TMJ tenderness.  No nasal congestion, no sinus tenderness. No temporal artery tenderness.  Neck: no cervical LN no trapezial muscle tenderness. No meningismus Respiratory: normal inspiratory effort Cardiovascular: Normal rate, regular rhythm GI:  nondistended skin: No rash, skin intact Musculoskeletal: No edema, no tenderness, no deformities Neurologic: Alert & oriented x 3, CN III-XII intact, romberg neg,  finger-> nose, heel-> shin equal b/l, Romberg neg, tandem gait steady Psychiatric: Speech and behavior appropriate   ED Course   Medications  dexamethasone (DECADRON) injection 10 mg (10 mg Intramuscular Given 02/18/21 1238)  ketorolac (TORADOL) injection 30 mg (30 mg Intramuscular Given 02/18/21 1244)  acetaminophen (TYLENOL) tablet 1,000 mg (1,000 mg Oral Given 02/18/21 1244)  ondansetron (ZOFRAN-ODT) disintegrating tablet 8 mg (8 mg Oral Given 02/18/21 1238)    Orders Placed This Encounter  Procedures  . SARS CORONAVIRUS 2 (TAT 6-24 HRS) Nasopharyngeal Nasopharyngeal Swab    Standing Status:   Standing    Number of Occurrences:   1    Order Specific Question:   Is this test for diagnosis or screening    Answer:   Diagnosis of ill patient    Order Specific Question:   Symptomatic for COVID-19 as defined by [facility]  Answer:   Yes    Order Specific Question:   Date of Symptom Onset    Answer:   02/18/2021    Order Specific Question:   Hospitalized for COVID-19    Answer:   No    Order Specific Question:   Admitted to ICU for COVID-19    Answer:   No    Order Specific Question:   Previously tested for COVID-19    Answer:   Yes    Order Specific Question:   Resident in a congregate (group) care setting    Answer:   No    Order Specific Question:   Employed in healthcare setting    Answer:   No    Order Specific Question:   Pregnant    Answer:   No    Order Specific Question:   Has patient completed COVID vaccination(s) (2 doses of Pfizer/Moderna 1 dose of [employer])    Answer:   Yes    Order Specific Question:   Has patient completed COVID Booster / 3rd dose    Answer:   No   Results for orders placed or performed during the hospital encounter of 02/18/21 (from the past 24 hour(s))  SARS CORONAVIRUS 2 (TAT 6-24 HRS) Nasopharyngeal Nasopharyngeal Swab     Status: None   Collection Time: 02/18/21 12:43 PM   Specimen: Nasopharyngeal Swab  Result Value Ref Range   SARS  Coronavirus 2 NEGATIVE NEGATIVE   No results found.   ED Clinical Impression  1. Migraine without aura and without status migrainosus, not intractable   2. Encounter for laboratory testing for COVID-19 virus     ED Assessment/Plan  Outside records reviewed.  As noted in HPI.  Pt describing typical pain, no sudden onset. Doubt SAH, ICH or space occupying lesion. Pt without fevers/chills, Pt has no meningeal sx, no nuchal rigidity. Doubt meningitis. Pt with normal neuro exam, no evidence of CVA/TIA.  Pt BP not elevated significantly, doubt hypertensive emergency. No evidence of temporal artery tenderness, no evidence of glaucoma or other ocular pathology. Will give headache cocktail (dexamethasone 10 IM , zofran 8 po,  toradol 30 IM, tylenol 1000 mg ), and reassess.  We discussed doing Reglan versus Zofran.  Patient opted for Zofran  Patient has some TMJ tenderness but Tylenol ibuprofen will help with this.  Pt does not meet the [facility] criteria due to age, however this headache is identical to previous headaches.  She has no new neuro deficits, history of aneurysm, SAH, no brain tumor with a normal MRI 2 months ago.  COVID negative.   Pt much improved after medications. Pt with continued non-focal neuro exam. Will d/c home with Tylenol/ibuprofen, Zofran,, and have pt F/U with PCP or neurology if headaches persist. Discussed MDM, plan for follow up, signs and sx that should prompt return to ER. Pt agrees with plan  Meds ordered this encounter  Medications  . dexamethasone (DECADRON) injection 10 mg  . ketorolac (TORADOL) injection 30 mg  . acetaminophen (TYLENOL) tablet 1,000 mg  . ondansetron (ZOFRAN-ODT) disintegrating tablet 8 mg  . ondansetron (ZOFRAN ODT) 8 MG disintegrating tablet    Sig: 1/2- 1 tablet q 8 hr prn nausea, vomiting    Dispense:  20 tablet    Refill:  0  . ibuprofen (ADVIL) 600 MG tablet    Sig: Take 1 tablet (600 mg total) by mouth every 6 (six) hours as  needed.    Dispense:  30 tablet  Refill:  0    *This clinic note was created using [employer]. Therefore, there may be occasional mistakes despite careful proofreading.  ?    Melynda Ripple, MD 02/19/21 [id number]    Melynda Ripple, MD 02/19/21 [phone]

## 2021-02-18 NOTE — ED Triage Notes (Signed)
Patient complains of migraine headache that started this morning around 6am. States that she would like to be tested for covid as well, has had some nausea.

## 2021-03-25 ENCOUNTER — Ambulatory Visit
Admission: RE | Admit: 2021-03-25 | Discharge: 2021-03-25 | Disposition: A | Discharge status: Home / Self Care | Payer: BC Managed Care – PPO | Source: Ambulatory Visit | Attending: Radiation Oncology | Admitting: Radiation Oncology

## 2021-03-25 ENCOUNTER — Other Ambulatory Visit: Payer: Self-pay

## 2021-03-25 ENCOUNTER — Encounter: Payer: Self-pay | Admitting: Oncology

## 2021-03-25 ENCOUNTER — Other Ambulatory Visit: Payer: Self-pay | Admitting: *Deleted

## 2021-03-25 ENCOUNTER — Inpatient Hospital Stay (HOSPITAL_BASED_OUTPATIENT_CLINIC_OR_DEPARTMENT_OTHER): Payer: BC Managed Care – PPO | Admitting: Oncology

## 2021-03-25 ENCOUNTER — Inpatient Hospital Stay: Payer: BC Managed Care – PPO | Attending: Oncology

## 2021-03-25 VITALS — BP 124/78 | HR 78 | Resp 20 | Wt 150.0 lb

## 2021-03-25 DIAGNOSIS — Z08 Encounter for follow-up examination after completed treatment for malignant neoplasm: Secondary | ICD-10-CM

## 2021-03-25 DIAGNOSIS — Z853 Personal history of malignant neoplasm of breast: Secondary | ICD-10-CM

## 2021-03-25 DIAGNOSIS — Z170 Estrogen receptor positive status [ER+]: Secondary | ICD-10-CM

## 2021-03-25 DIAGNOSIS — D509 Iron deficiency anemia, unspecified: Secondary | ICD-10-CM | POA: Insufficient documentation

## 2021-03-25 DIAGNOSIS — C50912 Malignant neoplasm of unspecified site of left female breast: Secondary | ICD-10-CM

## 2021-03-25 DIAGNOSIS — C50112 Malignant neoplasm of central portion of left female breast: Secondary | ICD-10-CM

## 2021-03-25 LAB — FERRITIN: Ferritin: 6 ng/mL — ABNORMAL LOW (ref 11–307)

## 2021-03-25 LAB — IRON AND TIBC
Iron: 137 ug/dL (ref 28–170)
Saturation Ratios: 32 % — ABNORMAL HIGH (ref 10.4–31.8)
TIBC: 426 ug/dL (ref 250–450)
UIBC: 289 ug/dL

## 2021-03-25 LAB — CBC WITH DIFFERENTIAL/PLATELET
Abs Immature Granulocytes: 0.02 10*3/uL (ref 0.00–0.07)
Basophils Absolute: 0 10*3/uL (ref 0.0–0.1)
Basophils Relative: 1 %
Eosinophils Absolute: 0.3 10*3/uL (ref 0.0–0.5)
Eosinophils Relative: 6 %
HCT: 37.2 % (ref 36.0–46.0)
Hemoglobin: 12.6 g/dL (ref 12.0–15.0)
Immature Granulocytes: 0 %
Lymphocytes Relative: 25 %
Lymphs Abs: 1.3 10*3/uL (ref 0.7–4.0)
MCH: 32 pg (ref 26.0–34.0)
MCHC: 33.9 g/dL (ref 30.0–36.0)
MCV: 94.4 fL (ref 80.0–100.0)
Monocytes Absolute: 0.6 10*3/uL (ref 0.1–1.0)
Monocytes Relative: 12 %
Neutro Abs: 3 10*3/uL (ref 1.7–7.7)
Neutrophils Relative %: 56 %
Platelets: 432 10*3/uL — ABNORMAL HIGH (ref 150–400)
RBC: 3.94 MIL/uL (ref 3.87–5.11)
RDW: 13.2 % (ref 11.5–15.5)
WBC: 5.3 10*3/uL (ref 4.0–10.5)
nRBC: 0 % (ref 0.0–0.2)

## 2021-03-25 NOTE — Progress Notes (Signed)
Radiation Oncology Follow up Note  Name: Melinda Holder   Date:   03/25/2021 MRN:  [id number] DOB: December 16, 1971    This 50 y.o. 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  REFERRING PROVIDER: Leone Haven, MD  HPI: Patient is a 50 year old female now out 2 years having completed whole breast radiation to her left breast for stage I ER positive invasive mammary carcinoma.  Seen today in routine follow-up she is doing well.  She specifically denies breast tenderness cough or bone pain..  She mammogram and ultrasound back in December which I have reviewed were BI-RADS 1 negative.  She is not on antiestrogen therapy.  COMPLICATIONS OF TREATMENT: none  FOLLOW UP COMPLIANCE: keeps appointments   PHYSICAL EXAM:  There were no vitals taken for this visit. Lungs are clear to A&P cardiac examination essentially unremarkable with regular rate and rhythm. No dominant mass or nodularity is noted in either breast in 2 positions examined. Incision is well-healed. No axillary or supraclavicular adenopathy is appreciated. Cosmetic result is excellent.  Well-developed well-nourished patient in NAD. HEENT reveals PERLA, EOMI, discs not visualized.  Oral cavity is clear. No oral mucosal lesions are identified. Neck is clear without evidence of cervical or supraclavicular adenopathy. Lungs are clear to A&P. Cardiac examination is essentially unremarkable with regular rate and rhythm without murmur rub or thrill. Abdomen is benign with no organomegaly or masses noted. Motor sensory and DTR levels are equal and symmetric in the upper and lower extremities. Cranial nerves II through XII are grossly intact. Proprioception is intact. No peripheral adenopathy or edema is identified. No motor or sensory levels are noted. Crude visual fields are within normal range.  RADIOLOGY RESULTS: Mammogram  ultrasound reviewed compatible with above-stated findings  PLAN: Present time patient is doing well 2 years out with no evidence of disease I am pleased with her overall progress.  I have asked to see her back in 1 year for follow-up.  Patient knows to call at anytime with any concerns.  I would like to take this opportunity to thank you for allowing me to participate in the care of your patient.Noreene Filbert, MD

## 2021-03-26 ENCOUNTER — Ambulatory Visit: Payer: BC Managed Care – PPO | Admitting: Radiation Oncology

## 2021-03-26 NOTE — Progress Notes (Signed)
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 Iron deficiency anemia  Chief complaint/ Reason for visit-routine follow-up of iron deficiency anemia and breast cancer  Heme/Onc history:  Patient is a 50 year old premenopausal woman who sees me for her iron deficiency anemia. She has received Feraheme in the past last in March 2019 and her hemoglobin had improved. Patient also has thrombocytosis dating back to 2017. Jak 2 mutation testing has been negative. Bone marrow biopsy not done at this time as she is less than 65 years of age with no prior history of thrombosis.  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 OS plus AI was recommended without adjuvant chemotherapy. Patient could not tolerate Lupron and was therefore switched to tamoxifen. Patient would also not tolerate tamoxifen due to worsening headaches and stopped taking it. Currently patient is not taking any antihormonal therapy.  For iron deficiency anemia she has been getting periodic Feraheme   Interval history-patient reports doing well overall.  Denies any breast concerns today.  Appetite and weight have remained stable.  Denies any new aches and pains anywhere.  She has had mild intermittent headaches for which she underwent an MRI which was unremarkable.  ECOG PS- 0 Pain scale-  0   Review of systems- Review of Systems  Constitutional: Negative for chills, fever, malaise/fatigue and weight loss.  HENT: Negative for congestion, ear discharge and nosebleeds.   Eyes: Negative for blurred vision.  Respiratory: Negative for cough, hemoptysis, sputum production, shortness of breath and wheezing.   Cardiovascular: Negative for chest pain, palpitations, orthopnea and claudication.  Gastrointestinal: Negative for abdominal pain, blood in stool, constipation, diarrhea, heartburn, melena, nausea and vomiting.  Genitourinary: Negative for dysuria, flank pain, frequency, hematuria and urgency.  Musculoskeletal: Negative for back pain, joint pain and myalgias.  Skin: Negative for rash.  Neurological: Negative for dizziness, tingling, focal weakness, seizures, weakness and headaches.  Endo/Heme/Allergies: Does not bruise/bleed easily.  Psychiatric/Behavioral: Negative for depression and suicidal ideas. The patient does not have insomnia.       No Known Allergies   Past Medical History:  Diagnosis Date  . Amblyopia of eye, right   . Anemia   . Asthma    childhood  . Breast cancer ([place]) 2019  . Cancer ([place]) 10/27/2018   left breast  . Difficult intubation 11/03/2016   DL x 2 with MAC 3 (CRNA and MD), very anterior. DL x 2 with Glidescope #3 Lo pro, bougie utilized. Easy mask.  . Endometriosis   . GERD (gastroesophageal reflux disease)   . Migraine   . Personal history of radiation therapy      Past Surgical History:  Procedure Laterality Date  . ABLATION ON ENDOMETRIOSIS    . BREAST BIOPSY Left 10/28/2018   INVASIVE MAMMARY CARCINOMA  . BREAST EXCISIONAL BIOPSY Left 10/28/2018  . BREAST LUMPECTOMY Left 11/17/2018   INVASIVE MAMMARY CARCINOMA  . BREAST LUMPECTOMY Left 12/19/2018  re-excision Procedure: BREAST LUMPECTOMY;  Surgeon: Jules Husbands, MD;  Location: [facility];  Service: General;  Laterality: Left;  . BREAST LUMPECTOMY WITH NEEDLE LOCALIZATION Left  11/17/2018   Procedure: BREAST LUMPECTOMY WITH NEEDLE LOCALIZATION;  Surgeon: Jules Husbands, MD;  Location: [facility];  Service: General;  Laterality: Left;  . CLAVICLE SURGERY Left 10/2016  . COLONOSCOPY WITH PROPOFOL N/A 06/27/2018   Procedure: COLONOSCOPY WITH PROPOFOL;  Surgeon: Lucilla Lame, MD;  Location: [place];  Service: Endoscopy;  Laterality: N/A;  . ESOPHAGOGASTRODUODENOSCOPY (EGD) WITH PROPOFOL N/A 06/27/2018   Procedure: ESOPHAGOGASTRODUODENOSCOPY (EGD) WITH PROPOFOL;  Surgeon: Lucilla Lame, MD;  Location: [place];  Service: Endoscopy;  Laterality: N/A;  . EYE MUSCLE SURGERY Right    child  . ORIF CLAVICULAR FRACTURE Left 11/03/2016   Procedure: OPEN REDUCTION INTERNAL FIXATION (ORIF) CLAVICULAR FRACTURE;  Surgeon: Corky Mull, MD;  Location: [facility];  Service: Orthopedics;  Laterality: Left;  . removal of fibroid    . SENTINEL NODE BIOPSY Left 11/17/2018   Procedure: SENTINEL NODE BIOPSY;  Surgeon: Jules Husbands, MD;  Location: [facility];  Service: General;  Laterality: Left;    Social History   Socioeconomic History  . Marital status: Married    Spouse name: 0  . Number of children: 0  . Years of education: Not on file  . Highest education level: Associate degree: academic program  Occupational History  . Occupation: Librarian, academic    Comment: [employer]  Tobacco Use  . Smoking status: Former Smoker    Packs/day: 1.00    Years: 15.00    Pack years: 15.00    Types: Cigarettes    Quit date: 02/21/2015    Years since quitting: 6.0  . Smokeless tobacco: Never Used  Vaping Use  . Vaping Use: Former  . Quit date: 01/01/2019  Substance and Sexual Activity  . Alcohol use: Yes    Alcohol/week: 2.0 standard drinks    Types: 2 Cans of beer per week    Comment: Occasional   . Drug use: No  . Sexual activity: Yes    Partners: Male    Comment: 1 female  Other Topics Concern  . 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 Lupus Mother   . Hypertension Mother   . Lymphoma Mother 75       B cell; currently 38  . Hypertension Father        currently 39  . Diabetes Father   . Breast cancer Paternal Aunt 20       deceased 22  . Lung cancer Paternal Aunt 41       deceased 70  . Brain cancer Paternal Grandfather 40       deceased 41     Current Outpatient Medications:  .  hydrOXYzine (ATARAX/VISTARIL) 10 MG tablet, TAKE 1 TABLET BY MOUTH EVERYDAY AT BEDTIME, Disp: 30 tablet, Rfl: 8 .  ibuprofen (ADVIL) 600 MG tablet, Take 1 tablet (600 mg total) by mouth every 6 (six) hours as needed., Disp: 30 tablet, Rfl: 0 .  ondansetron (ZOFRAN ODT) 8 MG disintegrating tablet, 1/2- 1 tablet q 8 hr prn nausea, vomiting (Patient not taking: Reported on 03/25/2021), Disp: 20 tablet, Rfl: 0 .  polyethylene glycol (MIRALAX / GLYCOLAX) packet, Take 17 g by mouth daily as needed for moderate constipation. (Patient not taking: Reported on 03/25/2021), Disp: , Rfl:   Physical exam:  Vitals:   03/25/21 0945  BP: 124/78  Pulse: 78  Resp: 20  SpO2: 98%  Weight: 150 lb (68 kg)   Physical Exam Constitutional:      General: She is not in acute distress. Eyes:     Pupils: Pupils are equal, round, and reactive to light.  Cardiovascular:     Rate and Rhythm: Normal rate and regular rhythm.     Heart sounds: Normal heart sounds.  Pulmonary:     Effort: Pulmonary effort is normal.     Breath sounds: Normal breath sounds.  Skin:    General: Skin is warm and dry.  Neurological:      Mental Status: She is alert and oriented to person, place, and time.    Breast exam was performed in seated and lying down position. Patient is status post left lumpectomy with a well-healed surgical scar. No evidence of any palpable masses. No evidence of axillary adenopathy. No evidence of any palpable masses or lumps in the right breast. No evidence of right axillary adenopathy   CMP Latest Ref Rng & Units 11/11/2020  Glucose 70 - 99 mg/dL 82  BUN 6 - 23 mg/dL 19  Creatinine 0.40 - 1.20 mg/dL 0.85  Sodium 135 - 145 mEq/L 138  Potassium 3.5 - 5.1 mEq/L 3.9  Chloride 96 - 112 mEq/L 103  CO2 19 - 32 mEq/L 29  Calcium 8.4 - 10.5 mg/dL 9.2  Total Protein 6.0 - 8.3 g/dL 7.2  Total Bilirubin 0.2 - 1.2 mg/dL 0.2  Alkaline Phos 39 - 117 U/L 46  AST 0 - 37 U/L 17  ALT 0 - 35 U/L 11   CBC Latest Ref Rng & Units 03/25/2021  WBC 4.0 - 10.5 K/uL 5.3  Hemoglobin 12.0 - 15.0 g/dL 12.6  Hematocrit 36.0 - 46.0 % 37.2  Platelets 150 - 400 K/uL 432(H)    Assessment and plan- Patient is a 50 y.o. female who is here for follow-up of following issues:  1.  Stage I left breast cancer: She is s/p surgery and adjuvant radiation therapy.  Not presently on hormone therapy due to intolerance to tamoxifen.  She would be due for bilateral mammogram in October 2022 which I will schedule.  Clinically she is doing well with no concerning signs and symptoms of recurrence based on today's exam.  2.  Iron deficiency anemia: Her iron studies did show a low ferritin of 6 with normal iron studies.  Hemoglobin is remained stable and she is not presently anemic.  I will repeat her iron studies again in 6 months and if she remains anemic at that time I will consider giving her IV iron.  Patient has had prior EGD and colonoscopy by Dr. Allen Norris in 2019 which was overall unremarkable   Visit Diagnosis 1. Encounter for follow-up surveillance of breast cancer   2. Iron deficiency anemia, unspecified iron deficiency anemia type       Dr. Randa Evens, MD, MPH [place] [facility]  [phone] 03/26/2021 8:21 AM

## 2021-03-28 ENCOUNTER — Encounter: Payer: Self-pay | Admitting: Oncology

## 2021-04-21 ENCOUNTER — Other Ambulatory Visit: Payer: Self-pay | Admitting: *Deleted

## 2021-04-21 DIAGNOSIS — Z170 Estrogen receptor positive status [ER+]: Secondary | ICD-10-CM

## 2021-04-21 DIAGNOSIS — C50912 Malignant neoplasm of unspecified site of left female breast: Secondary | ICD-10-CM

## 2021-06-16 ENCOUNTER — Ambulatory Visit
Admission: RE | Admit: 2021-06-16 | Discharge: 2021-06-16 | Disposition: A | Discharge status: Home / Self Care | Payer: BC Managed Care – PPO | Source: Ambulatory Visit | Attending: Internal Medicine | Admitting: Internal Medicine

## 2021-06-16 ENCOUNTER — Other Ambulatory Visit: Payer: Self-pay

## 2021-06-16 VITALS — BP 125/83 | HR 71 | Temp 98.0°F | Resp 17 | Ht 62.0 in | Wt 150.0 lb

## 2021-06-16 DIAGNOSIS — U071 COVID-19: Secondary | ICD-10-CM

## 2021-06-16 MED ORDER — MOLNUPIRAVIR 200 MG PO CAPS: 800.0000 mg | ORAL_CAPSULE | Freq: Two times a day (BID) | ORAL | 0 refills | Status: DC

## 2021-06-16 MED ORDER — BENZONATATE 200 MG PO CAPS: 200.0000 mg | ORAL_CAPSULE | Freq: Two times a day (BID) | ORAL | 0 refills | Status: DC | PRN

## 2021-06-16 NOTE — ED Provider Notes (Signed)
[facility]    CSN: [id number] Arrival date & time: 06/16/21  0948      History   Chief Complaint Chief Complaint  Patient presents with   Covid Positive    HPI Melinda Holder is a 50 y.o. female who is here due to testing positive for Covid at home 3 days ago and she is still having fatigue and body aches. Has felt chest pressure when she coughs and lays down. Denies having any fever or sweats. Has had 3 covid injections today in the past year    Past Medical History:  Diagnosis Date   Amblyopia of eye, right    Anemia    Asthma    childhood   Breast cancer ([place]) 2019   Cancer ([place]) 10/27/2018   left breast   Difficult intubation 11/03/2016   DL x 2 with MAC 3 (CRNA and MD), very anterior. DL x 2 with Glidescope #3 Lo pro, bougie utilized. Easy mask.   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)    Malignant neoplasm of left female breast ([place]) 11/03/2018   Acute gastritis without hemorrhage    Acute duodenitis    Other specified diseases of intestine    Elevated glucose 06/07/2018   Enteritis 06/30/2017   Malrotation of intestine    Ankle swelling, right 05/20/2017   Iron deficiency anemia 05/20/2017   Constipation 05/20/2017   Encounter for general adult medical examination with abnormal findings 02/27/2016   Pruritus 09/03/2015   Overweight (BMI 25.0-29.9) 09/03/2015   Adnexal pain 11/18/2012    Past Surgical History:  Procedure Laterality Date   ABLATION ON ENDOMETRIOSIS     BREAST BIOPSY Left 10/28/2018   INVASIVE MAMMARY CARCINOMA   BREAST  EXCISIONAL BIOPSY Left 10/28/2018   BREAST LUMPECTOMY Left 11/17/2018   INVASIVE MAMMARY CARCINOMA   BREAST LUMPECTOMY Left 12/19/2018   re-excision Procedure: BREAST LUMPECTOMY;  Surgeon: Jules Husbands, MD;  Location: [facility];  Service: General;  Laterality: Left;   BREAST LUMPECTOMY WITH NEEDLE LOCALIZATION Left 11/17/2018   Procedure: BREAST LUMPECTOMY WITH NEEDLE LOCALIZATION;  Surgeon: Jules Husbands, MD;  Location: [facility];  Service: General;  Laterality: Left;   CLAVICLE SURGERY Left 10/2016   COLONOSCOPY WITH PROPOFOL N/A 06/27/2018   Procedure: COLONOSCOPY WITH PROPOFOL;  Surgeon: Lucilla Lame, MD;  Location: [place];  Service: Endoscopy;  Laterality: N/A;   ESOPHAGOGASTRODUODENOSCOPY (EGD) WITH PROPOFOL N/A 06/27/2018   Procedure: ESOPHAGOGASTRODUODENOSCOPY (EGD) WITH PROPOFOL;  Surgeon: Lucilla Lame, MD;  Location: [place];  Service: Endoscopy;  Laterality: N/A;   EYE MUSCLE SURGERY Right    child   ORIF CLAVICULAR FRACTURE Left 11/03/2016   Procedure: OPEN REDUCTION INTERNAL FIXATION (ORIF) CLAVICULAR FRACTURE;  Surgeon: Corky Mull, MD;  Location: [facility];  Service: Orthopedics;  Laterality: Left;   removal of fibroid     SENTINEL NODE BIOPSY Left 11/17/2018   Procedure: SENTINEL NODE BIOPSY;  Surgeon: Jules Husbands, MD;  Location: [facility];  Service: General;  Laterality: Left;    OB History     Gravida  2  Para  0   Term  0   Preterm  0   AB  2   Living  0      SAB  0   IAB  0   Ectopic  0   Multiple  0   Live Births  0        Obstetric Comments  1st Menstrual Cycle:  14             Home Medications    Prior to Admission medications   Medication Sig Start Date End Date Taking? Authorizing Provider  benzonatate (TESSALON) 200 MG capsule Take 1 capsule (200 mg total) by mouth 2 (two) times daily as needed for cough. 06/16/21  Yes Rodriguez-Southworth, Sunday Spillers, PA-C  Molnupiravir 200 MG CAPS Take 4 capsules (800 mg  total) by mouth 2 (two) times daily. 06/16/21  Yes Rodriguez-Southworth, Sunday Spillers, PA-C  hydrOXYzine (ATARAX/VISTARIL) 10 MG tablet TAKE 1 TABLET BY MOUTH EVERYDAY AT BEDTIME 11/08/20   Leone Haven, MD  ibuprofen (ADVIL) 600 MG tablet Take 1 tablet (600 mg total) by mouth every 6 (six) hours as needed. 02/18/21   Melynda Ripple, MD  polyethylene glycol [facility] / Floria Raveling) packet Take 17 g by mouth daily as needed for moderate constipation. Patient not taking: No sig reported    [provider]  fluticasone (FLONASE) 50 MCG/ACT nasal spray Place 2 sprays into both nostrils daily. 08/26/20 02/18/21  Melynda Ripple, MD    Family History Family History  Problem Relation Age of Onset   Lupus Mother    Hypertension Mother    Lymphoma Mother 44       B cell; currently 74   Hypertension Father        currently 6   Diabetes Father    Breast cancer Paternal Aunt 38       deceased 75   Lung cancer Paternal Aunt 24       deceased 72   Brain cancer Paternal Grandfather 48       deceased 30    Social History Social History   Tobacco Use   Smoking status: Former    Packs/day: 1.00    Years: 15.00    Pack years: 15.00    Types: Cigarettes    Quit date: 02/21/2015    Years since quitting: 6.3   Smokeless tobacco: Never  Vaping Use   Vaping Use: Former   Quit date: 01/01/2019  Substance Use Topics   Alcohol use: Yes    Alcohol/week: 2.0 standard drinks    Types: 2 Cans of beer per week    Comment: Occasional    Drug use: No     Allergies   Patient has no known allergies.   Review of Systems Review of Systems  Constitutional:  Positive for appetite change and fatigue. Negative for activity change, chills, diaphoresis and fever.  HENT:  Positive for postnasal drip and rhinorrhea. Negative for congestion, ear discharge, ear pain and sore throat.   Eyes:  Negative for discharge.  Respiratory:  Positive for cough and chest tightness. Negative for shortness of  breath.   Cardiovascular:  Negative for chest pain.  Musculoskeletal:  Positive for myalgias. Negative for gait problem.  Skin:  Negative for rash.  Neurological:  Positive for headaches.  Hematological:  Negative for adenopathy.    Physical Exam Triage Vital Signs ED Triage Vitals  Enc Vitals Group     BP 06/16/21 1023 125/83     Pulse Rate 06/16/21 1023 71  Resp 06/16/21 1023 17     Temp 06/16/21 1023 98 F (36.7 C)     Temp Source 06/16/21 1023 Oral     SpO2 06/16/21 1023 99 %     Weight 06/16/21 1022 150 lb (68 kg)     Height 06/16/21 1022 _0  (1.575 m)     Head Circumference --      Peak Flow --      Pain Score 06/16/21 1022 8     Pain Loc --      Pain Edu? --      Excl. in [place]? --    No data found.  Updated Vital Signs BP 125/83 (BP Location: Right Arm)   Pulse 71   Temp 98 F (36.7 C) (Oral)   Resp 17   Ht _1  (1.575 m)   Wt 150 lb (68 kg)   SpO2 99%   BMI 27.44 kg/m   Visual Acuity Right Eye Distance:   Left Eye Distance:   Bilateral Distance:    Right Eye Near:   Left Eye Near:    Bilateral Near:     Physical Exam Physical Exam Vitals signs and nursing note reviewed.  Constitutional:      General: She is not in acute distress.    Appearance: Normal appearance. She is not ill-appearing, toxic-appearing or diaphoretic.  HENT:     Head: Normocephalic.     Right Ear: Tympanic membrane, ear canal and external ear normal.     Left Ear: Tympanic membrane, ear canal and external ear normal.     Nose: Nose normal.     Mouth/Throat:     Mouth: Mucous membranes are moist.  Eyes:     General: No scleral icterus.       Right eye: No discharge.        Left eye: No discharge.     Conjunctiva/sclera: Conjunctivae normal.  Neck:     Musculoskeletal: Neck supple. No neck rigidity.  Cardiovascular:     Rate and Rhythm: Normal rate and regular rhythm.     Heart sounds: No murmur.  Pulmonary:     Effort: Pulmonary effort is normal. Has tender chest  wall which provoked the same pain she has been experiencing.     Breath sounds: Normal breath sounds.  Abdominal:     General: Bowel sounds are normal. There is no distension.     Palpations: Abdomen is soft. There is no mass.     Tenderness: There is no abdominal tenderness. There is no guarding or rebound.     Hernia: No hernia is present.  Musculoskeletal: Normal range of motion.  Lymphadenopathy:     Cervical: No cervical adenopathy.  Skin:    General: Skin is warm and dry.     Coloration: Skin is not jaundiced.     Findings: No rash.  Neurological:     Mental Status: She is alert and oriented to person, place, and time.     Gait: Gait normal.  Psychiatric:        Mood and Affect: Mood normal.        Behavior: Behavior normal.        Thought Content: Thought content normal.        Judgment: Judgment normal.   UC Treatments / Results  Labs (all labs ordered are listed, but only abnormal results are displayed) Labs Reviewed - No data to display  EKG   Radiology No results found.  Procedures Procedures (including critical  care time)  Medications Ordered in UC Medications - No data to display  Initial Impression / Assessment and Plan / UC Course  I have reviewed the triage vital signs and the nursing notes. Has covid infection and hx of cancer. I placed her on Monupiravir and Tessalon as noted. See instructions.     Final Clinical Impressions(s) / UC Diagnoses   Final diagnoses:  [id number] virus infection     Discharge Instructions      You may increase your Ibuprofen to 800 mg every 8 hours with food for head ache and body aches.   Stay quarantined for 5 days from the onset of symptoms, then keep a mask for another 5 days while around people you have not already been around.  Don't lay around, keep active and walk as much as you are able to to prevent worsening of your symptoms.  Follow up with your family Dr next week.  If you get short of breath and you  are able to check  your oxygen with a pulse oxygen meter, if it gets to 92% or less, you need to go to the hospital to be admitted. If you dont have one, come back here and we will assess you.        ED Prescriptions     Medication Sig Dispense Auth. Provider   Molnupiravir 200 MG CAPS Take 4 capsules (800 mg total) by mouth 2 (two) times daily. 40 capsule Rodriguez-Southworth, Alba Perillo, PA-C   benzonatate (TESSALON) 200 MG capsule Take 1 capsule (200 mg total) by mouth 2 (two) times daily as needed for cough. 30 capsule Rodriguez-Southworth, Sunday Spillers, PA-C      PDMP not reviewed this encounter.   Shelby Mattocks, Hershal Coria 06/16/21 1112

## 2021-06-16 NOTE — Discharge Instructions (Addendum)
You may increase your Ibuprofen to 800 mg every 8 hours with food for head ache and body aches.   Stay quarantined for 5 days from the onset of symptoms, then keep a mask for another 5 days while around people you have not already been around.  Don't lay around, keep active and walk as much as you are able to to prevent worsening of your symptoms.  Follow up with your family Dr next week.  If you get short of breath and you are able to check  your oxygen with a pulse oxygen meter, if it gets to 92% or less, you need to go to the hospital to be admitted. If you dont have one, come back here and we will assess you.

## 2021-06-16 NOTE — ED Triage Notes (Signed)
Patient states that she tested positive for covid on Thursday. Patient states that she has continued to have fatigue and body aches.

## 2021-08-13 ENCOUNTER — Other Ambulatory Visit: Payer: Self-pay | Admitting: Family Medicine

## 2021-09-11 ENCOUNTER — Other Ambulatory Visit: Payer: Self-pay | Admitting: Family Medicine

## 2021-09-26 ENCOUNTER — Inpatient Hospital Stay (HOSPITAL_BASED_OUTPATIENT_CLINIC_OR_DEPARTMENT_OTHER): Payer: BC Managed Care – PPO | Admitting: Nurse Practitioner

## 2021-09-26 ENCOUNTER — Inpatient Hospital Stay: Payer: BC Managed Care – PPO | Attending: Oncology

## 2021-09-26 ENCOUNTER — Telehealth: Payer: BC Managed Care – PPO | Admitting: Nurse Practitioner

## 2021-09-26 VITALS — BP 125/80 | HR 88 | Temp 97.6°F | Wt 153.1 lb

## 2021-09-26 DIAGNOSIS — D509 Iron deficiency anemia, unspecified: Secondary | ICD-10-CM | POA: Insufficient documentation

## 2021-09-26 DIAGNOSIS — Z08 Encounter for follow-up examination after completed treatment for malignant neoplasm: Secondary | ICD-10-CM | POA: Diagnosis not present

## 2021-09-26 DIAGNOSIS — Z853 Personal history of malignant neoplasm of breast: Secondary | ICD-10-CM

## 2021-09-26 LAB — CBC WITH DIFFERENTIAL/PLATELET
Abs Immature Granulocytes: 0.01 10*3/uL (ref 0.00–0.07)
Basophils Absolute: 0 10*3/uL (ref 0.0–0.1)
Basophils Relative: 0 %
Eosinophils Absolute: 0.3 10*3/uL (ref 0.0–0.5)
Eosinophils Relative: 4 %
HCT: 36.7 % (ref 36.0–46.0)
Hemoglobin: 12.2 g/dL (ref 12.0–15.0)
Immature Granulocytes: 0 %
Lymphocytes Relative: 23 %
Lymphs Abs: 1.6 10*3/uL (ref 0.7–4.0)
MCH: 31.5 pg (ref 26.0–34.0)
MCHC: 33.2 g/dL (ref 30.0–36.0)
MCV: 94.8 fL (ref 80.0–100.0)
Monocytes Absolute: 0.7 10*3/uL (ref 0.1–1.0)
Monocytes Relative: 9 %
Neutro Abs: 4.5 10*3/uL (ref 1.7–7.7)
Neutrophils Relative %: 64 %
Platelets: 487 10*3/uL — ABNORMAL HIGH (ref 150–400)
RBC: 3.87 MIL/uL (ref 3.87–5.11)
RDW: 13.2 % (ref 11.5–15.5)
WBC: 7 10*3/uL (ref 4.0–10.5)
nRBC: 0 % (ref 0.0–0.2)

## 2021-09-26 LAB — IRON AND TIBC
Iron: 39 ug/dL (ref 28–170)
Saturation Ratios: 10 % — ABNORMAL LOW (ref 10.4–31.8)
TIBC: 412 ug/dL (ref 250–450)
UIBC: 373 ug/dL

## 2021-09-26 LAB — FERRITIN: Ferritin: 6 ng/mL — ABNORMAL LOW (ref 11–307)

## 2021-09-26 NOTE — Progress Notes (Signed)
Hematology/Oncology Consult Note [facility]  Telephone:([phone] [phone]  Patient Care Team: Leone Haven, MD as PCP - General (Family Medicine) Noreene Filbert, MD as Radiation Oncologist (Radiation Oncology)   Name of the patient: Melinda Holder  [id number]  1971/10/28   Date of visit: 09/26/21  Diagnosis- stage IA invasive mammary carcinoma of the left breast grade 1 PT 1 APN 0CM0 ER PR positive HER-2/neu negative status post lumpectomy   2. Iron deficiency anemia  Chief complaint/ Reason for visit-routine follow-up of iron deficiency anemia and breast cancer  Heme/Onc history:  Patient is a 50 year old premenopausal woman who sees me for her iron deficiency anemia.  She has received Feraheme in the past last in March 2019 and her hemoglobin had improved.  Patient also has thrombocytosis dating back to 2017.  Jak 2 mutation testing has been negative.  Bone marrow biopsy not done at this time as she is less than 49 years of age with no prior history of thrombosis.   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  OS plus AI was recommended without adjuvant chemotherapy.  Patient could not tolerate Lupron and was therefore switched to tamoxifen.  Patient would also not tolerate tamoxifen due to worsening headaches and stopped taking it.  Currently patient is not taking any antihormonal therapy.   For iron deficiency anemia she has been getting periodic Feraheme    Interval history- Patient is 50 year old female who returns to clinic for labs, further evaluation, and continued surveillance of breast cancer and iron deficiency. She endorses fatigue. Denies any neurologic complaints. Denies recent fevers or illnesses. Denies any easy bleeding or bruising. No  melena or hematochezia. No pica or restless leg. Reports good appetite and denies weight loss. Denies chest pain. Denies any nausea, vomiting, constipation, or diarrhea. Denies urinary complaints. Patient offers no further specific complaints today. No new breast lumps, bumps, or skin changes.   ECOG PS- 0 Pain scale- 0   Review of systems- Review of Systems  Constitutional:  Negative for chills, fever, malaise/fatigue and weight loss.  HENT:  Negative for congestion, ear discharge and nosebleeds.   Eyes:  Negative for blurred vision.  Respiratory:  Negative for cough, hemoptysis, sputum production, shortness of breath and wheezing.   Cardiovascular:  Negative for chest pain, palpitations, orthopnea and claudication.  Gastrointestinal:  Negative for abdominal pain, blood in stool, constipation, diarrhea, heartburn, melena, nausea and vomiting.  Genitourinary:  Negative for dysuria, flank pain, frequency, hematuria and urgency.  Musculoskeletal:  Negative for back pain, joint pain and myalgias.  Skin:  Negative for rash.  Neurological:  Negative for dizziness, tingling, focal weakness, seizures, weakness and headaches.  Endo/Heme/Allergies:  Does not bruise/bleed easily.  Psychiatric/Behavioral:  Negative for depression and suicidal ideas. The patient does not have insomnia.      No Known Allergies   Past Medical History:  Diagnosis Date   Amblyopia of eye, right    Anemia    Asthma    childhood   Breast cancer ([place]) 2019   Cancer ([place]) 10/27/2018   left breast   Difficult intubation 11/03/2016   DL x 2 with MAC 3 (CRNA and MD), very anterior. DL x 2 with Glidescope #3 Lo pro, bougie utilized. Easy mask.   Endometriosis    GERD (gastroesophageal  reflux disease)    Migraine    Personal history of radiation therapy      Past Surgical History:  Procedure Laterality Date   ABLATION ON ENDOMETRIOSIS     BREAST BIOPSY Left 10/28/2018   INVASIVE MAMMARY CARCINOMA   BREAST  EXCISIONAL BIOPSY Left 10/28/2018   BREAST LUMPECTOMY Left 11/17/2018   INVASIVE MAMMARY CARCINOMA   BREAST LUMPECTOMY Left 12/19/2018   re-excision Procedure: BREAST LUMPECTOMY;  Surgeon: Jules Husbands, MD;  Location: [facility];  Service: General;  Laterality: Left;   BREAST LUMPECTOMY WITH NEEDLE LOCALIZATION Left 11/17/2018   Procedure: BREAST LUMPECTOMY WITH NEEDLE LOCALIZATION;  Surgeon: Jules Husbands, MD;  Location: [facility];  Service: General;  Laterality: Left;   CLAVICLE SURGERY Left 10/2016   COLONOSCOPY WITH PROPOFOL N/A 06/27/2018   Procedure: COLONOSCOPY WITH PROPOFOL;  Surgeon: Lucilla Lame, MD;  Location: [place];  Service: Endoscopy;  Laterality: N/A;   ESOPHAGOGASTRODUODENOSCOPY (EGD) WITH PROPOFOL N/A 06/27/2018   Procedure: ESOPHAGOGASTRODUODENOSCOPY (EGD) WITH PROPOFOL;  Surgeon: Lucilla Lame, MD;  Location: [place];  Service: Endoscopy;  Laterality: N/A;   EYE MUSCLE SURGERY Right    child   ORIF CLAVICULAR FRACTURE Left 11/03/2016   Procedure: OPEN REDUCTION INTERNAL FIXATION (ORIF) CLAVICULAR FRACTURE;  Surgeon: Corky Mull, MD;  Location: [facility];  Service: Orthopedics;  Laterality: Left;   removal of fibroid     SENTINEL NODE BIOPSY Left 11/17/2018   Procedure: SENTINEL NODE BIOPSY;  Surgeon: Jules Husbands, MD;  Location: [facility];  Service: General;  Laterality: Left;    Social History   Socioeconomic History   Marital status: Married    Spouse name: 0   Number of children: 0   Years of education: Not on file   Highest education level: Associate degree: academic program  Occupational History   Occupation: Librarian, academic    Comment: [employer]  Tobacco Use   Smoking status: Former    Packs/day: 1.00    Years: 15.00    Pack years: 15.00    Types: Cigarettes    Quit date: 02/21/2015    Years since quitting: 6.6   Smokeless tobacco: Never  Vaping Use   Vaping Use: Former   Quit date: 01/01/2019  Substance and Sexual Activity    Alcohol use: Yes    Alcohol/week: 2.0 standard drinks    Types: 2 Cans of beer per week    Comment: Occasional    Drug use: No   Sexual activity: Yes    Partners: Male    Comment: 1 female  Other Topics Concern   Not on file  Social History Narrative   Works- Engineer, 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    Children- 4 step children    Pets: None    Caffeine- Tea 3 cups and Coffee 2 cups    Social Determinants of Health   Financial Resource Strain: Not on file  Food Insecurity: Not on file  Transportation Needs: Not on file  Physical Activity: Not on file  Stress: Not on file  Social Connections: Not on file  Intimate Partner Violence: Not on file    Family History  Problem Relation Age of Onset   Lupus Mother    Hypertension Mother    Lymphoma Mother 40       B cell; currently 25   Hypertension Father        currently 74   Diabetes Father    Breast cancer Paternal Aunt  39       deceased 58   Lung cancer Paternal Aunt 62       deceased 21   Brain cancer Paternal Grandfather 48       deceased 51     Current Outpatient Medications:    hydrOXYzine (ATARAX/VISTARIL) 10 MG tablet, TAKE 1 TABLET BY MOUTH EVERYDAY AT BEDTIME, Disp: 30 tablet, Rfl: 8   ibuprofen (ADVIL) 600 MG tablet, Take 1 tablet (600 mg total) by mouth every 6 (six) hours as needed., Disp: 30 tablet, Rfl: 0   benzonatate (TESSALON) 200 MG capsule, Take 1 capsule (200 mg total) by mouth 2 (two) times daily as needed for cough., Disp: 30 capsule, Rfl: 0   Molnupiravir 200 MG CAPS, Take 4 capsules (800 mg total) by mouth 2 (two) times daily., Disp: 40 capsule, Rfl: 0   polyethylene glycol (MIRALAX / GLYCOLAX) packet, Take 17 g by mouth daily as needed for moderate constipation. (Patient not taking: No sig reported), Disp: , Rfl:   Physical exam:  Vitals:   09/26/21 1044  BP: 125/80  Pulse: 88  Temp: 97.6 F (36.4 C)  SpO2: 99%  Weight: 153 lb 1.6 oz (69.4 kg)   Physical  Exam Constitutional:      General: She is not in acute distress. Eyes:     Pupils: Pupils are equal, round, and reactive to light.  Cardiovascular:     Rate and Rhythm: Normal rate and regular rhythm.     Heart sounds: Normal heart sounds.  Pulmonary:     Effort: Pulmonary effort is normal.     Breath sounds: Normal breath sounds.  Skin:    General: Skin is warm and dry.  Neurological:     Mental Status: She is alert and oriented to person, place, and time.  Breast exam was performed in seated and lying down position. Patient is status post left lumpectomy with a well-healed surgical scar. No evidence of any palpable masses. No evidence of axillary adenopathy. No evidence of any palpable masses or lumps in the right breast. No evidence of right axillary adenopathy    CMP Latest Ref Rng & Units 11/11/2020  Glucose 70 - 99 mg/dL 82  BUN 6 - 23 mg/dL 19  Creatinine 0.40 - 1.20 mg/dL 0.85  Sodium 135 - 145 mEq/L 138  Potassium 3.5 - 5.1 mEq/L 3.9  Chloride 96 - 112 mEq/L 103  CO2 19 - 32 mEq/L 29  Calcium 8.4 - 10.5 mg/dL 9.2  Total Protein 6.0 - 8.3 g/dL 7.2  Total Bilirubin 0.2 - 1.2 mg/dL 0.2  Alkaline Phos 39 - 117 U/L 46  AST 0 - 37 U/L 17  ALT 0 - 35 U/L 11   CBC Latest Ref Rng & Units 09/26/2021  WBC 4.0 - 10.5 K/uL 7.0  Hemoglobin 12.0 - 15.0 g/dL 12.2  Hematocrit 36.0 - 46.0 % 36.7  Platelets 150 - 400 K/uL 487(H)    Assessment and plan- Patient is a 50 y.o. female who is here for follow-up of following issues:  1.  Stage I left breast cancer: She is s/p surgery and adjuvant radiation therapy.  Not presently on hormone therapy due to intolerance to tamoxifen.  She would be due for bilateral mammogram in October 2022 which I will schedule.  Clinically she is doing well with no concerning signs and symptoms of recurrence based on today's exam.  2.  Iron deficiency anemia: Ferritin persistently low at 6, iron saturation has also dropped. She is not  presently anemic but  feel she would benefit from IV iron to replace her iron stores. Benefits and risks reviewed including small risk of allergic reaction or hypersensitivity. Will plan for venofer x 5. Etiology of her IDA is unclear. Previously had EGD and colonoscopy with Dr. Eleina Jergens Norris in 2019 which were unremarkable. Will discuss having her see GI at next visit.   Mammogram in October Venofer x 5 3 mo- cbc, ferritin, iron studies, see Dr. Janese Banks virtually day to week later   Visit Diagnosis 1. Encounter for follow-up surveillance of breast cancer   2. Iron deficiency anemia, unspecified iron deficiency anemia type     Beckey Rutter, DNP, AGNP-C [place] at [facility] [phone] (clinic) 09/26/2021

## 2021-09-26 NOTE — Progress Notes (Signed)
Pt has no concerns/complaints at this time. 

## 2021-10-13 ENCOUNTER — Encounter: Payer: Self-pay | Admitting: Nurse Practitioner

## 2021-10-13 ENCOUNTER — Other Ambulatory Visit: Payer: Self-pay

## 2021-10-13 ENCOUNTER — Encounter: Payer: Self-pay | Admitting: Oncology

## 2021-10-13 ENCOUNTER — Ambulatory Visit
Admission: EM | Admit: 2021-10-13 | Discharge: 2021-10-13 | Disposition: A | Discharge status: Home / Self Care | Payer: BC Managed Care – PPO | Attending: Emergency Medicine | Admitting: Emergency Medicine

## 2021-10-13 DIAGNOSIS — J069 Acute upper respiratory infection, unspecified: Secondary | ICD-10-CM | POA: Diagnosis not present

## 2021-10-13 MED ORDER — IPRATROPIUM BROMIDE 0.06 % NA SOLN: 2.0000 | Freq: Four times a day (QID) | NASAL | 12 refills | Status: DC

## 2021-10-13 MED ORDER — PROMETHAZINE-DM 6.25-15 MG/5ML PO SYRP: 5.0000 mL | ORAL_SOLUTION | Freq: Four times a day (QID) | ORAL | 0 refills | Status: DC | PRN

## 2021-10-13 MED ORDER — BENZONATATE 100 MG PO CAPS: 200.0000 mg | ORAL_CAPSULE | Freq: Three times a day (TID) | ORAL | 0 refills | Status: DC

## 2021-10-13 NOTE — ED Triage Notes (Signed)
Pt here with C/O of cough for 2 weeks,

## 2021-10-13 NOTE — ED Provider Notes (Signed)
[facility]    CSN: [id number] Arrival date & time: 10/13/21  1736      History   Chief Complaint No chief complaint on file.   HPI Melinda Holder is a 50 y.o. female.   HPI  50 year old female here for evaluation of respiratory complaints.  Patient reports that she has been experiencing a cough for the past 2 weeks that is intermittently productive for a clear sputum.  The cough is worse at night.  She does endorse runny nose and nasal congestion along with some mild shortness of breath and wheezing at nighttime only.  This has not been associated with fever, sore throat, or GI complaints.  Patient denies any sick contacts.  She has been using DayQuil with some mild relief of symptoms.  Past Medical History:  Diagnosis Date   Amblyopia of eye, right    Anemia    Asthma    childhood   Breast cancer ([place]) 2019   Cancer ([place]) 10/27/2018   left breast   Difficult intubation 11/03/2016   DL x 2 with MAC 3 (CRNA and MD), very anterior. DL x 2 with Glidescope #3 Lo pro, bougie utilized. Easy mask.   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)    Malignant neoplasm of left female breast ([place]) 11/03/2018   Acute gastritis without hemorrhage    Acute duodenitis    Other specified diseases of intestine    Elevated glucose 06/07/2018   Enteritis 06/30/2017   Malrotation of intestine    Ankle swelling, right 05/20/2017   Iron deficiency anemia 05/20/2017   Constipation 05/20/2017   Encounter for general adult medical examination with abnormal findings 02/27/2016    Pruritus 09/03/2015   Overweight (BMI 25.0-29.9) 09/03/2015   Adnexal pain 11/18/2012    Past Surgical History:  Procedure Laterality Date   ABLATION ON ENDOMETRIOSIS     BREAST BIOPSY Left 10/28/2018   INVASIVE MAMMARY CARCINOMA   BREAST EXCISIONAL BIOPSY Left 10/28/2018   BREAST LUMPECTOMY Left 11/17/2018   INVASIVE MAMMARY CARCINOMA   BREAST LUMPECTOMY Left 12/19/2018   re-excision Procedure: BREAST LUMPECTOMY;  Surgeon: Jules Husbands, MD;  Location: [facility];  Service: General;  Laterality: Left;   BREAST LUMPECTOMY WITH NEEDLE LOCALIZATION Left 11/17/2018   Procedure: BREAST LUMPECTOMY WITH NEEDLE LOCALIZATION;  Surgeon: Jules Husbands, MD;  Location: [facility];  Service: General;  Laterality: Left;   CLAVICLE SURGERY Left 10/2016   COLONOSCOPY WITH PROPOFOL N/A 06/27/2018   Procedure: COLONOSCOPY WITH PROPOFOL;  Surgeon: Lucilla Lame, MD;  Location: [place];  Service: Endoscopy;  Laterality: N/A;   ESOPHAGOGASTRODUODENOSCOPY (EGD) WITH PROPOFOL N/A 06/27/2018   Procedure: ESOPHAGOGASTRODUODENOSCOPY (EGD) WITH PROPOFOL;  Surgeon: Lucilla Lame, MD;  Location: [place];  Service: Endoscopy;  Laterality: N/A;   EYE MUSCLE SURGERY Right    child   ORIF CLAVICULAR FRACTURE Left 11/03/2016   Procedure: OPEN REDUCTION INTERNAL FIXATION (ORIF) CLAVICULAR FRACTURE;  Surgeon: Corky Mull, MD;  Location: [facility];  Service: Orthopedics;  Laterality: Left;   removal of fibroid     SENTINEL NODE  BIOPSY Left 11/17/2018   Procedure: SENTINEL NODE BIOPSY;  Surgeon: Jules Husbands, MD;  Location: [facility];  Service: General;  Laterality: Left;    OB History     Gravida  2   Para  0   Term  0   Preterm  0   AB  2   Living  0      SAB  0   IAB  0   Ectopic  0   Multiple  0   Live Births  0        Obstetric Comments  1st Menstrual Cycle:  14             Home Medications    Prior to Admission medications   Medication Sig Start Date End Date  Taking? Authorizing Provider  benzonatate (TESSALON) 100 MG capsule Take 2 capsules (200 mg total) by mouth every 8 (eight) hours. 10/13/21  Yes Margarette Canada, NP  hydrOXYzine (ATARAX/VISTARIL) 10 MG tablet TAKE 1 TABLET BY MOUTH EVERYDAY AT BEDTIME 08/13/21  Yes Leone Haven, MD  ibuprofen (ADVIL) 600 MG tablet Take 1 tablet (600 mg total) by mouth every 6 (six) hours as needed. 02/18/21  Yes Melynda Ripple, MD  ipratropium (ATROVENT) 0.06 % nasal spray Place 2 sprays into both nostrils 4 (four) times daily. 10/13/21  Yes Margarette Canada, NP  polyethylene glycol (MIRALAX / GLYCOLAX) packet Take 17 g by mouth daily as needed for moderate constipation.   Yes [provider]  promethazine-dextromethorphan (PROMETHAZINE-DM) 6.25-15 MG/5ML syrup Take 5 mLs by mouth 4 (four) times daily as needed. 10/13/21  Yes Margarette Canada, NP  fluticasone (FLONASE) 50 MCG/ACT nasal spray Place 2 sprays into both nostrils daily. 08/26/20 02/18/21  Melynda Ripple, MD    Family History Family History  Problem Relation Age of Onset   Lupus Mother    Hypertension Mother    Lymphoma Mother 60       B cell; currently 24   Hypertension Father        currently 30   Diabetes Father    Breast cancer Paternal Aunt 35       deceased 15   Lung cancer Paternal Aunt 30       deceased 60   Brain cancer Paternal Grandfather 33       deceased 98    Social History Social History   Tobacco Use   Smoking status: Former    Packs/day: 1.00    Years: 15.00    Pack years: 15.00    Types: Cigarettes    Quit date: 02/21/2015    Years since quitting: 6.6   Smokeless tobacco: Never  Vaping Use   Vaping Use: Former   Quit date: 01/01/2019  Substance Use Topics   Alcohol use: Yes    Alcohol/week: 2.0 standard drinks    Types: 2 Cans of beer per week    Comment: Occasional    Drug use: No     Allergies   Patient has no known allergies.   Review of Systems Review of Systems  Constitutional:  Negative  for activity change, appetite change and fever.  HENT:  Positive for congestion and rhinorrhea. Negative for ear pain and sore throat.   Respiratory:  Positive for cough, shortness of breath and wheezing.   Gastrointestinal:  Negative for diarrhea, nausea and vomiting.  Skin:  Negative for rash.  Hematological: Negative.   Psychiatric/Behavioral: Negative.      Physical Exam Triage Vital Signs ED Triage  Vitals  Enc Vitals Group     BP 10/13/21 1902 140/86     Pulse Rate 10/13/21 1902 65     Resp 10/13/21 1902 18     Temp 10/13/21 1902 98.3 F (36.8 C)     Temp Source 10/13/21 1902 Oral     SpO2 10/13/21 1902 99 %     Weight 10/13/21 1901 150 lb (68 kg)     Height 10/13/21 1901 _0  (1.575 m)     Head Circumference --      Peak Flow --      Pain Score 10/13/21 1901 0     Pain Loc --      Pain Edu? --      Excl. in [place]? --    No data found.  Updated Vital Signs BP 140/86 (BP Location: Left Arm)   Pulse 65   Temp 98.3 F (36.8 C) (Oral)   Resp 18   Ht _1  (1.575 m)   Wt 150 lb (68 kg)   SpO2 99%   BMI 27.44 kg/m   Visual Acuity Right Eye Distance:   Left Eye Distance:   Bilateral Distance:    Right Eye Near:   Left Eye Near:    Bilateral Near:     Physical Exam Vitals and nursing note reviewed.  Constitutional:      General: She is not in acute distress.    Appearance: Normal appearance. She is not ill-appearing.  HENT:     Head: Normocephalic and atraumatic.     Right Ear: Tympanic membrane, ear canal and external ear normal. There is no impacted cerumen.     Left Ear: Tympanic membrane, ear canal and external ear normal. There is no impacted cerumen.     Nose: Congestion and rhinorrhea present.     Mouth/Throat:     Mouth: Mucous membranes are moist.     Pharynx: Oropharynx is clear. Posterior oropharyngeal erythema present.  Cardiovascular:     Rate and Rhythm: Normal rate and regular rhythm.     Pulses: Normal pulses.     Heart sounds: Normal  heart sounds. No murmur heard.   No gallop.  Pulmonary:     Effort: Pulmonary effort is normal.     Breath sounds: Normal breath sounds. No wheezing, rhonchi or rales.  Musculoskeletal:     Cervical back: Normal range of motion and neck supple.  Lymphadenopathy:     Cervical: No cervical adenopathy.  Skin:    General: Skin is warm and dry.     Capillary Refill: Capillary refill takes less than 2 seconds.     Findings: No erythema or rash.  Neurological:     General: No focal deficit present.     Mental Status: She is alert and oriented to person, place, and time.  Psychiatric:        Mood and Affect: Mood normal.        Behavior: Behavior normal.        Thought Content: Thought content normal.        Judgment: Judgment normal.     UC Treatments / Results  Labs (all labs ordered are listed, but only abnormal results are displayed) Labs Reviewed - No data to display  EKG   Radiology No results found.  Procedures Procedures (including critical care time)  Medications Ordered in UC Medications - No data to display  Initial Impression / Assessment and Plan / UC Course  I have reviewed the triage vital  signs and the nursing notes.  Pertinent labs & imaging results that were available during my care of the patient were reviewed by me and considered in my medical decision making (see chart for details).  Patient is a very pleasant, nontoxic-appearing 50 year old female here for evaluation of respiratory complaints as outlined in HPI above.  Patient's physical exam reveals pearly gray tympanic membranes bilaterally with a normal light reflex and clear external auditory canals.  Nasal mucosa is erythematous and edematous with clear nasal discharge in both nares.  Oropharyngeal exam reveals posterior oropharyngeal erythema with clear postnasal drip.  No cervical lymphadenopathy appreciated on exam.  Cardiopulmonary exam reveals clear lung sounds in all fields.  Patient exam is  consistent with viral URI with cough.  Patient does also endorse having some allergy symptoms but not currently taking thing for allergies.  I have advised patient to start taking Claritin, Zyrtec, or Allegra daily to help with allergy symptoms and will prescribe Atrovent nasal spray to help with nasal congestion, runny nose, and postnasal drip which I believe is feeding her cough.  We will also give Tessalon Perles for use for cough during the day and Promethazine DM cough syrup for use at night.   Final Clinical Impressions(s) / UC Diagnoses   Final diagnoses:  Viral URI with cough     Discharge Instructions      Use the Atrovent nasal spray, 2 squirts in each nostril every 6 hours, as needed for runny nose and postnasal drip.  Use the Tessalon Perles every 8 hours during the day.  Take them with a small sip of water.  They may give you some numbness to the base of your tongue or a metallic taste in your mouth, this is normal.  Use the Promethazine DM cough syrup at bedtime for cough and congestion.  It will make you drowsy so do not take it during the day.  Take Claritin, Zyrtec, or Allegra daily to help with control of allergy symptoms.   Return for reevaluation or see your primary care provider for any new or worsening symptoms.      ED Prescriptions     Medication Sig Dispense Auth. Provider   benzonatate (TESSALON) 100 MG capsule Take 2 capsules (200 mg total) by mouth every 8 (eight) hours. 21 capsule Margarette Canada, NP   ipratropium (ATROVENT) 0.06 % nasal spray Place 2 sprays into both nostrils 4 (four) times daily. 15 mL Margarette Canada, NP   promethazine-dextromethorphan (PROMETHAZINE-DM) 6.25-15 MG/5ML syrup Take 5 mLs by mouth 4 (four) times daily as needed. 118 mL Margarette Canada, NP      PDMP not reviewed this encounter.   Margarette Canada, NP 10/13/21 1929

## 2021-10-13 NOTE — Discharge Instructions (Addendum)
Use the Atrovent nasal spray, 2 squirts in each nostril every 6 hours, as needed for runny nose and postnasal drip.  Use the Tessalon Perles every 8 hours during the day.  Take them with a small sip of water.  They may give you some numbness to the base of your tongue or a metallic taste in your mouth, this is normal.  Use the Promethazine DM cough syrup at bedtime for cough and congestion.  It will make you drowsy so do not take it during the day.  Take Claritin, Zyrtec, or Allegra daily to help with control of allergy symptoms.   Return for reevaluation or see your primary care provider for any new or worsening symptoms.

## 2021-10-27 ENCOUNTER — Other Ambulatory Visit: Payer: Self-pay

## 2021-10-27 ENCOUNTER — Ambulatory Visit
Admission: RE | Admit: 2021-10-27 | Discharge: 2021-10-27 | Disposition: A | Discharge status: Home / Self Care | Payer: BC Managed Care – PPO | Source: Ambulatory Visit | Attending: Oncology | Admitting: Oncology

## 2021-10-27 DIAGNOSIS — Z1231 Encounter for screening mammogram for malignant neoplasm of breast: Secondary | ICD-10-CM | POA: Diagnosis not present

## 2021-10-27 DIAGNOSIS — C50912 Malignant neoplasm of unspecified site of left female breast: Secondary | ICD-10-CM

## 2021-10-28 ENCOUNTER — Inpatient Hospital Stay: Payer: BC Managed Care – PPO | Attending: Oncology

## 2021-10-28 VITALS — BP 110/76 | HR 69 | Temp 98.6°F | Resp 18

## 2021-10-28 DIAGNOSIS — D509 Iron deficiency anemia, unspecified: Secondary | ICD-10-CM | POA: Diagnosis not present

## 2021-10-28 MED ORDER — SODIUM CHLORIDE 0.9 % IV SOLN
Freq: Once | INTRAVENOUS | Status: AC
  Filled 2021-10-28: qty 250

## 2021-10-28 MED ORDER — SODIUM CHLORIDE 0.9 % IV SOLN: 200.0000 mg | Freq: Once | INTRAVENOUS | Status: DC

## 2021-10-28 MED ORDER — IRON SUCROSE 20 MG/ML IV SOLN
200.0000 mg | Freq: Once | INTRAVENOUS | Status: AC
  Administered 2021-10-28: 200 mg via INTRAVENOUS
  Filled 2021-10-28: qty 10

## 2021-10-30 ENCOUNTER — Inpatient Hospital Stay: Payer: BC Managed Care – PPO

## 2021-10-30 ENCOUNTER — Other Ambulatory Visit: Payer: Self-pay

## 2021-10-30 VITALS — BP 117/76 | HR 78 | Temp 98.9°F | Resp 18

## 2021-10-30 DIAGNOSIS — D509 Iron deficiency anemia, unspecified: Secondary | ICD-10-CM

## 2021-10-30 MED ORDER — IRON SUCROSE 20 MG/ML IV SOLN
200.0000 mg | Freq: Once | INTRAVENOUS | Status: AC
  Administered 2021-10-30: 200 mg via INTRAVENOUS
  Filled 2021-10-30: qty 10

## 2021-10-30 MED ORDER — SODIUM CHLORIDE 0.9 % IV SOLN
Freq: Once | INTRAVENOUS | Status: AC
  Filled 2021-10-30: qty 250

## 2021-10-30 MED ORDER — SODIUM CHLORIDE 0.9 % IV SOLN: 200.0000 mg | Freq: Once | INTRAVENOUS | Status: DC

## 2021-11-04 ENCOUNTER — Inpatient Hospital Stay: Payer: BC Managed Care – PPO

## 2021-11-04 ENCOUNTER — Other Ambulatory Visit: Payer: Self-pay

## 2021-11-04 VITALS — BP 120/80 | HR 71 | Temp 97.3°F | Resp 18

## 2021-11-04 DIAGNOSIS — D509 Iron deficiency anemia, unspecified: Secondary | ICD-10-CM | POA: Diagnosis not present

## 2021-11-04 MED ORDER — SODIUM CHLORIDE 0.9 % IV SOLN: 200.0000 mg | Freq: Once | INTRAVENOUS | Status: DC

## 2021-11-04 MED ORDER — SODIUM CHLORIDE 0.9 % IV SOLN
Freq: Once | INTRAVENOUS | Status: AC
  Filled 2021-11-04: qty 250

## 2021-11-04 MED ORDER — IRON SUCROSE 20 MG/ML IV SOLN
200.0000 mg | Freq: Once | INTRAVENOUS | Status: AC
  Administered 2021-11-04: 200 mg via INTRAVENOUS
  Filled 2021-11-04: qty 10

## 2021-11-06 ENCOUNTER — Other Ambulatory Visit: Payer: Self-pay

## 2021-11-06 ENCOUNTER — Inpatient Hospital Stay: Payer: BC Managed Care – PPO

## 2021-11-06 VITALS — BP 114/79 | HR 76 | Temp 97.1°F | Resp 18

## 2021-11-06 DIAGNOSIS — D509 Iron deficiency anemia, unspecified: Secondary | ICD-10-CM | POA: Diagnosis not present

## 2021-11-06 MED ORDER — IRON SUCROSE 20 MG/ML IV SOLN
200.0000 mg | Freq: Once | INTRAVENOUS | Status: AC
  Administered 2021-11-06: 200 mg via INTRAVENOUS
  Filled 2021-11-06: qty 10

## 2021-11-06 MED ORDER — SODIUM CHLORIDE 0.9 % IV SOLN: 200.0000 mg | Freq: Once | INTRAVENOUS | Status: DC

## 2021-11-06 MED ORDER — SODIUM CHLORIDE 0.9 % IV SOLN
Freq: Once | INTRAVENOUS | Status: AC
  Filled 2021-11-06: qty 250

## 2021-11-10 ENCOUNTER — Other Ambulatory Visit: Payer: Self-pay

## 2021-11-10 ENCOUNTER — Encounter: Payer: Self-pay | Admitting: Emergency Medicine

## 2021-11-10 ENCOUNTER — Ambulatory Visit
Admission: EM | Admit: 2021-11-10 | Discharge: 2021-11-10 | Disposition: A | Discharge status: Home / Self Care | Payer: BC Managed Care – PPO | Attending: Emergency Medicine | Admitting: Emergency Medicine

## 2021-11-10 ENCOUNTER — Telehealth: Payer: Self-pay | Admitting: Oncology

## 2021-11-10 DIAGNOSIS — R6889 Other general symptoms and signs: Secondary | ICD-10-CM

## 2021-11-10 MED ORDER — IPRATROPIUM BROMIDE 0.06 % NA SOLN: 2.0000 | Freq: Four times a day (QID) | NASAL | 12 refills | Status: DC

## 2021-11-10 MED ORDER — OSELTAMIVIR PHOSPHATE 75 MG PO CAPS: 75.0000 mg | ORAL_CAPSULE | Freq: Two times a day (BID) | ORAL | 0 refills | Status: DC

## 2021-11-10 NOTE — Discharge Instructions (Signed)
Take the Tamiflu twice daily for 5 days for treatment of influenza.  Use the Atrovent nasal spray, 2 squirts up each nostril every 6 hours, as needed for nasal congestion and runny nose.  Use over-the-counter Delsym, Zarbee's, or Robitussin during the day as needed for cough.  Take OTC Tylenol and Ibuprofen as needed for body aches and fever.   Return for reevaluation, or see your primary care provider, for new or worsening symptoms.

## 2021-11-10 NOTE — Telephone Encounter (Signed)
Pt called to cancel her appt for 11-15. She is sick. Please call back at [phone]

## 2021-11-10 NOTE — ED Provider Notes (Signed)
[facility]    CSN: [id number] Arrival date & time: 11/10/21  1549      History   Chief Complaint Chief Complaint  Patient presents with   Generalized Body Aches    HPI SHUNTAE HERZIG is a 50 y.o. female.   HPI  50 year old female here for evaluation of respiratory complaints.  Patient reports that her symptoms started this morning and consist of fever with a T-max of 102, headache, ear pain, sore throat, facial pressure, GI complaints, and body aches.  She denies any nasal discharge or cough.  She is also unaware of being around any sick contacts.  Past Medical History:  Diagnosis Date   Amblyopia of eye, right    Anemia    Asthma    childhood   Breast cancer ([place]) 2019   Cancer ([place]) 10/27/2018   left breast   Difficult intubation 11/03/2016   DL x 2 with MAC 3 (CRNA and MD), very anterior. DL x 2 with Glidescope #3 Lo pro, bougie utilized. Easy mask.   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)    Malignant neoplasm of left female breast ([place]) 11/03/2018   Acute gastritis without hemorrhage    Acute duodenitis    Other specified diseases of intestine    Elevated glucose 06/07/2018   Enteritis 06/30/2017   Malrotation of intestine    Ankle swelling, right 05/20/2017   Iron deficiency anemia 05/20/2017   Constipation 05/20/2017   Encounter for general adult medical examination with abnormal findings 02/27/2016   Pruritus 09/03/2015   Overweight (BMI 25.0-29.9) 09/03/2015   Adnexal pain 11/18/2012    Past Surgical History:  Procedure Laterality Date    ABLATION ON ENDOMETRIOSIS     BREAST BIOPSY Left 10/28/2018   INVASIVE MAMMARY CARCINOMA   BREAST EXCISIONAL BIOPSY Left 10/28/2018   BREAST LUMPECTOMY Left 11/17/2018   INVASIVE MAMMARY CARCINOMA   BREAST LUMPECTOMY Left 12/19/2018   re-excision Procedure: BREAST LUMPECTOMY;  Surgeon: Jules Husbands, MD;  Location: [facility];  Service: General;  Laterality: Left;   BREAST LUMPECTOMY WITH NEEDLE LOCALIZATION Left 11/17/2018   Procedure: BREAST LUMPECTOMY WITH NEEDLE LOCALIZATION;  Surgeon: Jules Husbands, MD;  Location: [facility];  Service: General;  Laterality: Left;   CLAVICLE SURGERY Left 10/2016   COLONOSCOPY WITH PROPOFOL N/A 06/27/2018   Procedure: COLONOSCOPY WITH PROPOFOL;  Surgeon: Lucilla Lame, MD;  Location: [place];  Service: Endoscopy;  Laterality: N/A;   ESOPHAGOGASTRODUODENOSCOPY (EGD) WITH PROPOFOL N/A 06/27/2018   Procedure: ESOPHAGOGASTRODUODENOSCOPY (EGD) WITH PROPOFOL;  Surgeon: Lucilla Lame, MD;  Location: [place];  Service: Endoscopy;  Laterality: N/A;   EYE MUSCLE SURGERY Right    child   ORIF CLAVICULAR FRACTURE Left 11/03/2016   Procedure: OPEN REDUCTION INTERNAL FIXATION (ORIF) CLAVICULAR FRACTURE;  Surgeon: Corky Mull, MD;  Location: [facility];  Service: Orthopedics;  Laterality: Left;   removal of fibroid     SENTINEL NODE BIOPSY Left 11/17/2018   Procedure: SENTINEL NODE BIOPSY;  Surgeon: Jules Husbands, MD;  Location: [facility];  Service: General;  Laterality: Left;  OB History     Gravida  2   Para  0   Term  0   Preterm  0   AB  2   Living  0      SAB  0   IAB  0   Ectopic  0   Multiple  0   Live Births  0        Obstetric Comments  1st Menstrual Cycle:  14             Home Medications    Prior to Admission medications   Medication Sig Start Date End Date Taking? Authorizing Provider  hydrOXYzine (ATARAX/VISTARIL) 10 MG tablet TAKE 1 TABLET BY MOUTH EVERYDAY AT BEDTIME 08/13/21  Yes Leone Haven, MD  ipratropium (ATROVENT) 0.06 % nasal spray Place 2 sprays into both nostrils 4 (four) times daily. 11/10/21  Yes Margarette Canada, NP  oseltamivir (TAMIFLU) 75 MG capsule Take 1 capsule (75 mg total) by mouth every 12 (twelve) hours. 11/10/21  Yes Margarette Canada, NP  promethazine-dextromethorphan (PROMETHAZINE-DM) 6.25-15 MG/5ML syrup Take 5 mLs by mouth 4 (four) times daily as needed. 10/13/21   Margarette Canada, NP  fluticasone (FLONASE) 50 MCG/ACT nasal spray Place 2 sprays into both nostrils daily. 08/26/20 02/18/21  Melynda Ripple, MD    Family History Family History  Problem Relation Age of Onset   Lupus Mother    Hypertension Mother    Lymphoma Mother 24       B cell; currently 39   Hypertension Father        currently 82   Diabetes Father    Breast cancer Paternal Aunt 75       deceased 14   Lung cancer Paternal Aunt 62       deceased 61   Brain cancer Paternal Grandfather 2       deceased 62    Social History Social History   Tobacco Use   Smoking status: Former    Packs/day: 1.00    Years: 15.00    Pack years: 15.00    Types: Cigarettes    Quit date: 02/21/2015    Years since quitting: 6.7   Smokeless tobacco: Never  Vaping Use   Vaping Use: Former   Quit date: 01/01/2019  Substance Use Topics   Alcohol use: Yes    Alcohol/week: 2.0 standard drinks    Types: 2 Cans of beer per week    Comment: Occasional    Drug use: No     Allergies   Patient has no known allergies.   Review of Systems Review of Systems  Constitutional:  Positive for fever. Negative for activity change and appetite change.  HENT:  Positive for congestion, ear pain and sore throat. Negative for rhinorrhea.   Respiratory:  Negative for cough.   Gastrointestinal:  Positive for diarrhea, nausea and vomiting.  Musculoskeletal:  Positive for arthralgias and myalgias.  Skin:  Negative for rash.  Neurological:  Positive for headaches.  Hematological: Negative.   Psychiatric/Behavioral:  Negative.      Physical Exam Triage Vital Signs ED Triage Vitals  Enc Vitals Group     BP 11/10/21 1653 128/82     Pulse Rate 11/10/21 1653 79     Resp 11/10/21 1653 18     Temp 11/10/21 1653 98.4 F (36.9 C)     Temp Source 11/10/21 1653 Oral     SpO2 11/10/21 1653 99 %     Weight 11/10/21 1651 150 lb (  68 kg)     Height 11/10/21 1651 _0  (1.575 m)     Head Circumference --      Peak Flow --      Pain Score 11/10/21 1651 9     Pain Loc --      Pain Edu? --      Excl. in [place]? --    No data found.  Updated Vital Signs BP 128/82 (BP Location: Left Arm)   Pulse 79   Temp 98.4 F (36.9 C) (Oral)   Resp 18   Ht _1  (1.575 m)   Wt 150 lb (68 kg)   SpO2 99%   BMI 27.44 kg/m   Visual Acuity Right Eye Distance:   Left Eye Distance:   Bilateral Distance:    Right Eye Near:   Left Eye Near:    Bilateral Near:     Physical Exam Vitals and nursing note reviewed.  Constitutional:      General: She is not in acute distress.    Appearance: Normal appearance. She is ill-appearing.  HENT:     Head: Normocephalic and atraumatic.     Right Ear: Tympanic membrane, ear canal and external ear normal. There is no impacted cerumen.     Left Ear: Tympanic membrane, ear canal and external ear normal. There is no impacted cerumen.     Nose: Congestion and rhinorrhea present.     Mouth/Throat:     Mouth: Mucous membranes are moist.     Pharynx: Oropharynx is clear. Posterior oropharyngeal erythema present.  Cardiovascular:     Rate and Rhythm: Normal rate and regular rhythm.     Pulses: Normal pulses.     Heart sounds: Normal heart sounds. No murmur heard.   No gallop.  Pulmonary:     Effort: Pulmonary effort is normal.     Breath sounds: Normal breath sounds. No wheezing, rhonchi or rales.  Musculoskeletal:     Cervical back: Normal range of motion and neck supple.  Lymphadenopathy:     Cervical: Cervical adenopathy present.  Skin:    General: Skin is warm and dry.      Capillary Refill: Capillary refill takes less than 2 seconds.     Findings: No erythema or rash.  Neurological:     General: No focal deficit present.     Mental Status: She is alert and oriented to person, place, and time.  Psychiatric:        Mood and Affect: Mood normal.        Behavior: Behavior normal.        Thought Content: Thought content normal.        Judgment: Judgment normal.     UC Treatments / Results  Labs (all labs ordered are listed, but only abnormal results are displayed) Labs Reviewed - No data to display  EKG   Radiology No results found.  Procedures Procedures (including critical care time)  Medications Ordered in UC Medications - No data to display  Initial Impression / Assessment and Plan / UC Course  I have reviewed the triage vital signs and the nursing notes.  Pertinent labs & imaging results that were available during my care of the patient were reviewed by me and considered in my medical decision making (see chart for details).  Is a very pleasant, though ill-appearing, 50 year old female here for evaluation of flulike symptoms that began today as outlined in HPI above.  Patient's physical exam reveals pearly gray tympanic membranes bilaterally with  normal light reflex and clear external auditory canals.  Nasal mucosa is erythematous and edematous with clear nasal discharge in both nares.  Oropharyngeal exam reveals posterior oropharyngeal erythema with clear postnasal drip.  Bilateral anterior cervical lymphadenopathy is present on exam.  Cardiopulmonary exam reveals clear lung sounds in all fields.  Patient exam is consistent with viral upper respiratory infection and possible influenza.  We will treat empirically for influenza with Tamiflu twice daily for 5 days and will provide Atrovent nasal spray to help with nasal congestion.  Patient is not having a cough at this present time.  Work note provided.   Final Clinical Impressions(s) / UC  Diagnoses   Final diagnoses:  Flu-like symptoms     Discharge Instructions      Take the Tamiflu twice daily for 5 days for treatment of influenza.  Use the Atrovent nasal spray, 2 squirts up each nostril every 6 hours, as needed for nasal congestion and runny nose.  Use over-the-counter Delsym, Zarbee's, or Robitussin during the day as needed for cough.  Take OTC Tylenol and Ibuprofen as needed for body aches and fever.   Return for reevaluation, or see your primary care provider, for new or worsening symptoms.      ED Prescriptions     Medication Sig Dispense Auth. Provider   oseltamivir (TAMIFLU) 75 MG capsule Take 1 capsule (75 mg total) by mouth every 12 (twelve) hours. 10 capsule Margarette Canada, NP   ipratropium (ATROVENT) 0.06 % nasal spray Place 2 sprays into both nostrils 4 (four) times daily. 15 mL Margarette Canada, NP      PDMP not reviewed this encounter.   Margarette Canada, NP 11/10/21 1739

## 2021-11-10 NOTE — ED Triage Notes (Signed)
Pt here with C/O body aches, headache, facial pain 1 days.

## 2021-11-11 ENCOUNTER — Inpatient Hospital Stay: Payer: BC Managed Care – PPO

## 2021-11-17 ENCOUNTER — Other Ambulatory Visit: Payer: Self-pay

## 2021-11-17 ENCOUNTER — Encounter: Payer: Self-pay | Admitting: Family Medicine

## 2021-11-17 ENCOUNTER — Ambulatory Visit (INDEPENDENT_AMBULATORY_CARE_PROVIDER_SITE_OTHER): Payer: BC Managed Care – PPO | Admitting: Family Medicine

## 2021-11-17 VITALS — BP 120/80 | HR 91 | Temp 98.0°F | Ht 62.5 in | Wt 149.0 lb

## 2021-11-17 DIAGNOSIS — Z0000 Encounter for general adult medical examination without abnormal findings: Secondary | ICD-10-CM | POA: Diagnosis not present

## 2021-11-17 DIAGNOSIS — Z13220 Encounter for screening for lipoid disorders: Secondary | ICD-10-CM | POA: Diagnosis not present

## 2021-11-17 DIAGNOSIS — Z23 Encounter for immunization: Secondary | ICD-10-CM

## 2021-11-17 DIAGNOSIS — E663 Overweight: Secondary | ICD-10-CM | POA: Diagnosis not present

## 2021-11-17 LAB — COMPREHENSIVE METABOLIC PANEL
ALT: 16 U/L (ref 0–35)
AST: 20 U/L (ref 0–37)
Albumin: 4.6 g/dL (ref 3.5–5.2)
Alkaline Phosphatase: 41 U/L (ref 39–117)
BUN: 15 mg/dL (ref 6–23)
CO2: 26 mEq/L (ref 19–32)
Calcium: 9.4 mg/dL (ref 8.4–10.5)
Chloride: 103 mEq/L (ref 96–112)
Creatinine, Ser: 0.63 mg/dL (ref 0.40–1.20)
GFR: 104.06 mL/min (ref >60.00)
Glucose, Bld: 95 mg/dL (ref 70–99)
Potassium: 4.2 mEq/L (ref 3.5–5.1)
Sodium: 137 mEq/L (ref 135–145)
Total Bilirubin: 0.3 mg/dL (ref 0.2–1.2)
Total Protein: 7.3 g/dL (ref 6.0–8.3)

## 2021-11-17 LAB — LIPID PANEL
Cholesterol: 192 mg/dL (ref 0–200)
HDL: 47 mg/dL (ref >39.00)
LDL Cholesterol: 118 mg/dL — ABNORMAL HIGH (ref 0–99)
NonHDL: 145.16
Total CHOL/HDL Ratio: 4
Triglycerides: 138 mg/dL (ref 0.0–149.0)
VLDL: 27.6 mg/dL (ref 0.0–40.0)

## 2021-11-17 LAB — HEMOGLOBIN A1C: Hgb A1c MFr Bld: 5.8 % (ref 4.6–6.5)

## 2021-11-17 NOTE — Assessment & Plan Note (Signed)
Physical exam completed.  Encouraged continued healthy exercise.  Discussed well-balanced diet.  Pap smear, mammogram, and colonoscopy are up-to-date.  I did discuss completing a urine pregnancy test given her lack of menses though she declined this given lack of sexual activity. She will follow-up with her GYN for breast and pelvic exams and to discuss her lack of a menstrual cycle.  I suspect this is related to perimenopause. She will be given a flu vaccine today.  Lab work as outlined.

## 2021-11-17 NOTE — Patient Instructions (Signed)
Nice to see. We will get lab work today. Please try to maintain healthy diet and exercise.

## 2021-11-17 NOTE — Progress Notes (Signed)
[name], MD Phone: [phone]  Melinda Holder is a 50 y.o. female who presents today for CPE.  Diet: a little bit of everything, not much junk or sweets, occasional sweet teat Exercise: walking 20 minutes daily Pap smear: 09/28/19 NILM negative HPV, follows with GYN Colonoscopy: 06/27/18 10 year recall Mammogram: 10/28/21 negative - follows with Oncology given breast cancer history Family history-  Colon cancer: no  Breast cancer:  paternal aunt  Ovarian cancer: no Menses: LMP 2 months ago, previously was consistent once monthly, not no recent sexual activity Sexually active: no Vaccines-   Flu: due  Tetanus: UTD  COVID19: x2, declines further boosters HIV screening: UTD Hep C Screening: UTD Tobacco use: no Alcohol use: occasional  Illicit Drug use: no Dentist: yes Ophthalmology: yes   Active Ambulatory Problems    Diagnosis Date Noted   Overweight (BMI 25.0-29.9) 09/03/2015   Routine general medical examination at a health care facility 02/27/2016   Iron deficiency anemia 05/20/2017   Malignant neoplasm of left female breast ([place]) 11/03/2018   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[place])    Need for prophylactic vaccination and inoculation against influenza 09/20/2019   Chronic headaches 11/14/2020   Breast lesion 12/13/2020   Resolved Ambulatory Problems    Diagnosis Date Noted   Encounter to establish care 09/03/2015   Pruritus 09/03/2015   Need for prophylactic vaccination with combined diphtheria-tetanus-pertussis (DTP) vaccine 09/03/2015   Encounter for immunization 09/03/2015   Need for Tdap vaccination 09/03/2015   Adnexal pain 11/18/2012   Ankle swelling, right 05/20/2017   Constipation 05/20/2017   Enteritis 06/30/2017   Malrotation of intestine    Elevated glucose 06/07/2018   Acute gastritis without hemorrhage    Acute duodenitis    Other specified diseases of intestine    Goals of care, counseling/discussion 01/05/2019    Bruising 11/14/2020   Past Medical History:  Diagnosis Date   Amblyopia of eye, right    Anemia    Asthma    Breast cancer ([place]) 2019   Cancer ([place]) 10/27/2018   Difficult intubation 11/03/2016   Endometriosis    GERD (gastroesophageal reflux disease)    Migraine    Personal history of radiation therapy     Family History  Problem Relation Age of Onset   Lupus Mother    Hypertension Mother    Lymphoma Mother 74       B cell; currently 59   Hypertension Father        currently 32   Diabetes Father    Breast cancer Paternal Aunt 38       deceased 39   Lung cancer Paternal Aunt 12       deceased 95   Brain cancer Paternal Grandfather 5       deceased 71    Social History   Socioeconomic History   Marital status: Married    Spouse name: 0   Number of children: 0   Years of education: Not on file   Highest education level: Associate degree: academic program  Occupational History   Occupation: Librarian, academic    Comment: [employer]  Tobacco Use   Smoking status: Former    Packs/day: 1.00    Years: 15.00    Pack years: 15.00    Types: Cigarettes    Quit date: 02/21/2015    Years since quitting: 6.7   Smokeless tobacco: Never  Vaping Use   Vaping Use: Former   Quit date: 01/01/2019  Substance  and Sexual Activity   Alcohol use: Yes    Alcohol/week: 2.0 standard drinks    Types: 2 Cans of beer per week    Comment: Occasional    Drug use: No   Sexual activity: Yes    Partners: Male    Comment: 1 female  Other Topics Concern   Not on file  Social History Narrative   Works- Engineer, 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 CV:  Negative for chest pain, dyspnea, edema, palpitations Resp: Negative for cough, dyspnea, hemoptysis GI: Negative for nausea, vomiting, diarrhea, constipation, abdominal pain, melena, hematochezia. GU: Negative for dysuria, incontinence, urinary hesitance, hematuria, vaginal or penile discharge, polyuria, sexual difficulty, lumps in testicle or breasts MSK: Negative for muscle cramps or aches, joint pain or swelling Neuro: Negative for headaches, weakness, numbness, dizziness, passing out/fainting Psych: Negative for depression, anxiety, memory problems  Objective  Physical Exam Vitals:   11/17/21 0822  BP: 120/80  Pulse: 91  Temp: 98 F (36.7 C)  SpO2: 98%    BP Readings from Last 3 Encounters:  11/17/21 120/80  11/10/21 128/82  11/06/21 114/79   Wt Readings from Last 3 Encounters:  11/17/21 149 lb (67.6 kg)  11/10/21 150 lb (68 kg)  10/13/21 150 lb (68 kg)    Physical Exam Constitutional:      General: She is not in acute distress.    Appearance: She is not diaphoretic.  HENT:     Head: Normocephalic and atraumatic.  Eyes:     Conjunctiva/sclera: Conjunctivae normal.     Pupils: Pupils are equal, round, and reactive to light.  Cardiovascular:     Rate and Rhythm: Normal rate and regular rhythm.     Heart sounds: Normal heart sounds.  Pulmonary:     Effort: Pulmonary effort is normal.     Breath sounds: Normal breath sounds.  Abdominal:     General: Bowel sounds are normal. There is no distension.     Palpations: Abdomen is soft.     Tenderness: There is no abdominal tenderness. There is no guarding or rebound.  Musculoskeletal:     Right lower leg: No edema.     Left lower leg: No edema.   Lymphadenopathy:     Cervical: No cervical adenopathy.  Skin:    General: Skin is warm and dry.  Neurological:     Mental Status: She is alert.  Psychiatric:        Mood and Affect: Mood normal.     Assessment/Plan:   Problem List Items Addressed This Visit     Overweight (BMI 25.0-29.9)   Relevant Orders   HgB A1c   Hemoglobin A1c   Routine general medical examination at a health care facility - Primary    Physical exam completed.  Encouraged continued healthy exercise.  Discussed well-balanced diet.  Pap smear, mammogram, and colonoscopy are up-to-date.  I did discuss completing a urine pregnancy test given her lack of menses though she declined this given lack of sexual activity. She will follow-up with her GYN for breast and pelvic exams and to discuss her lack of a menstrual cycle.  I suspect this is related to perimenopause. She will be given a flu vaccine today.  Lab work as outlined.      Other Visit Diagnoses     Lipid screening       Relevant Orders   Lipid panel   Comp Met (CMET)   Lipid panel   Comp Met (CMET)   Need for immunization against influenza       Relevant Orders   Flu Vaccine QUAD 31moIM (Fluarix, Fluzone & Alfiuria Quad PF) (Completed)       Return for CPE, labs 2 days before hand.  This visit occurred during the SARS-CoV-2 public health emergency.  Safety protocols were in place, including screening questions prior to the visit, additional usage of staff PPE, and extensive cleaning of exam room while observing appropriate contact time as indicated for disinfecting solutions.    ETommi Rumps MD [place]

## 2021-11-25 ENCOUNTER — Other Ambulatory Visit: Payer: Self-pay

## 2021-11-25 ENCOUNTER — Inpatient Hospital Stay: Payer: BC Managed Care – PPO

## 2021-11-25 VITALS — BP 110/76 | HR 83 | Temp 97.0°F | Resp 18

## 2021-11-25 DIAGNOSIS — D509 Iron deficiency anemia, unspecified: Secondary | ICD-10-CM

## 2021-11-25 MED ORDER — IRON SUCROSE 20 MG/ML IV SOLN
200.0000 mg | Freq: Once | INTRAVENOUS | Status: AC
  Administered 2021-11-25: 200 mg via INTRAVENOUS

## 2021-11-25 MED ORDER — SODIUM CHLORIDE 0.9 % IV SOLN: 200.0000 mg | Freq: Once | INTRAVENOUS | Status: DC

## 2021-11-25 MED ORDER — SODIUM CHLORIDE 0.9 % IV SOLN
Freq: Once | INTRAVENOUS | Status: AC
  Filled 2021-11-25: qty 250

## 2022-01-08 ENCOUNTER — Other Ambulatory Visit: Payer: Self-pay | Admitting: *Deleted

## 2022-01-08 DIAGNOSIS — D509 Iron deficiency anemia, unspecified: Secondary | ICD-10-CM

## 2022-01-13 ENCOUNTER — Other Ambulatory Visit: Payer: Self-pay

## 2022-01-13 ENCOUNTER — Inpatient Hospital Stay: Payer: BC Managed Care – PPO | Attending: Radiation Oncology

## 2022-01-13 ENCOUNTER — Other Ambulatory Visit: Payer: BC Managed Care – PPO

## 2022-01-13 DIAGNOSIS — D509 Iron deficiency anemia, unspecified: Secondary | ICD-10-CM | POA: Diagnosis not present

## 2022-01-13 LAB — IRON AND TIBC
Iron: 112 ug/dL (ref 28–170)
Saturation Ratios: 34 % — ABNORMAL HIGH (ref 10.4–31.8)
TIBC: 329 ug/dL (ref 250–450)
UIBC: 217 ug/dL

## 2022-01-13 LAB — CBC WITH DIFFERENTIAL/PLATELET
Abs Immature Granulocytes: 0.01 10*3/uL (ref 0.00–0.07)
Basophils Absolute: 0 10*3/uL (ref 0.0–0.1)
Basophils Relative: 1 %
Eosinophils Absolute: 0.2 10*3/uL (ref 0.0–0.5)
Eosinophils Relative: 3 %
HCT: 39 % (ref 36.0–46.0)
Hemoglobin: 13.4 g/dL (ref 12.0–15.0)
Immature Granulocytes: 0 %
Lymphocytes Relative: 25 %
Lymphs Abs: 1.6 10*3/uL (ref 0.7–4.0)
MCH: 31.6 pg (ref 26.0–34.0)
MCHC: 34.4 g/dL (ref 30.0–36.0)
MCV: 92 fL (ref 80.0–100.0)
Monocytes Absolute: 0.5 10*3/uL (ref 0.1–1.0)
Monocytes Relative: 8 %
Neutro Abs: 4.1 10*3/uL (ref 1.7–7.7)
Neutrophils Relative %: 63 %
Platelets: 382 10*3/uL (ref 150–400)
RBC: 4.24 MIL/uL (ref 3.87–5.11)
RDW: 13.7 % (ref 11.5–15.5)
WBC: 6.5 10*3/uL (ref 4.0–10.5)
nRBC: 0 % (ref 0.0–0.2)

## 2022-01-13 LAB — FERRITIN: Ferritin: 179 ng/mL (ref 11–307)

## 2022-01-20 ENCOUNTER — Inpatient Hospital Stay (HOSPITAL_BASED_OUTPATIENT_CLINIC_OR_DEPARTMENT_OTHER): Payer: BC Managed Care – PPO | Admitting: Oncology

## 2022-01-20 ENCOUNTER — Encounter: Payer: Self-pay | Admitting: Oncology

## 2022-01-20 DIAGNOSIS — D509 Iron deficiency anemia, unspecified: Secondary | ICD-10-CM | POA: Diagnosis not present

## 2022-01-20 DIAGNOSIS — Z08 Encounter for follow-up examination after completed treatment for malignant neoplasm: Secondary | ICD-10-CM | POA: Diagnosis not present

## 2022-01-20 DIAGNOSIS — Z853 Personal history of malignant neoplasm of breast: Secondary | ICD-10-CM

## 2022-01-20 NOTE — Progress Notes (Signed)
Patient has no new questions or concerns today

## 2022-01-20 NOTE — Progress Notes (Signed)
I connected with Melinda Holder on 01/20/22 at  2:45 PM EST by video enabled telemedicine visit and verified that I am speaking with the correct person using two identifiers.   I discussed the limitations, risks, security and privacy concerns of performing an evaluation and management service by telemedicine and the availability of in-person appointments. I also discussed with the patient that there may be a patient responsible charge related to this service. The patient expressed understanding and agreed to proceed.  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  She has received Feraheme in the past last in March 2019 and her hemoglobin had improved.  Patient also has thrombocytosis dating back to 2017.  Jak 2 mutation testing has been negative.  Bone marrow biopsy not done at this time as she is less than 50 years of age with no prior history of thrombosis.   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  OS plus AI was recommended without adjuvant chemotherapy.  Patient could not tolerate Lupron and was therefore switched to tamoxifen.  Patient would also not tolerate tamoxifen due to worsening headaches and stopped taking it.  Currently patient is not taking any antihormonal therapy.   For iron deficiency anemia she has been getting periodic Feraheme    Interval history patient reports improvement in her energy levels after she received IV iron in November 2022.  Reports that her menstrual cycles are now intermittent and  there are couple of months when she does not get them at all.  Menstrual cycles presently are not heavy.  Denies any breast concerns   Review of Systems  Constitutional:  Negative for chills, fever, malaise/fatigue and weight loss.  HENT:  Negative for congestion, ear discharge and nosebleeds.   Eyes:  Negative for blurred vision.  Respiratory:  Negative for cough, hemoptysis, sputum production, shortness of breath and wheezing.   Cardiovascular:  Negative for chest pain, palpitations, orthopnea and claudication.  Gastrointestinal:  Negative for abdominal pain, blood in stool, constipation, diarrhea, heartburn, melena, nausea and vomiting.  Genitourinary:  Negative for dysuria, flank pain, frequency, hematuria and urgency.  Musculoskeletal:  Negative for back pain, joint pain and myalgias.  Skin:  Negative for rash.  Neurological:  Negative for dizziness, tingling, focal weakness, seizures, weakness and headaches.  Endo/Heme/Allergies:  Does not bruise/bleed easily.  Psychiatric/Behavioral:  Negative for depression and suicidal ideas. The patient does not have insomnia.    No Known Allergies  Past Medical History:  Diagnosis Date   Amblyopia of eye, right    Anemia    Asthma    childhood   Breast cancer ([place]) 2019   Cancer ([place]) 10/27/2018   left breast   Difficult intubation 11/03/2016   DL x 2 with MAC 3 (CRNA and MD), very anterior. DL x 2 with Glidescope #3 Lo pro, bougie utilized. Easy mask.   Endometriosis    GERD (gastroesophageal reflux disease)    Migraine    Personal history of radiation therapy     Past Surgical History:  Procedure Laterality Date   ABLATION ON  ENDOMETRIOSIS     BREAST BIOPSY Left 10/28/2018   INVASIVE MAMMARY CARCINOMA   BREAST EXCISIONAL BIOPSY Left 10/28/2018   BREAST LUMPECTOMY Left 11/17/2018   INVASIVE MAMMARY CARCINOMA   BREAST LUMPECTOMY Left 12/19/2018   re-excision Procedure: BREAST LUMPECTOMY;  Surgeon: Jules Husbands, MD;  Location:  [facility];  Service: General;  Laterality: Left;   BREAST LUMPECTOMY WITH NEEDLE LOCALIZATION Left 11/17/2018   Procedure: BREAST LUMPECTOMY WITH NEEDLE LOCALIZATION;  Surgeon: Jules Husbands, MD;  Location: [facility];  Service: General;  Laterality: Left;   CLAVICLE SURGERY Left 10/2016   COLONOSCOPY WITH PROPOFOL N/A 06/27/2018   Procedure: COLONOSCOPY WITH PROPOFOL;  Surgeon: Lucilla Lame, MD;  Location: [place];  Service: Endoscopy;  Laterality: N/A;   ESOPHAGOGASTRODUODENOSCOPY (EGD) WITH PROPOFOL N/A 06/27/2018   Procedure: ESOPHAGOGASTRODUODENOSCOPY (EGD) WITH PROPOFOL;  Surgeon: Lucilla Lame, MD;  Location: [place];  Service: Endoscopy;  Laterality: N/A;   EYE MUSCLE SURGERY Right    child   ORIF CLAVICULAR FRACTURE Left 11/03/2016   Procedure: OPEN REDUCTION INTERNAL FIXATION (ORIF) CLAVICULAR FRACTURE;  Surgeon: Corky Mull, MD;  Location: [facility];  Service: Orthopedics;  Laterality: Left;   removal of fibroid     SENTINEL NODE BIOPSY Left 11/17/2018   Procedure: SENTINEL NODE BIOPSY;  Surgeon: Jules Husbands, MD;  Location: [facility];  Service: General;  Laterality: Left;    Social History   Socioeconomic History   Marital status: Married    Spouse name: 0   Number of children: 0   Years of education: Not on file   Highest education level: Associate degree: academic program  Occupational History   Occupation: Librarian, academic    Comment: [employer]  Tobacco Use   Smoking status: Former    Packs/day: 1.00    Years: 15.00    Pack years: 15.00    Types: Cigarettes    Quit date: 02/21/2015    Years since quitting: 6.9   Smokeless tobacco: Never  Vaping Use   Vaping Use: Former   Quit date: 01/01/2019  Substance and Sexual Activity   Alcohol use: Yes    Alcohol/week: 2.0 standard drinks    Types: 2 Cans of beer per week    Comment: Occasional    Drug use: No   Sexual activity: Yes    Partners: Male    Comment: 1 female  Other Topics Concern   Not  on file  Social History Narrative   Works- Engineer, 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    Children- 4 step children    Pets: None    Caffeine- Tea 3 cups and Coffee 2 cups    Social Determinants of Health   Financial Resource Strain: Not on file  Food Insecurity: Not on file  Transportation Needs: Not on file  Physical Activity: Not on file  Stress: Not on file  Social Connections: Not on file  Intimate Partner Violence: Not on file    Family History  Problem Relation Age of Onset   Lupus Mother    Hypertension Mother    Lymphoma Mother 73       B cell; currently 32   Hypertension Father        currently 13   Diabetes Father    Breast cancer Paternal Aunt 65       deceased 35   Lung cancer Paternal Aunt 81       deceased 32   Brain cancer Paternal Grandfather  68       deceased 53     Current Outpatient Medications:    hydrOXYzine (ATARAX/VISTARIL) 10 MG tablet, TAKE 1 TABLET BY MOUTH EVERYDAY AT BEDTIME, Disp: 30 tablet, Rfl: 8   ipratropium (ATROVENT) 0.06 % nasal spray, Place 2 sprays into both nostrils 4 (four) times daily., Disp: 15 mL, Rfl: 12   promethazine-dextromethorphan (PROMETHAZINE-DM) 6.25-15 MG/5ML syrup, Take 5 mLs by mouth 4 (four) times daily as needed., Disp: 118 mL, Rfl: 0  No results found.  No images are attached to the encounter.   CMP Latest Ref Rng & Units 11/17/2021  Glucose 70 - 99 mg/dL 95  BUN 6 - 23 mg/dL 15  Creatinine 0.40 - 1.20 mg/dL 0.63  Sodium 135 - 145 mEq/L 137  Potassium 3.5 - 5.1 mEq/L 4.2  Chloride 96 - 112 mEq/L 103  CO2 19 - 32 mEq/L 26  Calcium 8.4 - 10.5 mg/dL 9.4  Total Protein 6.0 - 8.3 g/dL 7.3  Total Bilirubin 0.2 - 1.2 mg/dL 0.3  Alkaline Phos 39 - 117 U/L 41  AST 0 - 37 U/L 20  ALT 0 - 35 U/L 16   CBC Latest Ref Rng & Units 01/13/2022  WBC 4.0 - 10.5 K/uL 6.5  Hemoglobin 12.0 - 15.0 g/dL 13.4  Hematocrit 36.0 - 46.0 % 39.0  Platelets 150 - 400 K/uL 382     Observation/objective: Appears in  no acute distress over video visit today.  Breathing is nonlabored  Assessment and plan: Patient is a 51 year old female who is here for following issues:  Iron deficiency anemia: Patient received 5 doses of Venofer in November 2022.  Hemoglobin improved to 13.4 and iron studies did not show any evidence of iron deficiency.  She does not require any IV iron at this time.  If her iron deficiency recurs I will refer her back to GI since her menstrual cycles are not particularly heavy.  She did undergo EGD and colonoscopy by Dr. Allen Norris in 2019.  CBC ferritin and iron studies in April 2023  Stage I left breast cancer: I will see her in April 2023 for in person breast exam.  She did have a mammogram in October 2022 which was unremarkable  Follow-up instructions: As above  I discussed the assessment and treatment plan with the patient. The patient was provided an opportunity to ask questions and all were answered. The patient agreed with the plan and demonstrated an understanding of the instructions.   The patient was advised to call back or seek an in-person evaluation if the symptoms worsen or if the condition fails to improve as anticipated.    Visit Diagnosis: 1. Iron deficiency anemia, unspecified iron deficiency anemia type   2. Encounter for follow-up surveillance of breast cancer     Dr. Randa Evens, MD, MPH [facility] at [facility] Tel- [phone] 01/20/2022 3:59 PM

## 2022-01-20 NOTE — Addendum Note (Signed)
Addended by: Luella Cook on: 01/20/2022 09:42 PM   Modules accepted: Orders

## 2022-02-02 ENCOUNTER — Other Ambulatory Visit: Payer: Self-pay

## 2022-02-02 ENCOUNTER — Ambulatory Visit
Admission: EM | Admit: 2022-02-02 | Discharge: 2022-02-02 | Disposition: A | Discharge status: Home / Self Care | Payer: BC Managed Care – PPO | Attending: Emergency Medicine | Admitting: Emergency Medicine

## 2022-02-02 DIAGNOSIS — R059 Cough, unspecified: Secondary | ICD-10-CM | POA: Diagnosis not present

## 2022-02-02 DIAGNOSIS — Z20822 Contact with and (suspected) exposure to covid-19: Secondary | ICD-10-CM | POA: Diagnosis not present

## 2022-02-02 DIAGNOSIS — R197 Diarrhea, unspecified: Secondary | ICD-10-CM | POA: Insufficient documentation

## 2022-02-02 DIAGNOSIS — Z87891 Personal history of nicotine dependence: Secondary | ICD-10-CM | POA: Insufficient documentation

## 2022-02-02 DIAGNOSIS — J069 Acute upper respiratory infection, unspecified: Secondary | ICD-10-CM | POA: Diagnosis not present

## 2022-02-02 MED ORDER — PROMETHAZINE-DM 6.25-15 MG/5ML PO SYRP: 5.0000 mL | ORAL_SOLUTION | Freq: Four times a day (QID) | ORAL | 0 refills | Status: DC | PRN

## 2022-02-02 MED ORDER — IPRATROPIUM BROMIDE 0.06 % NA SOLN: 2.0000 | Freq: Four times a day (QID) | NASAL | 12 refills | Status: DC

## 2022-02-02 MED ORDER — BENZONATATE 100 MG PO CAPS: 200.0000 mg | ORAL_CAPSULE | Freq: Three times a day (TID) | ORAL | 0 refills | Status: DC

## 2022-02-02 NOTE — ED Triage Notes (Signed)
Patient presents to Urgent Care with complaints of cough and nasal congestion x 4 days. Diarrhea since yesterday. Not taking any medications.   Denies fever.

## 2022-02-02 NOTE — ED Provider Notes (Signed)
[facility]    CSN: [id number] Arrival date & time: 02/02/22  0834      History   Chief Complaint Chief Complaint  Patient presents with   Cough   Diarrhea   Nasal Congestion    HPI Melinda Holder is a 51 y.o. female.   HPI  51 year old female here for evaluation of respiratory complaints.  Patient ports that she has had nasal congestion and dry cough for the past 4 days and then developed diarrhea yesterday.  She also had sweats and chills overnight and developed headache, body aches, nausea, and vomiting today.  She also endorses some mild ear pressure.  No documented fever, nasal discharge, sore throat, shortness breath, or wheezing.  Past Medical History:  Diagnosis Date   Amblyopia of eye, right    Anemia    Asthma    childhood   Breast cancer ([place]) 2019   Cancer ([place]) 10/27/2018   left breast   Difficult intubation 11/03/2016   DL x 2 with MAC 3 (CRNA and MD), very anterior. DL x 2 with Glidescope #3 Lo pro, bougie utilized. Easy mask.   Endometriosis    GERD (gastroesophageal reflux disease)    Migraine    Personal history of radiation therapy     Patient Active Problem List   Diagnosis Date Noted   Breast lesion 12/13/2020   Chronic headaches 11/14/2020   Need for prophylactic vaccination and inoculation against influenza 09/20/2019   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[place])    Malignant neoplasm of left female breast ([place]) 11/03/2018   Iron deficiency anemia 05/20/2017   Routine general medical examination at a health care facility 02/27/2016   Overweight (BMI 25.0-29.9) 09/03/2015    Past Surgical History:  Procedure Laterality Date   ABLATION ON ENDOMETRIOSIS     BREAST BIOPSY Left 10/28/2018   INVASIVE MAMMARY CARCINOMA   BREAST EXCISIONAL BIOPSY Left 10/28/2018   BREAST LUMPECTOMY Left 11/17/2018   INVASIVE MAMMARY CARCINOMA   BREAST LUMPECTOMY Left 12/19/2018   re-excision Procedure: BREAST LUMPECTOMY;   Surgeon: Jules Husbands, MD;  Location: [facility];  Service: General;  Laterality: Left;   BREAST LUMPECTOMY WITH NEEDLE LOCALIZATION Left 11/17/2018   Procedure: BREAST LUMPECTOMY WITH NEEDLE LOCALIZATION;  Surgeon: Jules Husbands, MD;  Location: [facility];  Service: General;  Laterality: Left;   CLAVICLE SURGERY Left 10/2016   COLONOSCOPY WITH PROPOFOL N/A 06/27/2018   Procedure: COLONOSCOPY WITH PROPOFOL;  Surgeon: Lucilla Lame, MD;  Location: [place];  Service: Endoscopy;  Laterality: N/A;   ESOPHAGOGASTRODUODENOSCOPY (EGD) WITH PROPOFOL N/A 06/27/2018   Procedure: ESOPHAGOGASTRODUODENOSCOPY (EGD) WITH PROPOFOL;  Surgeon: Lucilla Lame, MD;  Location: [place];  Service: Endoscopy;  Laterality: N/A;   EYE MUSCLE SURGERY Right    child   ORIF CLAVICULAR FRACTURE Left 11/03/2016   Procedure: OPEN REDUCTION INTERNAL FIXATION (ORIF) CLAVICULAR FRACTURE;  Surgeon: Corky Mull, MD;  Location: [facility];  Service: Orthopedics;  Laterality: Left;   removal of fibroid     SENTINEL NODE BIOPSY Left 11/17/2018   Procedure: SENTINEL NODE BIOPSY;  Surgeon: Jules Husbands, MD;  Location: [facility];  Service: General;  Laterality: Left;    OB History     Gravida  2   Para  0   Term  0   Preterm  0   AB  2   Living  0      SAB  0   IAB  0  Ectopic  0   Multiple  0   Live Births  0        Obstetric Comments  1st Menstrual Cycle:  14             Home Medications    Prior to Admission medications   Medication Sig Start Date End Date Taking? Authorizing Provider  benzonatate (TESSALON) 100 MG capsule Take 2 capsules (200 mg total) by mouth every 8 (eight) hours. 02/02/22  Yes Margarette Canada, NP  ipratropium (ATROVENT) 0.06 % nasal spray Place 2 sprays into both nostrils 4 (four) times daily. 02/02/22  Yes Margarette Canada, NP  promethazine-dextromethorphan (PROMETHAZINE-DM) 6.25-15 MG/5ML syrup Take 5 mLs by mouth 4 (four) times daily as needed. 02/02/22  Yes Margarette Canada, NP  hydrOXYzine (ATARAX/VISTARIL) 10 MG tablet TAKE 1 TABLET BY MOUTH EVERYDAY AT BEDTIME 08/13/21   Leone Haven, MD  fluticasone [facility]) 50 MCG/ACT nasal spray Place 2 sprays into both nostrils daily. 08/26/20 02/18/21  Melynda Ripple, MD    Family History Family History  Problem Relation Age of Onset   Lupus Mother    Hypertension Mother    Lymphoma Mother 30       B cell; currently 7   Hypertension Father        currently 59   Diabetes Father    Breast cancer Paternal Aunt 53       deceased 21   Lung cancer Paternal Aunt 82       deceased 61   Brain cancer Paternal Grandfather 65       deceased 30    Social History Social History   Tobacco Use   Smoking status: Former    Packs/day: 1.00    Years: 15.00    Pack years: 15.00    Types: Cigarettes    Quit date: 02/21/2015    Years since quitting: 6.9   Smokeless tobacco: Never  Vaping Use   Vaping Use: Former   Quit date: 01/01/2019  Substance Use Topics   Alcohol use: Yes    Alcohol/week: 2.0 standard drinks    Types: 2 Cans of beer per week    Comment: Occasional    Drug use: No     Allergies   Patient has no known allergies.   Review of Systems Review of Systems  Constitutional:  Positive for chills and diaphoresis. Negative for fever.  HENT:  Positive for congestion and ear pain. Negative for ear discharge, rhinorrhea and sore throat.   Respiratory:  Positive for cough. Negative for shortness of breath and wheezing.   Gastrointestinal:  Positive for diarrhea, nausea and vomiting.  Musculoskeletal:  Positive for arthralgias and myalgias.  Skin:  Negative for rash.  Neurological:  Positive for headaches.  Hematological: Negative.   Psychiatric/Behavioral: Negative.      Physical Exam Triage Vital Signs ED Triage Vitals  Enc Vitals Group     BP 02/02/22 0844 113/72     Pulse Rate 02/02/22 0844 74     Resp 02/02/22 0844 16     Temp 02/02/22 0844 98 F (36.7 C)     Temp Source  02/02/22 0844 Oral     SpO2 02/02/22 0844 97 %     Weight --      Height --      Head Circumference --      Peak Flow --      Pain Score 02/02/22 0845 0     Pain Loc --  Pain Edu? --      Excl. in [place]? --    No data found.  Updated Vital Signs BP 113/72 (BP Location: Right Arm)    Pulse 74    Temp 98 F (36.7 C) (Oral)    Resp 16    SpO2 97%   Visual Acuity Right Eye Distance:   Left Eye Distance:   Bilateral Distance:    Right Eye Near:   Left Eye Near:    Bilateral Near:     Physical Exam Vitals and nursing note reviewed.  Constitutional:      General: She is not in acute distress.    Appearance: Normal appearance. She is ill-appearing.  HENT:     Head: Normocephalic and atraumatic.     Right Ear: Tympanic membrane, ear canal and external ear normal. There is no impacted cerumen.     Left Ear: Tympanic membrane, ear canal and external ear normal. There is no impacted cerumen.     Nose: Congestion and rhinorrhea present.     Mouth/Throat:     Mouth: Mucous membranes are moist.     Pharynx: Oropharynx is clear. No posterior oropharyngeal erythema.  Cardiovascular:     Rate and Rhythm: Normal rate and regular rhythm.     Pulses: Normal pulses.     Heart sounds: Normal heart sounds. No murmur heard.   No friction rub. No gallop.  Pulmonary:     Effort: Pulmonary effort is normal.     Breath sounds: Normal breath sounds. No wheezing, rhonchi or rales.  Musculoskeletal:     Cervical back: Normal range of motion and neck supple.  Lymphadenopathy:     Cervical: No cervical adenopathy.  Skin:    General: Skin is warm and dry.     Capillary Refill: Capillary refill takes less than 2 seconds.     Findings: No erythema or rash.  Neurological:     General: No focal deficit present.     Mental Status: She is alert and oriented to person, place, and time.  Psychiatric:        Mood and Affect: Mood normal.        Behavior: Behavior normal.        Thought Content:  Thought content normal.        Judgment: Judgment normal.     UC Treatments / Results  Labs (all labs ordered are listed, but only abnormal results are displayed) Labs Reviewed  SARS CORONAVIRUS 2 (TAT 6-24 HRS)    EKG   Radiology No results found.  Procedures Procedures (including critical care time)  Medications Ordered in UC Medications - No data to display  Initial Impression / Assessment and Plan / UC Course  I have reviewed the triage vital signs and the nursing notes.  Pertinent labs & imaging results that were available during my care of the patient were reviewed by me and considered in my medical decision making (see chart for details).  Patient is a very pleasant though ill-appearing 51 year old female here for evaluation of respiratory and GI complaints.  Patient endorses nasal congestion and a nonproductive cough that is mostly present at night that have been present for the past 4 days.  Yesterday she developed diarrhea and some mild ear pressure.  Overnight she had chills, sweats, some vomiting, and then awoke this morning with a headache and body aches.  She states that several of her coworkers have been ill and one of them was diagnosed with COVID.  She  has not had a documented fever, nasal discharge, sore throat, shortness breath, or wheezing.  Her physical exam reveals pearly-gray tympanic membranes bilaterally with normal light reflex and clear external auditory canals.  Nasal mucosa is erythematous and edematous with scant clear discharge in both nares.  Oropharyngeal exam is benign.  No cervical of adenopathy appreciated exam.  Cardiopulmonary exam feels clung sounds in all fields.  Given that patient had significant worsening of her symptoms yesterday I will swab her for COVID despite she has had 4 days of other respiratory symptoms.  If she test positive we will use the worsening day, yesterday, as the onset for her quarantine duration.  I have given her a work  note to cover her for the isolation interval.  I will prescribe her Atrovent nasal spray to help with nasal congestion, Tessalon Perles for use for her cough during the day if needed and Promethazine DM for use at bedtime.  She is to use Tylenol and ibuprofen as needed for fever and body aches.  If patient test positive I will place her on molnupiravir twice daily for 5 days and extend her work note for the 5-day quarantine duration.   Final Clinical Impressions(s) / UC Diagnoses   Final diagnoses:  Viral URI with cough     Discharge Instructions      Isolate at home pending the results of your COVID test.  If you test positive then you will have to quarantine for 5 days from the start of your symptoms.  After 5 days you can break quarantine if your symptoms have improved and you have not had a fever for 24 hours without taking Tylenol or ibuprofen.  Use over-the-counter Tylenol and ibuprofen as needed for body aches and fever.  Use the Atrovent nasal spray, 2 squirts in each nostril every 6 hours, as needed for runny nose and postnasal drip.  Use the Tessalon Perles every 8 hours during the day.  Take them with a small sip of water.  They may give you some numbness to the base of your tongue or a metallic taste in your mouth, this is normal.  Use the Promethazine DM cough syrup at bedtime for cough and congestion.  It will make you drowsy so do not take it during the day.  If you test positive for COVID we will treat you with molnupiravir.  If you develop any increased shortness of breath-especially at rest, you are unable to speak in full sentences, or is a late sign your lips are turning blue you need to go the ER for evaluation.      ED Prescriptions     Medication Sig Dispense Auth. Provider   benzonatate (TESSALON) 100 MG capsule Take 2 capsules (200 mg total) by mouth every 8 (eight) hours. 21 capsule Margarette Canada, NP   ipratropium (ATROVENT) 0.06 % nasal spray Place 2 sprays  into both nostrils 4 (four) times daily. 15 mL Margarette Canada, NP   promethazine-dextromethorphan (PROMETHAZINE-DM) 6.25-15 MG/5ML syrup Take 5 mLs by mouth 4 (four) times daily as needed. 118 mL Margarette Canada, NP      PDMP not reviewed this encounter.   Margarette Canada, NP 02/02/22 [phone]

## 2022-02-02 NOTE — Discharge Instructions (Signed)
Isolate at home pending the results of your COVID test.  If you test positive then you will have to quarantine for 5 days from the start of your symptoms.  After 5 days you can break quarantine if your symptoms have improved and you have not had a fever for 24 hours without taking Tylenol or ibuprofen.  Use over-the-counter Tylenol and ibuprofen as needed for body aches and fever.  Use the Atrovent nasal spray, 2 squirts in each nostril every 6 hours, as needed for runny nose and postnasal drip.  Use the Tessalon Perles every 8 hours during the day.  Take them with a small sip of water.  They may give you some numbness to the base of your tongue or a metallic taste in your mouth, this is normal.  Use the Promethazine DM cough syrup at bedtime for cough and congestion.  It will make you drowsy so do not take it during the day.  If you test positive for COVID we will treat you with molnupiravir.  If you develop any increased shortness of breath-especially at rest, you are unable to speak in full sentences, or is a late sign your lips are turning blue you need to go the ER for evaluation.

## 2022-02-03 LAB — SARS CORONAVIRUS 2 (TAT 6-24 HRS): SARS Coronavirus 2: NEGATIVE

## 2022-03-25 ENCOUNTER — Other Ambulatory Visit: Payer: BC Managed Care – PPO

## 2022-03-25 ENCOUNTER — Ambulatory Visit: Payer: BC Managed Care – PPO | Admitting: Radiation Oncology

## 2022-03-26 ENCOUNTER — Other Ambulatory Visit: Payer: BC Managed Care – PPO

## 2022-03-27 ENCOUNTER — Telehealth: Payer: BC Managed Care – PPO | Admitting: Oncology

## 2022-04-17 ENCOUNTER — Inpatient Hospital Stay (HOSPITAL_BASED_OUTPATIENT_CLINIC_OR_DEPARTMENT_OTHER): Payer: BC Managed Care – PPO | Admitting: Oncology

## 2022-04-17 ENCOUNTER — Encounter: Payer: Self-pay | Admitting: Oncology

## 2022-04-17 ENCOUNTER — Inpatient Hospital Stay: Payer: BC Managed Care – PPO | Attending: Oncology

## 2022-04-17 VITALS — BP 131/87 | HR 76 | Temp 97.3°F | Resp 16 | Wt 155.5 lb

## 2022-04-17 DIAGNOSIS — Z801 Family history of malignant neoplasm of trachea, bronchus and lung: Secondary | ICD-10-CM | POA: Diagnosis not present

## 2022-04-17 DIAGNOSIS — Z8249 Family history of ischemic heart disease and other diseases of the circulatory system: Secondary | ICD-10-CM | POA: Diagnosis not present

## 2022-04-17 DIAGNOSIS — Z853 Personal history of malignant neoplasm of breast: Secondary | ICD-10-CM | POA: Insufficient documentation

## 2022-04-17 DIAGNOSIS — Z808 Family history of malignant neoplasm of other organs or systems: Secondary | ICD-10-CM | POA: Diagnosis not present

## 2022-04-17 DIAGNOSIS — D509 Iron deficiency anemia, unspecified: Secondary | ICD-10-CM | POA: Diagnosis not present

## 2022-04-17 DIAGNOSIS — Z803 Family history of malignant neoplasm of breast: Secondary | ICD-10-CM | POA: Insufficient documentation

## 2022-04-17 DIAGNOSIS — Z79899 Other long term (current) drug therapy: Secondary | ICD-10-CM | POA: Diagnosis not present

## 2022-04-17 DIAGNOSIS — Z923 Personal history of irradiation: Secondary | ICD-10-CM | POA: Diagnosis not present

## 2022-04-17 DIAGNOSIS — Z87891 Personal history of nicotine dependence: Secondary | ICD-10-CM | POA: Diagnosis not present

## 2022-04-17 DIAGNOSIS — Z833 Family history of diabetes mellitus: Secondary | ICD-10-CM | POA: Diagnosis not present

## 2022-04-17 DIAGNOSIS — Z832 Family history of diseases of the blood and blood-forming organs and certain disorders involving the immune mechanism: Secondary | ICD-10-CM | POA: Diagnosis not present

## 2022-04-17 DIAGNOSIS — Z08 Encounter for follow-up examination after completed treatment for malignant neoplasm: Secondary | ICD-10-CM

## 2022-04-17 LAB — CBC WITH DIFFERENTIAL/PLATELET
Abs Immature Granulocytes: 0.02 10*3/uL (ref 0.00–0.07)
Basophils Absolute: 0 10*3/uL (ref 0.0–0.1)
Basophils Relative: 0 %
Eosinophils Absolute: 0.4 10*3/uL (ref 0.0–0.5)
Eosinophils Relative: 5 %
HCT: 41.2 % (ref 36.0–46.0)
Hemoglobin: 13.9 g/dL (ref 12.0–15.0)
Immature Granulocytes: 0 %
Lymphocytes Relative: 25 %
Lymphs Abs: 2 10*3/uL (ref 0.7–4.0)
MCH: 31.5 pg (ref 26.0–34.0)
MCHC: 33.7 g/dL (ref 30.0–36.0)
MCV: 93.4 fL (ref 80.0–100.0)
Monocytes Absolute: 0.5 10*3/uL (ref 0.1–1.0)
Monocytes Relative: 6 %
Neutro Abs: 5 10*3/uL (ref 1.7–7.7)
Neutrophils Relative %: 64 %
Platelets: 398 10*3/uL (ref 150–400)
RBC: 4.41 MIL/uL (ref 3.87–5.11)
RDW: 12.3 % (ref 11.5–15.5)
WBC: 7.9 10*3/uL (ref 4.0–10.5)
nRBC: 0 % (ref 0.0–0.2)

## 2022-04-17 LAB — IRON AND TIBC
Iron: 95 ug/dL (ref 28–170)
Saturation Ratios: 26 % (ref 10.4–31.8)
TIBC: 370 ug/dL (ref 250–450)
UIBC: 275 ug/dL

## 2022-04-17 LAB — FERRITIN: Ferritin: 68 ng/mL (ref 11–307)

## 2022-04-17 NOTE — Progress Notes (Signed)
? ? ? ?Hematology/Oncology Consult note ?[place]  ?[phone] [phone] ? ?Patient Care Team: ?Leone Haven, MD as PCP - General (Family Medicine) ?Noreene Filbert, MD as Radiation Oncologist (Radiation Oncology)  ? ?Name of the patient: Melinda Holder  ?[id number]  ?[birth year]  ? ?Date of visit: 04/17/22 ? ?Diagnosis-history of breast cancer and iron deficiency anemia ? ?Chief complaint/ Reason for visit-routine follow-up of breast cancer and iron deficiency anemia ? ?Heme/Onc history: Patient is a 51 year old premenopausal woman who sees me for her iron deficiency anemia.  She has received Feraheme in the past last in March 2019 and her hemoglobin had improved.  Patient also has thrombocytosis dating back to 2017.  Jak 2 mutation testing has been negative.  Bone marrow biopsy not done at this time as she is less than 9 years of age with no prior history of thrombosis. ?  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  OS plus AI was recommended without adjuvant chemotherapy.  Patient could not tolerate Lupron and was therefore switched to tamoxifen.  Patient would also not tolerate tamoxifen due to worsening headaches and stopped taking it.  Currently patient is not taking any antihormonal therapy. ?  ?For iron deficiency anemia she has been getting periodic Feraheme ?  ? ?Interval history-patient reports doing well overall.  Denies any specific complaints at this time.  She has not had a regular menstrual cycle since December 2022 and mainly spotting here and there. ? ?ECOG PS- 0 ?Pain scale- 0 ? ? ?Review of systems- Review of Systems  ?Constitutional:  Negative for chills, fever, malaise/fatigue and weight loss.  ?HENT:  Negative for congestion, ear discharge and  nosebleeds.   ?Eyes:  Negative for blurred vision.  ?Respiratory:  Negative for cough, hemoptysis, sputum production, shortness of breath and wheezing.   ?Cardiovascular:  Negative for chest pain, palpitations, orthopnea and claudication.  ?Gastrointestinal:  Negative for abdominal pain, blood in stool, constipation, diarrhea, heartburn, melena, nausea and vomiting.  ?Genitourinary:  Negative for dysuria, flank pain, frequency, hematuria and urgency.  ?Musculoskeletal:  Negative for back pain, joint pain and myalgias.  ?Skin:  Negative for rash.  ?Neurological:  Negative for dizziness, tingling, focal weakness, seizures, weakness and headaches.  ?Endo/Heme/Allergies:  Does not bruise/bleed easily.  ?Psychiatric/Behavioral:  Negative for depression and suicidal ideas. The patient does not have insomnia.    ? ? ? ?No Known Allergies ? ? ?Past Medical History:  ?Diagnosis Date  ? Amblyopia of eye, right   ? Anemia   ? Asthma   ? childhood  ? Breast cancer ([place]) 2019  ? Cancer ([place]) 10/27/2018  ? left breast  ? Difficult intubation 11/03/2016  ? DL x 2 with MAC 3 (CRNA and MD), very anterior. DL x 2 with Glidescope #3 Lo pro, bougie utilized. Easy mask.  ? Endometriosis   ? GERD (gastroesophageal reflux disease)   ? Migraine   ? Personal history of radiation therapy   ? ? ? ?Past Surgical History:  ?Procedure Laterality Date  ? ABLATION ON ENDOMETRIOSIS    ? BREAST BIOPSY Left 10/28/2018  ? INVASIVE MAMMARY CARCINOMA  ? BREAST EXCISIONAL BIOPSY Left 10/28/2018  ? BREAST LUMPECTOMY Left 11/17/2018  ? INVASIVE MAMMARY CARCINOMA  ? BREAST LUMPECTOMY Left 12/19/2018  ? re-excision Procedure: BREAST LUMPECTOMY;  Surgeon: Dahlia Byes,  Marjory Lies, MD;  Location: [facility];  Service: General;  Laterality: Left;  ? BREAST LUMPECTOMY WITH NEEDLE LOCALIZATION Left 11/17/2018  ? Procedure: BREAST LUMPECTOMY WITH NEEDLE LOCALIZATION;  Surgeon: Jules Husbands, MD;  Location: [facility];  Service: General;  Laterality: Left;  ? CLAVICLE SURGERY  Left 10/2016  ? COLONOSCOPY WITH PROPOFOL N/A 06/27/2018  ? Procedure: COLONOSCOPY WITH PROPOFOL;  Surgeon: Lucilla Lame, MD;  Location: [place];  Service: Endoscopy;  Laterality: N/A;  ? ESOPHAGOGASTRODUODENOSCOPY (EGD) WITH PROPOFOL N/A 06/27/2018  ? Procedure: ESOPHAGOGASTRODUODENOSCOPY (EGD) WITH PROPOFOL;  Surgeon: Lucilla Lame, MD;  Location: [place];  Service: Endoscopy;  Laterality: N/A;  ? EYE MUSCLE SURGERY Right   ? child  ? ORIF CLAVICULAR FRACTURE Left 11/03/2016  ? Procedure: OPEN REDUCTION INTERNAL FIXATION (ORIF) CLAVICULAR FRACTURE;  Surgeon: Corky Mull, MD;  Location: [facility];  Service: Orthopedics;  Laterality: Left;  ? removal of fibroid    ? SENTINEL NODE BIOPSY Left 11/17/2018  ? Procedure: SENTINEL NODE BIOPSY;  Surgeon: Jules Husbands, MD;  Location: [facility];  Service: General;  Laterality: Left;  ? ? ?Social History  ? ?Socioeconomic History  ? Marital status: Married  ?  Spouse name: 0  ? Number of children: 0  ? Years of education: Not on file  ? Highest education level: Associate degree: academic program  ?Occupational History  ? Occupation: Librarian, academic  ?  Comment: [employer]  ?Tobacco Use  ? Smoking status: Former  ?  Packs/day: 1.00  ?  Years: 15.00  ?  Pack years: 15.00  ?  Types: Cigarettes  ?  Quit date: 02/21/2015  ?  Years since quitting: 7.1  ? Smokeless tobacco: Never  ?Vaping Use  ? Vaping Use: Former  ? Quit date: 01/01/2019  ?Substance and Sexual Activity  ? Alcohol use: Yes  ?  Alcohol/week: 2.0 standard drinks  ?  Types: 2 Cans of beer per week  ?  Comment: Occasional   ? Drug use: No  ? Sexual activity: Yes  ?  Partners: Male  ?  Comment: 1 female  ?Other Topics Concern  ? Not on file  ?Social History Narrative  ? Works- Engineer, building services in [place]   ? Lives with husband   ? Children- 4 step children   ? Pets: None   ? Caffeine- Tea 3 cups and Coffee 2 cups   ? ?Social Determinants of Health  ? ?Financial Resource Strain: Not on file  ?Food  Insecurity: Not on file  ?Transportation Needs: Not on file  ?Physical Activity: Not on file  ?Stress: Not on file  ?Social Connections: Not on file  ?Intimate Partner Violence: Not on file  ? ? ?Family History  ?Problem Relation Age of Onset  ? Lupus Mother   ? Hypertension Mother   ? Lymphoma Mother 40  ?     B cell; currently 70  ? Hypertension Father   ?     currently 31  ? Diabetes Father   ? Breast cancer Paternal Aunt 37  ?     deceased 29  ? Lung cancer Paternal Aunt 54  ?     deceased 70  ? Brain cancer Paternal Grandfather 21  ?     deceased 44  ? ? ? ?Current Outpatient Medications:  ?  hydrOXYzine (ATARAX/VISTARIL) 10 MG tablet, TAKE 1 TABLET BY MOUTH EVERYDAY AT BEDTIME, Disp: 30 tablet, Rfl: 8 ?  polyethylene glycol (MIRALAX / GLYCOLAX) 17 g packet,  Take 17 g by mouth as needed., Disp: , Rfl:  ?  benzonatate (TESSALON) 100 MG capsule, Take 2 capsules (200 mg total) by mouth every 8 (eight) hours. (Patient not taking: Reported on 04/17/2022), Disp: 21 capsule, Rfl: 0 ?  ipratropium (ATROVENT) 0.06 % nasal spray, Place 2 sprays into both nostrils 4 (four) times daily. (Patient not taking: Reported on 04/17/2022), Disp: 15 mL, Rfl: 12 ?  promethazine-dextromethorphan (PROMETHAZINE-DM) 6.25-15 MG/5ML syrup, Take 5 mLs by mouth 4 (four) times daily as needed. (Patient not taking: Reported on 04/17/2022), Disp: 118 mL, Rfl: 0 ? ?Physical exam:  ?Vitals:  ? 04/17/22 1036  ?BP: 131/87  ?Pulse: 76  ?Resp: 16  ?Temp: (!) 97.3 ?F (36.3 ?C)  ?SpO2: 98%  ?Weight: 155 lb 8 oz (70.5 kg)  ? ?Physical Exam ?Constitutional:   ?   General: She is not in acute distress. ?Cardiovascular:  ?   Rate and Rhythm: Normal rate and regular rhythm.  ?   Heart sounds: Normal heart sounds.  ?Pulmonary:  ?   Effort: Pulmonary effort is normal.  ?   Breath sounds: Normal breath sounds.  ?Abdominal:  ?   General: Bowel sounds are normal.  ?   Palpations: Abdomen is soft.  ?Skin: ?   General: Skin is warm and dry.  ?Neurological:  ?    Mental Status: She is alert and oriented to person, place, and time.  ?  ? ? ?  Latest Ref Rng & Units 11/17/2021  ?  8:52 AM  ?CMP  ?Glucose 70 - 99 mg/dL 95    ?BUN 6 - 23 mg/dL 15    ?Creatinine 0.40 - 1.20 mg/dL 0

## 2022-04-21 ENCOUNTER — Encounter: Payer: Self-pay | Admitting: Emergency Medicine

## 2022-04-21 ENCOUNTER — Ambulatory Visit
Admission: EM | Admit: 2022-04-21 | Discharge: 2022-04-21 | Disposition: A | Discharge status: Home / Self Care | Payer: BC Managed Care – PPO | Attending: Physician Assistant | Admitting: Physician Assistant

## 2022-04-21 ENCOUNTER — Other Ambulatory Visit: Payer: Self-pay

## 2022-04-21 DIAGNOSIS — J069 Acute upper respiratory infection, unspecified: Secondary | ICD-10-CM | POA: Diagnosis not present

## 2022-04-21 MED ORDER — CHERATUSSIN AC 100-10 MG/5ML PO SOLN: 10.0000 mL | Freq: Three times a day (TID) | ORAL | 0 refills | Status: DC | PRN

## 2022-04-21 NOTE — ED Provider Notes (Signed)
?[facility] ? ? ? ?CSN: [id number] ?Arrival date & time: 04/21/22  0846 ? ? ?  ? ?History   ?Chief Complaint ?Chief Complaint  ?Patient presents with  ? Cough  ? ? ?HPI ?Melinda Holder is a 51 y.o. female presenting for 1 week history of nasal congestion and cough.  Patient says "I am going on my second week of being sick."  She denies any associated fevers or fatigue.  Patient reports nasal congestion has improved and so is her sinus pressure with use of nasal sprays and Sudafed.  She has also been taking Mucinex.  States cough is nonproductive but she feels like she needs to cough something up.  Denies any breathing difficulty or wheezing, vomiting or diarrhea.  No sick contacts or known exposure to COVID-19.  Has not tested herself.  History of childhood asthma but not as an adult.  No history of any cardiopulmonary diseases.  No other complaints. ? ?HPI ? ?Past Medical History:  ?Diagnosis Date  ? Amblyopia of eye, right   ? Anemia   ? Asthma   ? childhood  ? Breast cancer ([place]) 2019  ? Cancer ([place]) 10/27/2018  ? left breast  ? Difficult intubation 11/03/2016  ? DL x 2 with MAC 3 (CRNA and MD), very anterior. DL x 2 with Glidescope #3 Lo pro, bougie utilized. Easy mask.  ? Endometriosis   ? GERD (gastroesophageal reflux disease)   ? Migraine   ? Personal history of radiation therapy   ? ? ?Patient Active Problem List  ? Diagnosis Date Noted  ? Breast lesion 12/13/2020  ? Chronic headaches 11/14/2020  ? Need for prophylactic vaccination and inoculation against influenza 09/20/2019  ? 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[place])   ? Malignant neoplasm of left female breast ([place]) 11/03/2018  ? Iron deficiency anemia 05/20/2017  ? Routine general medical examination at a health care facility 02/27/2016  ? Overweight (BMI 25.0-29.9) 09/03/2015  ? ? ?Past Surgical History:  ?Procedure Laterality Date  ? ABLATION ON ENDOMETRIOSIS    ? BREAST BIOPSY Left 10/28/2018  ? INVASIVE MAMMARY  CARCINOMA  ? BREAST EXCISIONAL BIOPSY Left 10/28/2018  ? BREAST LUMPECTOMY Left 11/17/2018  ? INVASIVE MAMMARY CARCINOMA  ? BREAST LUMPECTOMY Left 12/19/2018  ? re-excision Procedure: BREAST LUMPECTOMY;  Surgeon: Jules Husbands, MD;  Location: [facility];  Service: General;  Laterality: Left;  ? BREAST LUMPECTOMY WITH NEEDLE LOCALIZATION Left 11/17/2018  ? Procedure: BREAST LUMPECTOMY WITH NEEDLE LOCALIZATION;  Surgeon: Jules Husbands, MD;  Location: [facility];  Service: General;  Laterality: Left;  ? CLAVICLE SURGERY Left 10/2016  ? COLONOSCOPY WITH PROPOFOL N/A 06/27/2018  ? Procedure: COLONOSCOPY WITH PROPOFOL;  Surgeon: Lucilla Lame, MD;  Location: [place];  Service: Endoscopy;  Laterality: N/A;  ? ESOPHAGOGASTRODUODENOSCOPY (EGD) WITH PROPOFOL N/A 06/27/2018  ? Procedure: ESOPHAGOGASTRODUODENOSCOPY (EGD) WITH PROPOFOL;  Surgeon: Lucilla Lame, MD;  Location: [place];  Service: Endoscopy;  Laterality: N/A;  ? EYE MUSCLE SURGERY Right   ? child  ? ORIF CLAVICULAR FRACTURE Left 11/03/2016  ? Procedure: OPEN REDUCTION INTERNAL FIXATION (ORIF) CLAVICULAR FRACTURE;  Surgeon: Corky Mull, MD;  Location: [facility];  Service: Orthopedics;  Laterality: Left;  ? removal of fibroid    ? SENTINEL NODE BIOPSY Left 11/17/2018  ? Procedure: SENTINEL NODE BIOPSY;  Surgeon: Jules Husbands, MD;  Location: [facility];  Service: General;  Laterality: Left;  ? ? ?OB History   ? ?  Gravida  ?2  ? Para  ?0  ? Term  ?0  ? Preterm  ?0  ? AB  ?2  ? Living  ?0  ?  ? ? SAB  ?0  ? IAB  ?0  ? Ectopic  ?0  ? Multiple  ?0  ? Live Births  ?0  ?   ?  ? Obstetric Comments  ?1st Menstrual Cycle:  14 ?  ?  ?  ? ?  ? ? ? ?Home Medications   ? ?Prior to Admission medications   ?Medication Sig Start Date End Date Taking? Authorizing Provider  ?guaiFENesin-codeine (CHERATUSSIN AC) 100-10 MG/5ML syrup Take 10 mLs by mouth 3 (three) times daily as needed for cough. 04/21/22  Yes Laurene Footman B, PA-C  ?hydrOXYzine (ATARAX/VISTARIL) 10 MG  tablet TAKE 1 TABLET BY MOUTH EVERYDAY AT BEDTIME 08/13/21  Yes Leone Haven, MD  ?ipratropium (ATROVENT) 0.06 % nasal spray Place 2 sprays into both nostrils 4 (four) times daily. ?Patient not taking: Reported on 04/17/2022 02/02/22   Margarette Canada, NP  ?polyethylene glycol (MIRALAX / GLYCOLAX) 17 g packet Take 17 g by mouth as needed.    [provider]  ?fluticasone (FLONASE) 50 MCG/ACT nasal spray Place 2 sprays into both nostrils daily. 08/26/20 02/18/21  Melynda Ripple, MD  ? ? ?Family History ?Family History  ?Problem Relation Age of Onset  ? Lupus Mother   ? Hypertension Mother   ? Lymphoma Mother 42  ?     B cell; currently 70  ? Hypertension Father   ?     currently 35  ? Diabetes Father   ? Breast cancer Paternal Aunt 87  ?     deceased 5  ? Lung cancer Paternal Aunt 14  ?     deceased 52  ? Brain cancer Paternal Grandfather 55  ?     deceased 14  ? ? ?Social History ?Social History  ? ?Tobacco Use  ? Smoking status: Former  ?  Packs/day: 1.00  ?  Years: 15.00  ?  Pack years: 15.00  ?  Types: Cigarettes  ?  Quit date: 02/21/2015  ?  Years since quitting: 7.1  ? Smokeless tobacco: Never  ?Vaping Use  ? Vaping Use: Former  ? Quit date: 01/01/2019  ?Substance Use Topics  ? Alcohol use: Yes  ?  Alcohol/week: 2.0 standard drinks  ?  Types: 2 Cans of beer per week  ?  Comment: Occasional   ? Drug use: No  ? ? ? ?Allergies   ?Patient has no known allergies. ? ? ?Review of Systems ?Review of Systems  ?Constitutional:  Negative for chills, diaphoresis, fatigue and fever.  ?HENT:  Positive for congestion and rhinorrhea. Negative for ear pain, sinus pressure, sinus pain and sore throat.   ?Respiratory:  Positive for cough. Negative for shortness of breath.   ?Gastrointestinal:  Negative for abdominal pain, nausea and vomiting.  ?Musculoskeletal:  Negative for arthralgias and myalgias.  ?Skin:  Negative for rash.  ?Neurological:  Negative for weakness and headaches.  ?Hematological:  Negative for adenopathy.   ? ? ?Physical Exam ?Triage Vital Signs ?ED Triage Vitals  ?Enc Vitals Group  ?   BP 04/21/22 0914 125/79  ?   Pulse Rate 04/21/22 0914 84  ?   Resp 04/21/22 0914 16  ?   Temp 04/21/22 0914 97.9 ?F (36.6 ?C)  ?   Temp Source 04/21/22 0914 Oral  ?   SpO2 04/21/22 0914 98 %  ?  Weight 04/21/22 0913 155 lb 6.8 oz (70.5 kg)  ?   Height 04/21/22 0913 5' 2.5" (1.588 m)  ?   Head Circumference --   ?   Peak Flow --   ?   Pain Score 04/21/22 0912 0  ?   Pain Loc --   ?   Pain Edu? --   ?   Excl. in [place]? --   ? ?No data found. ? ?Updated Vital Signs ?BP 125/79 (BP Location: Right Arm)   Pulse 84   Temp 97.9 ?F (36.6 ?C) (Oral)   Resp 16   Ht 5' 2.5" (1.588 m)   Wt 155 lb 6.8 oz (70.5 kg)   SpO2 98%   BMI 27.97 kg/m?  ?     ? ?Physical Exam ?Vitals and nursing note reviewed.  ?Constitutional:   ?   General: She is not in acute distress. ?   Appearance: Normal appearance. She is not ill-appearing or toxic-appearing.  ?HENT:  ?   Head: Normocephalic and atraumatic.  ?   Nose: Congestion present.  ?   Mouth/Throat:  ?   Mouth: Mucous membranes are moist.  ?   Pharynx: Oropharynx is clear.  ?Eyes:  ?   General: No scleral icterus.    ?   Right eye: No discharge.     ?   Left eye: No discharge.  ?   Conjunctiva/sclera: Conjunctivae normal.  ?Cardiovascular:  ?   Rate and Rhythm: Normal rate and regular rhythm.  ?   Heart sounds: Normal heart sounds.  ?Pulmonary:  ?   Effort: Pulmonary effort is normal. No respiratory distress.  ?   Breath sounds: Normal breath sounds. No wheezing, rhonchi or rales.  ?Musculoskeletal:  ?   Cervical back: Neck supple.  ?Skin: ?   General: Skin is dry.  ?Neurological:  ?   General: No focal deficit present.  ?   Mental Status: She is alert. Mental status is at baseline.  ?   Motor: No weakness.  ?   Gait: Gait normal.  ?Psychiatric:     ?   Mood and Affect: Mood normal.     ?   Behavior: Behavior normal.     ?   Thought Content: Thought content normal.  ? ? ? ?UC Treatments / Results   ?Labs ?(all labs ordered are listed, but only abnormal results are displayed) ?Labs Reviewed - No data to display ? ?EKG ? ? ?Radiology ?No results found. ? ?Procedures ?Procedures (including critical care time) ? ?Medications

## 2022-04-21 NOTE — ED Triage Notes (Signed)
Pt c/o cough, runny nose. Started about 2 weeks ago. Denies fever.  ?

## 2022-04-21 NOTE — Discharge Instructions (Addendum)

## 2022-04-28 ENCOUNTER — Ambulatory Visit
Admission: EM | Admit: 2022-04-28 | Discharge: 2022-04-28 | Disposition: A | Discharge status: Home / Self Care | Payer: BC Managed Care – PPO | Attending: Physician Assistant | Admitting: Physician Assistant

## 2022-04-28 DIAGNOSIS — B349 Viral infection, unspecified: Secondary | ICD-10-CM | POA: Diagnosis not present

## 2022-04-28 DIAGNOSIS — R197 Diarrhea, unspecified: Secondary | ICD-10-CM | POA: Insufficient documentation

## 2022-04-28 DIAGNOSIS — Z20822 Contact with and (suspected) exposure to covid-19: Secondary | ICD-10-CM | POA: Insufficient documentation

## 2022-04-28 DIAGNOSIS — R509 Fever, unspecified: Secondary | ICD-10-CM | POA: Diagnosis not present

## 2022-04-28 LAB — RESP PANEL BY RT-PCR (FLU A&B, COVID) ARPGX2
Influenza A by PCR: NEGATIVE
Influenza B by PCR: NEGATIVE
SARS Coronavirus 2 by RT PCR: NEGATIVE

## 2022-04-28 MED ORDER — ALBUTEROL SULFATE HFA 108 (90 BASE) MCG/ACT IN AERS
2.0000 | INHALATION_SPRAY | Freq: Once | RESPIRATORY_TRACT | Status: AC
Start: 2022-04-28 — End: 2022-04-28
  Administered 2022-04-28: 2 via RESPIRATORY_TRACT

## 2022-04-28 MED ORDER — ONDANSETRON 4 MG PO TBDP: 4.0000 mg | ORAL_TABLET | Freq: Once | ORAL | Status: DC

## 2022-04-28 MED ORDER — ONDANSETRON 4 MG PO TBDP
4.0000 mg | ORAL_TABLET | Freq: Once | ORAL | Status: AC
  Administered 2022-04-28: 4 mg via ORAL

## 2022-04-28 MED ORDER — ONDANSETRON HCL 4 MG PO TABS: 4.0000 mg | ORAL_TABLET | Freq: Every day | ORAL | 0 refills | Status: AC | PRN

## 2022-04-28 MED ORDER — ACETAMINOPHEN 325 MG PO TABS
650.0000 mg | ORAL_TABLET | Freq: Four times a day (QID) | ORAL | Status: DC | PRN
  Administered 2022-04-28: 650 mg via ORAL

## 2022-04-28 NOTE — ED Provider Notes (Signed)
?[facility] ? ? ? ?CSN: [id number] ?Arrival date & time: 04/28/22  0808 ? ? ?  ? ?History   ?Chief Complaint ?Chief Complaint  ?Patient presents with  ? Fever  ? Diarrhea  ? Nausea  ? ? ?HPI ?Melinda Holder is a 51 y.o. female.  ? ?The history is provided by the patient. No language interpreter was used.  ?Fever ?Temp source:  Subjective ?Severity:  Moderate ?Onset quality:  Gradual ?Duration:  1 day ?Timing:  Constant ?Progression:  Worsening ?Chronicity:  New ?Relieved by:  Nothing ?Worsened by:  Nothing ?Associated symptoms: diarrhea   ?Diarrhea ?Severity:  Moderate ?Onset quality:  Gradual ?Progression:  Worsening ?Relieved by:  Nothing ?Worsened by:  Nothing ?Ineffective treatments:  None tried ?Associated symptoms: fever   ? ?Past Medical History:  ?Diagnosis Date  ? Amblyopia of eye, right   ? Anemia   ? Asthma   ? childhood  ? Breast cancer ([place]) 2019  ? Cancer ([place]) 10/27/2018  ? left breast  ? Difficult intubation 11/03/2016  ? DL x 2 with MAC 3 (CRNA and MD), very anterior. DL x 2 with Glidescope #3 Lo pro, bougie utilized. Easy mask.  ? Endometriosis   ? GERD (gastroesophageal reflux disease)   ? Migraine   ? Personal history of radiation therapy   ? ? ?Patient Active Problem List  ? Diagnosis Date Noted  ? Breast lesion 12/13/2020  ? Chronic headaches 11/14/2020  ? Need for prophylactic vaccination and inoculation against influenza 09/20/2019  ? 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[place])   ? Malignant neoplasm of left female breast ([place]) 11/03/2018  ? Iron deficiency anemia 05/20/2017  ? Routine general medical examination at a health care facility 02/27/2016  ? Overweight (BMI 25.0-29.9) 09/03/2015  ? ? ?Past Surgical History:  ?Procedure Laterality Date  ? ABLATION ON ENDOMETRIOSIS    ? BREAST BIOPSY Left 10/28/2018  ? INVASIVE MAMMARY CARCINOMA  ? BREAST EXCISIONAL BIOPSY Left 10/28/2018  ? BREAST LUMPECTOMY Left 11/17/2018  ? INVASIVE MAMMARY CARCINOMA  ? BREAST  LUMPECTOMY Left 12/19/2018  ? re-excision Procedure: BREAST LUMPECTOMY;  Surgeon: Jules Husbands, MD;  Location: [facility];  Service: General;  Laterality: Left;  ? BREAST LUMPECTOMY WITH NEEDLE LOCALIZATION Left 11/17/2018  ? Procedure: BREAST LUMPECTOMY WITH NEEDLE LOCALIZATION;  Surgeon: Jules Husbands, MD;  Location: [facility];  Service: General;  Laterality: Left;  ? CLAVICLE SURGERY Left 10/2016  ? COLONOSCOPY WITH PROPOFOL N/A 06/27/2018  ? Procedure: COLONOSCOPY WITH PROPOFOL;  Surgeon: Lucilla Lame, MD;  Location: [facility];  Service: Endoscopy;  Laterality: N/A;  ? ESOPHAGOGASTRODUODENOSCOPY (EGD) WITH PROPOFOL N/A 06/27/2018  ? Procedure: ESOPHAGOGASTRODUODENOSCOPY (EGD) WITH PROPOFOL;  Surgeon: Lucilla Lame, MD;  Location: [facility];  Service: Endoscopy;  Laterality: N/A;  ? EYE MUSCLE SURGERY Right   ? child  ? ORIF CLAVICULAR FRACTURE Left 11/03/2016  ? Procedure: OPEN REDUCTION INTERNAL FIXATION (ORIF) CLAVICULAR FRACTURE;  Surgeon: Corky Mull, MD;  Location: [facility];  Service: Orthopedics;  Laterality: Left;  ? removal of fibroid    ? SENTINEL NODE BIOPSY Left 11/17/2018  ? Procedure: SENTINEL NODE BIOPSY;  Surgeon: Jules Husbands, MD;  Location: [facility];  Service: General;  Laterality: Left;  ? ? ?OB History   ? ? Gravida  ?2  ? Para  ?0  ? Term  ?0  ? Preterm  ?0  ? AB  ?2  ? Living  ?0  ?  ? ?  SAB  ?0  ? IAB  ?0  ? Ectopic  ?0  ? Multiple  ?0  ? Live Births  ?0  ?   ?  ? Obstetric Comments  ?1st Menstrual Cycle:  14 ?  ?  ?  ? ?  ? ? ? ?Home Medications   ? ?Prior to Admission medications   ?Medication Sig Start Date End Date Taking? Authorizing Provider  ?guaiFENesin-codeine (CHERATUSSIN AC) 100-10 MG/5ML syrup Take 10 mLs by mouth 3 (three) times daily as needed for cough. 04/21/22   Laurene Footman B, PA-C  ?hydrOXYzine (ATARAX/VISTARIL) 10 MG tablet TAKE 1 TABLET BY MOUTH EVERYDAY AT BEDTIME 08/13/21   Leone Haven, MD  ?ipratropium (ATROVENT) 0.06 % nasal spray Place 2  sprays into both nostrils 4 (four) times daily. ?Patient not taking: Reported on 04/17/2022 02/02/22   Margarette Canada, NP  ?polyethylene glycol (MIRALAX / GLYCOLAX) 17 g packet Take 17 g by mouth as needed.    [provider]  ?fluticasone (FLONASE) 50 MCG/ACT nasal spray Place 2 sprays into both nostrils daily. 08/26/20 02/18/21  Melynda Ripple, MD  ? ? ?Family History ?Family History  ?Problem Relation Age of Onset  ? Lupus Mother   ? Hypertension Mother   ? Lymphoma Mother 37  ?     B cell; currently 70  ? Hypertension Father   ?     currently 60  ? Diabetes Father   ? Breast cancer Paternal Aunt 43  ?     deceased 15  ? Lung cancer Paternal Aunt 55  ?     deceased 62  ? Brain cancer Paternal Grandfather 47  ?     deceased 44  ? ? ?Social History ?Social History  ? ?Tobacco Use  ? Smoking status: Former  ?  Packs/day: 1.00  ?  Years: 15.00  ?  Pack years: 15.00  ?  Types: Cigarettes  ?  Quit date: 02/21/2015  ?  Years since quitting: 7.1  ? Smokeless tobacco: Never  ?Vaping Use  ? Vaping Use: Former  ? Quit date: 01/01/2019  ?Substance Use Topics  ? Alcohol use: Yes  ?  Alcohol/week: 2.0 standard drinks  ?  Types: 2 Cans of beer per week  ?  Comment: Occasional   ? Drug use: No  ? ? ? ?Allergies   ?Patient has no known allergies. ? ? ?Review of Systems ?Review of Systems  ?Constitutional:  Positive for fever.  ?Gastrointestinal:  Positive for diarrhea.  ?All other systems reviewed and are negative. ? ? ?Physical Exam ?Triage Vital Signs ?ED Triage Vitals  ?Enc Vitals Group  ?   BP 04/28/22 0815 113/66  ?   Pulse Rate 04/28/22 0815 87  ?   Resp 04/28/22 0815 16  ?   Temp 04/28/22 0815 98.1 ?F (36.7 ?C)  ?   Temp Source 04/28/22 0815 Oral  ?   SpO2 04/28/22 0815 93 %  ?   Weight --   ?   Height --   ?   Head Circumference --   ?   Peak Flow --   ?   Pain Score 04/28/22 0820 8  ?   Pain Loc --   ?   Pain Edu? --   ?   Excl. in [place]? --   ? ?No data found. ? ?Updated Vital Signs ?BP 113/66 (BP Location: Right Arm)    Pulse 87   Temp 98.1 ?F (36.7 ?C) (Oral)   Resp  16   SpO2 93%  ? ?Visual Acuity ?Right Eye Distance:   ?Left Eye Distance:   ?Bilateral Distance:   ? ?Right Eye Near:   ?Left Eye Near:    ?Bilateral Near:    ? ?Physical Exam ?Vitals and nursing note reviewed.  ?Constitutional:   ?   Appearance: She is well-developed.  ?HENT:  ?   Head: Normocephalic.  ?Cardiovascular:  ?   Rate and Rhythm: Normal rate and regular rhythm.  ?Pulmonary:  ?   Effort: Pulmonary effort is normal.  ?Abdominal:  ?   General: There is no distension.  ?Musculoskeletal:     ?   General: Normal range of motion.  ?   Cervical back: Normal range of motion.  ?Skin: ?   General: Skin is warm.  ?Neurological:  ?   Mental Status: She is alert and oriented to person, place, and time.  ?Psychiatric:     ?   Mood and Affect: Mood normal.  ? ? ? ?UC Treatments / Results  ?Labs ?(all labs ordered are listed, but only abnormal results are displayed) ?Labs Reviewed  ?RESP PANEL BY RT-PCR (FLU A&B, COVID) [id number]  ? ? ?EKG ? ? ?Radiology ?No results found. ? ?Procedures ?Procedures (including critical care time) ? ?Medications Ordered in UC ?Medications  ?acetaminophen (TYLENOL) tablet 650 mg (650 mg Oral Given 04/28/22 0858)  ?ondansetron (ZOFRAN-ODT) disintegrating tablet 4 mg (4 mg Oral Given 04/28/22 0826)  ?albuterol (VENTOLIN HFA) 108 (90 Base) MCG/ACT inhaler 2 puff (2 puffs Inhalation Given 04/28/22 0858)  ? ? ?Initial Impression / Assessment and Plan / UC Course  ?I have reviewed the triage vital signs and the nursing notes. ? ?Pertinent labs & imaging results that were available during my care of the patient were reviewed by me and considered in my medical decision making (see chart for details). ? ?  ? ?Covid pending   ?Final Clinical Impressions(s) / UC Diagnoses  ? ?Final diagnoses:  ?Viral illness  ? ? ? ?Discharge Instructions   ? ?  ?Return if any problems.  Tylenol every 4 hours.  Zofran as needed.  Use your inhaler 2 pufffs every 4 hours   ? ? ? ?ED Prescriptions   ? ? Medication Sig Dispense Auth. Provider  ? ondansetron (ZOFRAN) 4 MG tablet Take 1 tablet (4 mg total) by mouth daily as needed for nausea or vomiting. 20 tablet Fr[place]ico [place], [place]  ? ?  ?

## 2022-04-28 NOTE — Discharge Instructions (Addendum)
Return if any problems.  Tylenol every 4 hours.  Zofran as needed.  Use your inhaler 2 pufffs every 4 hours  ?

## 2022-04-28 NOTE — ED Triage Notes (Signed)
Patient presents to Urgent Care with complaints of SOB, cough, nausea, fever, and diarrhea since yesterday. Pt states last known fever yesterday. She states she was seen 04/25 for cough that has not resolved. Treating symptoms with cough med and tylenol.  ?

## 2022-06-30 ENCOUNTER — Other Ambulatory Visit: Payer: Self-pay | Admitting: Family Medicine

## 2022-07-06 ENCOUNTER — Other Ambulatory Visit: Payer: Self-pay | Admitting: Family Medicine

## 2022-10-19 ENCOUNTER — Inpatient Hospital Stay: Payer: BC Managed Care – PPO | Attending: Nurse Practitioner

## 2022-10-19 DIAGNOSIS — D509 Iron deficiency anemia, unspecified: Secondary | ICD-10-CM | POA: Diagnosis not present

## 2022-10-19 DIAGNOSIS — Z08 Encounter for follow-up examination after completed treatment for malignant neoplasm: Secondary | ICD-10-CM

## 2022-10-19 DIAGNOSIS — Z853 Personal history of malignant neoplasm of breast: Secondary | ICD-10-CM | POA: Diagnosis not present

## 2022-10-19 LAB — CBC WITH DIFFERENTIAL/PLATELET
Abs Immature Granulocytes: 0.02 10*3/uL (ref 0.00–0.07)
Basophils Absolute: 0 10*3/uL (ref 0.0–0.1)
Basophils Relative: 0 %
Eosinophils Absolute: 0.3 10*3/uL (ref 0.0–0.5)
Eosinophils Relative: 5 %
HCT: 38.9 % (ref 36.0–46.0)
Hemoglobin: 13.1 g/dL (ref 12.0–15.0)
Immature Granulocytes: 0 %
Lymphocytes Relative: 29 %
Lymphs Abs: 1.8 10*3/uL (ref 0.7–4.0)
MCH: 31.3 pg (ref 26.0–34.0)
MCHC: 33.7 g/dL (ref 30.0–36.0)
MCV: 92.8 fL (ref 80.0–100.0)
Monocytes Absolute: 0.6 10*3/uL (ref 0.1–1.0)
Monocytes Relative: 10 %
Neutro Abs: 3.5 10*3/uL (ref 1.7–7.7)
Neutrophils Relative %: 56 %
Platelets: 378 10*3/uL (ref 150–400)
RBC: 4.19 MIL/uL (ref 3.87–5.11)
RDW: 13.1 % (ref 11.5–15.5)
WBC: 6.3 10*3/uL (ref 4.0–10.5)
nRBC: 0 % (ref 0.0–0.2)

## 2022-10-19 LAB — FERRITIN: Ferritin: 42 ng/mL (ref 11–307)

## 2022-10-19 LAB — IRON AND TIBC
Iron: 77 ug/dL (ref 28–170)
Saturation Ratios: 23 % (ref 10.4–31.8)
TIBC: 333 ug/dL (ref 250–450)
UIBC: 256 ug/dL

## 2022-10-20 ENCOUNTER — Inpatient Hospital Stay (HOSPITAL_BASED_OUTPATIENT_CLINIC_OR_DEPARTMENT_OTHER): Payer: BC Managed Care – PPO | Admitting: Nurse Practitioner

## 2022-10-20 ENCOUNTER — Encounter: Payer: Self-pay | Admitting: Nurse Practitioner

## 2022-10-20 DIAGNOSIS — Z08 Encounter for follow-up examination after completed treatment for malignant neoplasm: Secondary | ICD-10-CM | POA: Diagnosis not present

## 2022-10-20 DIAGNOSIS — Z853 Personal history of malignant neoplasm of breast: Secondary | ICD-10-CM | POA: Diagnosis not present

## 2022-10-20 DIAGNOSIS — D509 Iron deficiency anemia, unspecified: Secondary | ICD-10-CM

## 2022-10-20 DIAGNOSIS — Z862 Personal history of diseases of the blood and blood-forming organs and certain disorders involving the immune mechanism: Secondary | ICD-10-CM

## 2022-10-20 NOTE — Progress Notes (Signed)
Hematology/Oncology Consult Note [facility]  Telephone:([phone] [phone]  Patient Care Team: Leone Haven, MD as PCP - General (Family Medicine) Noreene Filbert, MD as Radiation Oncologist (Radiation Oncology) Verlon Au, NP as Nurse Practitioner (Nurse Practitioner)   Name of the patient: Melinda Holder  [id number]  14-Nov-1971   Virtual Visit Progress Note  I connected with Melinda Holder on 10/20/22 at  3:00 PM EDT by video enabled telemedicine visit and verified that I am speaking with the correct person using two identifiers.   I discussed the limitations, risks, security and privacy concerns of performing an evaluation and management service by telemedicine and the availability of in-person appointments. I also discussed with the patient that there may be a patient responsible charge related to this service. The patient expressed understanding and agreed to proceed.   Other persons participating in the visit and their role in the encounter: none   Patient's location: work  Provider's location: clinic   Date of visit: 10/20/22  Diagnosis-history of breast cancer and iron deficiency anemia  Chief complaint/ Reason for visit- routine follow-up of breast cancer and iron deficiency anemia  Heme/Onc history: Patient is a 51 year old premenopausal woman who sees me for her iron deficiency anemia.  She has received Feraheme in the past last in March 2019 and her hemoglobin had improved.  Patient also has thrombocytosis dating back to 2017.  Jak 2 mutation testing has been negative.  Bone marrow biopsy not done at this time as she is less than 81 years of age with no prior history of thrombosis.   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  OS  plus AI was recommended without adjuvant chemotherapy.  Patient could not tolerate Lupron and was therefore switched to tamoxifen.  Patient would also not tolerate tamoxifen due to worsening headaches and stopped taking it.  Currently patient is not taking any antihormonal therapy.   For iron deficiency anemia she has been getting periodic IV iron.     Interval history- Patient agrees to evaluation via telemedicine for follow up of iron deficiency and breast cancer surveillance. She denies breast complaints. Performs self exams. Has not had menstrual period in 2 months. Feels well and denies complaints.   ECOG PS- 0 Pain scale- 0  Review of systems- Review of Systems  Constitutional:  Negative for chills, fever, malaise/fatigue and weight loss.  HENT:  Negative for congestion, ear discharge and nosebleeds.   Eyes:  Negative for blurred vision.  Respiratory:  Negative for cough, hemoptysis, sputum production, shortness of breath and wheezing.   Cardiovascular:  Negative for chest pain, palpitations, orthopnea and claudication.  Gastrointestinal:  Negative for abdominal pain, blood in stool, constipation, diarrhea, heartburn, melena, nausea and vomiting.  Genitourinary:  Negative for dysuria, flank pain, frequency, hematuria and urgency.  Musculoskeletal:  Negative for back pain, joint pain and myalgias.  Skin:  Negative for rash.  Neurological:  Negative for dizziness, tingling, focal weakness, seizures, weakness and headaches.  Endo/Heme/Allergies:  Does not bruise/bleed easily.  Psychiatric/Behavioral:  Negative for depression and suicidal ideas. The patient does not have insomnia.     No Known Allergies  Past Medical History:  Diagnosis Date   Amblyopia of eye, right    Anemia    Asthma    childhood   Breast  cancer ([place]) 2019   Cancer ([place]) 10/27/2018   left breast   Difficult intubation 11/03/2016   DL x 2 with MAC 3 (CRNA and MD), very anterior. DL x 2 with Glidescope #3 Lo  pro, bougie utilized. Easy mask.   Endometriosis    GERD (gastroesophageal reflux disease)    Migraine    Personal history of radiation therapy    Past Surgical History:  Procedure Laterality Date   ABLATION ON ENDOMETRIOSIS     BREAST BIOPSY Left 10/28/2018   INVASIVE MAMMARY CARCINOMA   BREAST EXCISIONAL BIOPSY Left 10/28/2018   BREAST LUMPECTOMY Left 11/17/2018   INVASIVE MAMMARY CARCINOMA   BREAST LUMPECTOMY Left 12/19/2018   re-excision Procedure: BREAST LUMPECTOMY;  Surgeon: Jules Husbands, MD;  Location: [facility];  Service: General;  Laterality: Left;   BREAST LUMPECTOMY WITH NEEDLE LOCALIZATION Left 11/17/2018   Procedure: BREAST LUMPECTOMY WITH NEEDLE LOCALIZATION;  Surgeon: Jules Husbands, MD;  Location: [facility];  Service: General;  Laterality: Left;   CLAVICLE SURGERY Left 10/2016   COLONOSCOPY WITH PROPOFOL N/A 06/27/2018   Procedure: COLONOSCOPY WITH PROPOFOL;  Surgeon: Lucilla Lame, MD;  Location: [facility];  Service: Endoscopy;  Laterality: N/A;   ESOPHAGOGASTRODUODENOSCOPY (EGD) WITH PROPOFOL N/A 06/27/2018   Procedure: ESOPHAGOGASTRODUODENOSCOPY (EGD) WITH PROPOFOL;  Surgeon: Lucilla Lame, MD;  Location: [facility];  Service: Endoscopy;  Laterality: N/A;   EYE MUSCLE SURGERY Right    child   ORIF CLAVICULAR FRACTURE Left 11/03/2016   Procedure: OPEN REDUCTION INTERNAL FIXATION (ORIF) CLAVICULAR FRACTURE;  Surgeon: Corky Mull, MD;  Location: [facility];  Service: Orthopedics;  Laterality: Left;   removal of fibroid     SENTINEL NODE BIOPSY Left 11/17/2018   Procedure: SENTINEL NODE BIOPSY;  Surgeon: Jules Husbands, MD;  Location: [facility];  Service: General;  Laterality: Left;    Social History   Socioeconomic History   Marital status: Married    Spouse name: 0   Number of children: 0   Years of education: Not on file   Highest education level: Associate degree: academic program  Occupational History   Occupation: Librarian, academic    Comment:  [employer]  Tobacco Use   Smoking status: Former    Packs/day: 1.00    Years: 15.00    Total pack years: 15.00    Types: Cigarettes    Quit date: 02/21/2015    Years since quitting: 7.6   Smokeless tobacco: Never  Vaping Use   Vaping Use: Former   Quit date: 01/01/2019  Substance and Sexual Activity   Alcohol use: Yes    Alcohol/week: 2.0 standard drinks of alcohol    Types: 2 Cans of beer per week    Comment: Occasional    Drug use: No   Sexual activity: Yes    Partners: Male    Comment: 1 female  Other Topics Concern   Not on file  Social History Narrative   Works- Engineer, 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    Children- 4 step children    Pets: None    Caffeine- Tea 3 cups and Coffee 2 cups    Social Determinants of Health   Financial Resource Strain: Not on file  Food Insecurity: Not on file  Transportation Needs: Not on file  Physical Activity: Not on file  Stress: Not on file  Social Connections: Not on file  Intimate Partner Violence: Not on file    Family History  Problem Relation Age  of Onset   Lupus Mother    Hypertension Mother    Lymphoma Mother 61       B cell; currently 7   Hypertension Father        currently 37   Diabetes Father    Breast cancer Paternal Aunt 85       deceased 69   Lung cancer Paternal Aunt 90       deceased 55   Brain cancer Paternal Grandfather 60       deceased 76     Current Outpatient Medications:    guaiFENesin-codeine (CHERATUSSIN AC) 100-10 MG/5ML syrup, Take 10 mLs by mouth 3 (three) times daily as needed for cough., Disp: 120 mL, Rfl: 0   hydrOXYzine (ATARAX) 10 MG tablet, TAKE 1 TABLET BY MOUTH EVERYDAY AT BEDTIME, Disp: 90 tablet, Rfl: 3   ipratropium (ATROVENT) 0.06 % nasal spray, Place 2 sprays into both nostrils 4 (four) times daily. (Patient taking differently: Place 2 sprays into both nostrils 4 (four) times daily as needed.), Disp: 15 mL, Rfl: 12   polyethylene glycol (MIRALAX / GLYCOLAX)  17 g packet, Take 17 g by mouth as needed., Disp: , Rfl:    ondansetron (ZOFRAN) 4 MG tablet, Take 1 tablet (4 mg total) by mouth daily as needed for nausea or vomiting. (Patient not taking: Reported on 10/20/2022), Disp: 20 tablet, Rfl: 0  Physical exam:  There were no vitals filed for this visit.  Physical Exam Constitutional:      General: She is not in acute distress. HENT:     Head: Normocephalic.  Pulmonary:     Effort: No respiratory distress.  Neurological:     Mental Status: She is alert and oriented to person, place, and time.  Psychiatric:        Mood and Affect: Mood normal.        Behavior: Behavior normal.        Latest Ref Rng & Units 11/17/2021    8:52 AM  CMP  Glucose 70 - 99 mg/dL 95   BUN 6 - 23 mg/dL 15   Creatinine 0.40 - 1.20 mg/dL 0.63   Sodium 135 - 145 mEq/L 137   Potassium 3.5 - 5.1 mEq/L 4.2   Chloride 96 - 112 mEq/L 103   CO2 19 - 32 mEq/L 26   Calcium 8.4 - 10.5 mg/dL 9.4   Total Protein 6.0 - 8.3 g/dL 7.3   Total Bilirubin 0.2 - 1.2 mg/dL 0.3   Alkaline Phos 39 - 117 U/L 41   AST 0 - 37 U/L 20   ALT 0 - 35 U/L 16       Latest Ref Rng & Units 10/19/2022    2:18 PM  CBC  WBC 4.0 - 10.5 K/uL 6.3   Hemoglobin 12.0 - 15.0 g/dL 13.1   Hematocrit 36.0 - 46.0 % 38.9   Platelets 150 - 400 K/uL 378    Iron/TIBC/Ferritin/ %Sat    Component Value Date/Time   IRON 77 10/19/2022 1418   TIBC 333 10/19/2022 1418   FERRITIN 42 10/19/2022 1418   IRONPCTSAT 23 10/19/2022 1418   IRONPCTSAT 5 (L) 06/04/2017 0955     Assessment and plan- Patient is a 51 y.o. female who is here for follow-up of following issues:  History of stage I left breast cancer: Diagnosed October 2019. S/p lumpectomy and adjuvant radiation. Declined hormonal therapy. Clinically asymptomatic. She has annual mammogram scheduled for 10/28/22. Plan to repeat in November 2024 if benign/negative. Clinically  she remains asymptomatic. Will plan for in person visit next time for breast  exam.   History of iron deficiency anemia: secondary to heavy, irregular menses. Currently no periods x 2 months. Possibly perimenopausal. Not presently anemic and iron studies and ferritin are well replenished. Last venofer 11/25/21. No iv iron for now.   Disposition:  6 mo- labs (cbc, ferritin, iron studies), Dr Janese Banks, +/- venofer- la  I discussed the assessment and treatment plan with the patient. The patient was provided an opportunity to ask questions and all were answered. The patient agreed with the plan and demonstrated an understanding of the instructions.   The patient was advised to call back or seek an in-person evaluation if the symptoms worsen or if the condition fails to improve as anticipated.   I spent 20 minutes face-to-face video visit time dedicated to the care of this patient on the date of this encounter to include pre-visit review of <specify which records>, face-to-face time with the patient, and post visit ordering of testing/documentation.    Visit Diagnosis 1. Iron deficiency anemia, unspecified iron deficiency anemia type   2. Encounter for follow-up surveillance of breast cancer    Beckey Rutter, [place], AGNP-C [place] at [facility] [phone] (clinic) 10/20/2022

## 2022-10-20 NOTE — Addendum Note (Signed)
Addended by: Luella Cook on: 10/20/2022 03:30 PM   Modules accepted: Orders

## 2022-10-27 DIAGNOSIS — B349 Viral infection, unspecified: Secondary | ICD-10-CM | POA: Diagnosis not present

## 2022-10-27 DIAGNOSIS — R519 Headache, unspecified: Secondary | ICD-10-CM | POA: Diagnosis not present

## 2022-11-16 ENCOUNTER — Other Ambulatory Visit: Payer: Self-pay | Admitting: Family Medicine

## 2022-11-16 ENCOUNTER — Other Ambulatory Visit (INDEPENDENT_AMBULATORY_CARE_PROVIDER_SITE_OTHER): Payer: BC Managed Care – PPO

## 2022-11-16 ENCOUNTER — Ambulatory Visit: Payer: Self-pay | Admitting: *Deleted

## 2022-11-16 DIAGNOSIS — Z13220 Encounter for screening for lipoid disorders: Secondary | ICD-10-CM

## 2022-11-16 DIAGNOSIS — R7303 Prediabetes: Secondary | ICD-10-CM | POA: Diagnosis not present

## 2022-11-16 LAB — LIPID PANEL
Cholesterol: 210 mg/dL — ABNORMAL HIGH (ref 0–200)
HDL: 47.2 mg/dL (ref >39.00)
LDL Cholesterol: 128 mg/dL — ABNORMAL HIGH (ref 0–99)
NonHDL: 162.41
Total CHOL/HDL Ratio: 4
Triglycerides: 173 mg/dL — ABNORMAL HIGH (ref 0.0–149.0)
VLDL: 34.6 mg/dL (ref 0.0–40.0)

## 2022-11-16 LAB — COMPREHENSIVE METABOLIC PANEL
ALT: 12 U/L (ref 0–35)
AST: 14 U/L (ref 0–37)
Albumin: 4.5 g/dL (ref 3.5–5.2)
Alkaline Phosphatase: 45 U/L (ref 39–117)
BUN: 14 mg/dL (ref 6–23)
CO2: 26 mEq/L (ref 19–32)
Calcium: 9.5 mg/dL (ref 8.4–10.5)
Chloride: 103 mEq/L (ref 96–112)
Creatinine, Ser: 0.64 mg/dL (ref 0.40–1.20)
GFR: 102.94 mL/min (ref >60.00)
Glucose, Bld: 89 mg/dL (ref 70–99)
Potassium: 4.5 mEq/L (ref 3.5–5.1)
Sodium: 139 mEq/L (ref 135–145)
Total Bilirubin: 0.3 mg/dL (ref 0.2–1.2)
Total Protein: 7.3 g/dL (ref 6.0–8.3)

## 2022-11-16 LAB — HEMOGLOBIN A1C: Hgb A1c MFr Bld: 6.1 % (ref 4.6–6.5)

## 2022-11-16 NOTE — Patient Instructions (Signed)
Visit Information  Thank you for taking time to visit with me today. Please don't hesitate to contact me if I can be of assistance to you.   Following are the goals we discussed today:   Goals Addressed             This Visit's Progress    Care Management Activities       Care Coordination Interventions: Phone call to patient, care management program discussed/offered Patient verbalized having no nursing or community resource needs at this time Patient encouraged to contact this social worker in the event any needs may arise            Please call the care guide team at [phone] if you need to cancel or reschedule your appointment.   If you are experiencing a Mental Health or [place] or need someone to talk to, please call the Suicide and Crisis Lifeline: 988   Patient verbalizes understanding of instructions and care plan provided today and agrees to view in [place]. Active MyChart status and patient understanding of how to access instructions and care plan via MyChart confirmed with patient.     No further follow up required: patient verbalized having no nursing or community resource needs at this time  Elliot Gurney, [place] Worker  [facility] Care Management [phone]

## 2022-11-16 NOTE — Patient Outreach (Signed)
  Care Coordination   Initial Visit Note   11/16/2022 Name: Melinda Holder MRN: [id number] DOB: 03-Feb-1971  Melinda Holder is a 51 y.o. year old female who sees Caryl Bis, Angela Adam, MD for primary care. I spoke with  Melinda Holder by phone today.  What matters to the patients health and wellness today?  Patient denied having any nursing or community resource needs at this time.    Goals Addressed             This Visit's Progress    Care Management Activities       Care Coordination Interventions: Phone call to patient, care management program discussed/offered Patient verbalized having no nursing or community resource needs at this time Patient encouraged to contact this social worker in the event any needs may arise           SDOH assessments and interventions completed:  No     Care Coordination Interventions Activated:  Yes  Care Coordination Interventions:  Yes, provided   Follow up plan: No further intervention required.   Encounter Outcome:  Pt. Visit Completed

## 2022-11-18 ENCOUNTER — Encounter: Payer: Self-pay | Admitting: Family Medicine

## 2022-11-18 ENCOUNTER — Ambulatory Visit (INDEPENDENT_AMBULATORY_CARE_PROVIDER_SITE_OTHER): Payer: BC Managed Care – PPO

## 2022-11-18 ENCOUNTER — Ambulatory Visit (INDEPENDENT_AMBULATORY_CARE_PROVIDER_SITE_OTHER): Payer: BC Managed Care – PPO | Admitting: Family Medicine

## 2022-11-18 VITALS — BP 114/74 | HR 82 | Temp 98.0°F | Ht 62.5 in | Wt 147.4 lb

## 2022-11-18 DIAGNOSIS — R0789 Other chest pain: Secondary | ICD-10-CM

## 2022-11-18 DIAGNOSIS — Z0001 Encounter for general adult medical examination with abnormal findings: Secondary | ICD-10-CM | POA: Diagnosis not present

## 2022-11-18 DIAGNOSIS — Z23 Encounter for immunization: Secondary | ICD-10-CM | POA: Diagnosis not present

## 2022-11-18 DIAGNOSIS — R079 Chest pain, unspecified: Secondary | ICD-10-CM | POA: Diagnosis not present

## 2022-11-18 NOTE — Progress Notes (Signed)
[name], MD Phone: [phone]  Melinda Holder is a 51 y.o. female who presents today for CPE.  Diet: small portions, not many fried foods or sweets, does have sweet tea daily Exercise: walks 45 minutes 4x/week Pap smear: 09/28/19 NILM neg HPV Colonoscopy: 06/27/18 10 year recall Mammogram: scheduled in january Family history-  Colon cancer: no  Breast cancer: paternal aunts, patient had breast cancer, notes it was not genetic  Ovarian cancer: no Menses: LMP August, having some hot flashes Vaccines-   Flu: due  Tetanus: UTD  Shingles: due  COVID19: x3 HIV screening: UTD Hep C Screening: UTD Tobacco use: no Alcohol use: occasionally has 6-7 beers, though this is rare Illicit Drug use: no Dentist: yes Ophthalmology: yes  Chest pain: Patient notes intermittent shooting discomfort in her upper chest.  Occurs at rest.  There is no exertional component.  No chest pressure.  No radiation, shortness of breath, diaphoresis.  Resolves quickly and rarely occurs.  She can go a month or more with no symptoms.  The 10-year ASCVD risk score (Arnett DK, et al., 2019) is: 3.7%   Values used to calculate the score:     Age: 42 years     Sex: Female     Is Non-Hispanic African American: No     Diabetic: No     Tobacco smoker: Yes     Systolic Blood Pressure: 440 mmHg     Is BP treated: No     HDL Cholesterol: 47.2 mg/dL     Total Cholesterol: 210 mg/dL   Active Ambulatory Problems    Diagnosis Date Noted   Overweight (BMI 25.0-29.9) 09/03/2015   Encounter for general adult medical examination with abnormal findings 02/27/2016   Iron deficiency anemia 05/20/2017   Malignant neoplasm of left female breast ([place]) 11/03/2018   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[place])    Need for prophylactic vaccination and inoculation against influenza 09/20/2019   Chronic headaches 11/14/2020   Breast lesion 12/13/2020   Prediabetes 11/16/2022   Atypical chest pain  11/18/2022   Resolved Ambulatory Problems    Diagnosis Date Noted   Encounter to establish care 09/03/2015   Pruritus 09/03/2015   Need for prophylactic vaccination with combined diphtheria-tetanus-pertussis (DTP) vaccine 09/03/2015   Encounter for immunization 09/03/2015   Need for Tdap vaccination 09/03/2015   Adnexal pain 11/18/2012   Ankle swelling, right 05/20/2017   Constipation 05/20/2017   Enteritis 06/30/2017   Malrotation of intestine    Elevated glucose 06/07/2018   Acute gastritis without hemorrhage    Acute duodenitis    Other specified diseases of intestine    Goals of care, counseling/discussion 01/05/2019   Bruising 11/14/2020   Past Medical History:  Diagnosis Date   Amblyopia of eye, right    Anemia    Asthma    Breast cancer ([place]) 2019   Cancer ([place]) 10/27/2018   Difficult intubation 11/03/2016   Endometriosis    GERD (gastroesophageal reflux disease)    Migraine    Personal history of radiation therapy     Family History  Problem Relation Age of Onset   Lupus Mother    Hypertension Mother    Lymphoma Mother 70       B cell; currently 70   Hypertension Father        currently 36   Diabetes Father    Breast cancer Paternal Aunt 45       deceased 24   Lung cancer Paternal Aunt  80       deceased 26   Brain cancer Paternal Grandfather 67       deceased 47    Social History   Socioeconomic History   Marital status: Married    Spouse name: 0   Number of children: 0   Years of education: Not on file   Highest education level: Associate degree: academic program  Occupational History   Occupation: Librarian, academic    Comment: [employer]  Tobacco Use   Smoking status: Former    Packs/day: 1.00    Years: 15.00    Total pack years: 15.00    Types: Cigarettes    Quit date: 02/21/2015    Years since quitting: 7.7   Smokeless tobacco: Never  Vaping Use   Vaping Use: Former   Quit date: 01/01/2019  Substance and Sexual Activity   Alcohol  use: Yes    Alcohol/week: 2.0 standard drinks of alcohol    Types: 2 Cans of beer per week    Comment: Occasional    Drug use: No   Sexual activity: Yes    Partners: Male    Comment: 1 female  Other Topics Concern   Not on file  Social History Narrative   Works- Engineer, 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 CV: Positive for chest pain, negative for dyspnea, edema, palpitations Resp: Negative for cough, dyspnea, hemoptysis GI: Negative for nausea, vomiting, diarrhea, constipation, abdominal pain, melena, hematochezia. GU: Negative for dysuria, incontinence, urinary hesitance, hematuria, vaginal or penile discharge, polyuria, sexual difficulty, lumps in testicle or breasts MSK: Negative for muscle cramps or aches, joint pain or swelling Neuro: Negative for headaches, weakness, numbness, dizziness, passing out/fainting Psych: Negative for depression, anxiety, memory problems  Objective  Physical Exam Vitals:   11/18/22 0825  BP: 114/74  Pulse: 82  Temp: 98 F (36.7 C)  SpO2: 98%    BP Readings from Last 3 Encounters:  11/18/22 114/74  04/28/22 113/66  04/21/22 125/79   Wt Readings from Last 3 Encounters:  11/18/22 147 lb 6.4 oz (66.9 kg)  04/21/22 155 lb 6.8 oz (70.5 kg)  04/17/22 155 lb 8 oz (70.5 kg)    Physical Exam Constitutional:      General: She is not in  acute distress.    Appearance: She is not diaphoretic.  HENT:     Head: Normocephalic and atraumatic.  Cardiovascular:     Rate and Rhythm: Normal rate and regular rhythm.     Heart sounds: Normal heart sounds.  Pulmonary:     Effort: Pulmonary effort is normal.     Breath sounds: Normal breath sounds.  Chest:     Chest wall: No tenderness (no upper chest tenderness, CMA Benjamine Mola served as chaperone).  Abdominal:     General: Bowel sounds are normal. There is  no distension.     Palpations: Abdomen is soft.     Tenderness: There is no abdominal tenderness.  Musculoskeletal:     Right lower leg: No edema.     Left lower leg: No edema.  Lymphadenopathy:     Cervical: No cervical adenopathy.  Skin:    General: Skin is warm and dry.  Neurological:     Mental Status: She is alert.  Psychiatric:        Mood and Affect: Mood normal.      Assessment/Plan:   Problem List Items Addressed This Visit     Atypical chest pain    Likely musculoskeletal cause.  Description is not consistent with a cardiac cause.  If this starts to occur more frequently she will let us know.  Discussed if she developed chest pressure, shortness of breath, diaphoresis, or any new symptoms with this or persistent symptoms with this she should seek medical attention immediately. CXR to evaluate for underlying lesions that could be contributing.       Relevant Orders   DG Chest 2 View   Encounter for general adult medical examination with abnormal findings - Primary    Physical exam completed.  Encouraged healthy diet and exercise.  Discussed reducing sweet tea intake and minimizing fried food and sugary food intake given her prediabetes.  Discussed risk of developing diabetes when you are prediabetic.  Mammogram is scheduled.  Other cancer screening is up-to-date.  She declines future COVID vaccinations.  I encouraged her to get the Shingrix vaccine.  She will check with her insurance on coverage.  She will be  given a flu vaccine today.  She will let us know if she starts to have menstrual cycles again.  She is likely perimenopausal or going through menopause.  Discussed reduction in alcohol intake when she does drink.  Advise no more than 2-3 drinks at a time or it is binge drinking and there is risk with that.  Lab work reviewed with patient.      Other Visit Diagnoses     Need for immunization against influenza       Relevant Orders   Flu Vaccine QUAD 61moIM (Fluarix, Fluzone & Alfiuria Quad PF) (Completed)       Return in about 1 year (around 11/19/2023) for CPE.   ETommi Rumps MD [place]

## 2022-11-18 NOTE — Patient Instructions (Signed)
Nice to see you. Please increase your exercise and reduce your sweet tea intake and sugary food intake. If you start to have the chest discomfort more frequently please let us know.

## 2022-11-18 NOTE — Assessment & Plan Note (Signed)
Physical exam completed.  Encouraged healthy diet and exercise.  Discussed reducing sweet tea intake and minimizing fried food and sugary food intake given her prediabetes.  Discussed risk of developing diabetes when you are prediabetic.  Mammogram is scheduled.  Other cancer screening is up-to-date.  She declines future COVID vaccinations.  I encouraged her to get the Shingrix vaccine.  She will check with her insurance on coverage.  She will be given a flu vaccine today.  She will let us know if she starts to have menstrual cycles again.  She is likely perimenopausal or going through menopause.  Discussed reduction in alcohol intake when she does drink.  Advise no more than 2-3 drinks at a time or it is binge drinking and there is risk with that.  Lab work reviewed with patient.

## 2022-11-18 NOTE — Assessment & Plan Note (Addendum)
Likely musculoskeletal cause.  Description is not consistent with a cardiac cause.  If this starts to occur more frequently she will let us know.  Discussed if she developed chest pressure, shortness of breath, diaphoresis, or any new symptoms with this or persistent symptoms with this she should seek medical attention immediately. CXR to evaluate for underlying lesions that could be contributing.

## 2022-12-30 DIAGNOSIS — A084 Viral intestinal infection, unspecified: Secondary | ICD-10-CM | POA: Diagnosis not present

## 2023-01-02 ENCOUNTER — Encounter: Payer: Self-pay | Admitting: Emergency Medicine

## 2023-01-02 ENCOUNTER — Ambulatory Visit
Admission: EM | Admit: 2023-01-02 | Discharge: 2023-01-02 | Disposition: A | Discharge status: Home / Self Care | Payer: BLUE CROSS/BLUE SHIELD | Attending: Physician Assistant | Admitting: Physician Assistant

## 2023-01-02 ENCOUNTER — Encounter: Payer: Self-pay | Admitting: Oncology

## 2023-01-02 DIAGNOSIS — R0981 Nasal congestion: Secondary | ICD-10-CM | POA: Diagnosis not present

## 2023-01-02 DIAGNOSIS — R051 Acute cough: Secondary | ICD-10-CM | POA: Insufficient documentation

## 2023-01-02 DIAGNOSIS — J45909 Unspecified asthma, uncomplicated: Secondary | ICD-10-CM | POA: Diagnosis not present

## 2023-01-02 DIAGNOSIS — Z1152 Encounter for screening for COVID-19: Secondary | ICD-10-CM | POA: Insufficient documentation

## 2023-01-02 DIAGNOSIS — Z760 Encounter for issue of repeat prescription: Secondary | ICD-10-CM | POA: Insufficient documentation

## 2023-01-02 DIAGNOSIS — B349 Viral infection, unspecified: Secondary | ICD-10-CM | POA: Insufficient documentation

## 2023-01-02 LAB — SARS CORONAVIRUS 2 BY RT PCR: SARS Coronavirus 2 by RT PCR: NEGATIVE

## 2023-01-02 MED ORDER — PROMETHAZINE-DM 6.25-15 MG/5ML PO SYRP: 5.0000 mL | ORAL_SOLUTION | Freq: Four times a day (QID) | ORAL | 0 refills | Status: DC | PRN

## 2023-01-02 MED ORDER — ALBUTEROL SULFATE HFA 108 (90 BASE) MCG/ACT IN AERS
1.0000 | INHALATION_SPRAY | Freq: Four times a day (QID) | RESPIRATORY_TRACT | 0 refills | Status: DC | PRN
Start: 2023-01-02 — End: 2023-06-07

## 2023-01-02 NOTE — ED Provider Notes (Signed)
[facility]    CSN: [id number] Arrival date & time: 01/02/23  0806      History   Chief Complaint Chief Complaint  Patient presents with   Cough   Nasal Congestion    HPI CAROLIN QUANG is a 52 y.o. female presenting for 2 day history of cough, congestion, fatigue, body aches and headaches. Reports nausea and vomiting 2 days before that which resolved. States cough is nonproductive.  Denies any fever, breathing difficulty or wheezing, vomiting or diarrhea.  History of asthma and is out of inhaler. No sick contacts or known exposure to COVID-19.  Has not tested herself. No other complaints.  HPI  Past Medical History:  Diagnosis Date   Amblyopia of eye, right    Anemia    Asthma    childhood   Breast cancer ([place]) 2019   Cancer ([place]) 10/27/2018   left breast   Difficult intubation 11/03/2016   DL x 2 with MAC 3 (CRNA and MD), very anterior. DL x 2 with Glidescope #3 Lo pro, bougie utilized. Easy mask.   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[place])    Malignant neoplasm of left female breast ([place]) 11/03/2018   Iron deficiency anemia 05/20/2017   Encounter for general adult medical examination with abnormal findings 02/27/2016   Overweight (BMI 25.0-29.9) 09/03/2015    Past Surgical History:  Procedure Laterality Date   ABLATION ON ENDOMETRIOSIS     BREAST BIOPSY Left 10/28/2018   INVASIVE MAMMARY CARCINOMA   BREAST EXCISIONAL BIOPSY Left 10/28/2018   BREAST LUMPECTOMY Left 11/17/2018   INVASIVE MAMMARY CARCINOMA   BREAST LUMPECTOMY Left 12/19/2018   re-excision Procedure:  BREAST LUMPECTOMY;  Surgeon: Jules Husbands, MD;  Location: [facility];  Service: General;  Laterality: Left;   BREAST LUMPECTOMY WITH NEEDLE LOCALIZATION Left 11/17/2018   Procedure: BREAST LUMPECTOMY WITH NEEDLE LOCALIZATION;  Surgeon: Jules Husbands, MD;  Location: [facility];  Service: General;  Laterality: Left;   CLAVICLE SURGERY Left 10/2016   COLONOSCOPY WITH PROPOFOL N/A 06/27/2018   Procedure: COLONOSCOPY WITH PROPOFOL;  Surgeon: Lucilla Lame, MD;  Location: [place];  Service: Endoscopy;  Laterality: N/A;   ESOPHAGOGASTRODUODENOSCOPY (EGD) WITH PROPOFOL N/A 06/27/2018   Procedure: ESOPHAGOGASTRODUODENOSCOPY (EGD) WITH PROPOFOL;  Surgeon: Lucilla Lame, MD;  Location: [place];  Service: Endoscopy;  Laterality: N/A;   EYE MUSCLE SURGERY Right    child   ORIF CLAVICULAR FRACTURE Left 11/03/2016   Procedure: OPEN REDUCTION INTERNAL FIXATION (ORIF) CLAVICULAR FRACTURE;  Surgeon: Corky Mull, MD;  Location: [facility];  Service: Orthopedics;  Laterality: Left;   removal of fibroid     SENTINEL NODE BIOPSY Left 11/17/2018   Procedure: SENTINEL NODE BIOPSY;  Surgeon: Jules Husbands, MD;  Location: [facility];  Service: General;  Laterality: Left;    OB History     Gravida  2   Para  0   Term  0   Preterm  0   AB  2   Living  0      SAB  0   IAB  0  Ectopic  0   Multiple  0   Live Births  0        Obstetric Comments  1st Menstrual Cycle:  14             Home Medications    Prior to Admission medications   Medication Sig Start Date End Date Taking? Authorizing Provider  albuterol (VENTOLIN HFA) 108 (90 Base) MCG/ACT inhaler Inhale 1-2 puffs into the lungs every 6 (six) hours as needed for wheezing or shortness of breath. 01/02/23  Yes Laurene Footman B, PA-C  hydrOXYzine (ATARAX) 10 MG tablet TAKE 1 TABLET BY MOUTH EVERYDAY AT BEDTIME 07/06/22  Yes Leone Haven, MD  promethazine-dextromethorphan (PROMETHAZINE-DM) 6.25-15 MG/5ML syrup Take 5  mLs by mouth 4 (four) times daily as needed. 01/02/23  Yes Laurene Footman B, PA-C  ipratropium (ATROVENT) 0.06 % nasal spray Place 2 sprays into both nostrils 4 (four) times daily. Patient taking differently: Place 2 sprays into both nostrils 4 (four) times daily as needed. 02/02/22   Margarette Canada, NP  ondansetron (ZOFRAN) 4 MG tablet Take 1 tablet (4 mg total) by mouth daily as needed for nausea or vomiting. Patient not taking: Reported on 10/20/2022 04/28/22 04/28/23  Fransico Meadow, PA-C  polyethylene glycol (MIRALAX / GLYCOLAX) 17 g packet Take 17 g by mouth as needed.    [provider]  fluticasone (FLONASE) 50 MCG/ACT nasal spray Place 2 sprays into both nostrils daily. 08/26/20 02/18/21  Melynda Ripple, MD    Family History Family History  Problem Relation Age of Onset   Lupus Mother    Hypertension Mother    Lymphoma Mother 46       B cell; currently 64   Hypertension Father        currently 7   Diabetes Father    Breast cancer Paternal Aunt 3       deceased 56   Lung cancer Paternal Aunt 33       deceased 33   Brain cancer Paternal Grandfather 75       deceased 25    Social History Social History   Tobacco Use   Smoking status: Former    Packs/day: 1.00    Years: 15.00    Total pack years: 15.00    Types: Cigarettes    Quit date: 02/21/2015    Years since quitting: 7.8   Smokeless tobacco: Never  Vaping Use   Vaping Use: Former   Quit date: 01/01/2019  Substance Use Topics   Alcohol use: Yes    Alcohol/week: 2.0 standard drinks of alcohol    Types: 2 Cans of beer per week    Comment: Occasional    Drug use: No     Allergies   Patient has no known allergies.   Review of Systems Review of Systems  Constitutional:  Positive for fatigue. Negative for chills, diaphoresis and fever.  HENT:  Positive for congestion, rhinorrhea and sore throat. Negative for ear pain, sinus pressure and sinus pain.   Respiratory:  Positive for cough. Negative for shortness  of breath.   Gastrointestinal:  Negative for abdominal pain, nausea and vomiting.  Musculoskeletal:  Positive for myalgias.  Skin:  Negative for rash.  Neurological:  Positive for headaches. Negative for weakness.  Hematological:  Negative for adenopathy.     Physical Exam Triage Vital Signs ED Triage Vitals  Enc Vitals Group     BP 04/21/22 0914 125/79     Pulse Rate 04/21/22 0914 84  Resp 04/21/22 0914 16     Temp 04/21/22 0914 97.9 F (36.6 C)     Temp Source 04/21/22 0914 Oral     SpO2 04/21/22 0914 98 %     Weight 04/21/22 0913 155 lb 6.8 oz (70.5 kg)     Height 04/21/22 0913 5' 2.5" (1.588 m)     Head Circumference --      Peak Flow --      Pain Score 04/21/22 0912 0     Pain Loc --      Pain Edu? --      Excl. in [place]? --    No data found.  Updated Vital Signs BP (!) 150/93 (BP Location: Right Arm)   Pulse 90   Temp 97.9 F (36.6 C) (Oral)   Resp 14   Ht 5' 2.5" (1.588 m)   Wt 150 lb (68 kg)   LMP 12/13/2020 (Exact Date) Comment: missed past 2 months   SpO2 96%   BMI 27.00 kg/m        Physical Exam Vitals and nursing note reviewed.  Constitutional:      General: She is not in acute distress.    Appearance: Normal appearance. She is not ill-appearing or toxic-appearing.  HENT:     Head: Normocephalic and atraumatic.     Right Ear: Tympanic membrane, ear canal and external ear normal.     Left Ear: Tympanic membrane, ear canal and external ear normal.     Nose: Congestion present.     Mouth/Throat:     Mouth: Mucous membranes are moist.     Pharynx: Oropharynx is clear. No posterior oropharyngeal erythema.  Eyes:     General: No scleral icterus.       Right eye: No discharge.        Left eye: No discharge.     Conjunctiva/sclera: Conjunctivae normal.  Cardiovascular:     Rate and Rhythm: Normal rate and regular rhythm.     Heart sounds: Normal heart sounds.  Pulmonary:     Effort: Pulmonary effort is normal. No respiratory distress.     Breath  sounds: Normal breath sounds. No wheezing, rhonchi or rales.  Musculoskeletal:     Cervical back: Neck supple.  Skin:    General: Skin is dry.  Neurological:     General: No focal deficit present.     Mental Status: She is alert. Mental status is at baseline.     Motor: No weakness.     Gait: Gait normal.  Psychiatric:        Mood and Affect: Mood normal.        Behavior: Behavior normal.        Thought Content: Thought content normal.      UC Treatments / Results  Labs (all labs ordered are listed, but only abnormal results are displayed) Labs Reviewed  SARS CORONAVIRUS 2 BY RT PCR    EKG   Radiology No results found.  Procedures Procedures (including critical care time)  Medications Ordered in UC Medications - No data to display  Initial Impression / Assessment and Plan / UC Course  I have reviewed the triage vital signs and the nursing notes.  Pertinent labs & imaging results that were available during my care of the patient were reviewed by me and considered in my medical decision making (see chart for details).  52 year old female presenting for 2 day history of nasal congestion and cough.  History of allergies and asthma.  Has  been taking over-the-counter medications.  Vitals are normal and stable and patient is overall well-appearing.  She has minimal nasal congestion on exam.  Chest clear to auscultation heart regular rate and rhythm.    PCR COVID test obtained. Advised that I would contact her on MyChart with results.   Discussed with patient that her symptoms are consistent with a viral URI.  Supportive care encouraged with increasing rest and fluids.   Sent promethazine DM.  Advised to continue the nasal sprays.  Refilled albuterol inhaler. Reviewed return and ED precautions.  Negative COVID test.  Final Clinical Impressions(s) / UC Diagnoses   Final diagnoses:  Viral illness  Acute cough  Nasal congestion  Encounter for screening for COVID-19   Asthma, unspecified asthma severity, unspecified whether complicated, unspecified whether persistent  Medication refill     Discharge Instructions      URI/COLD SYMPTOMS: Your exam today is consistent with a viral illness. Antibiotics are not indicated at this time. Use medications as directed, including cough syrup, nasal saline, and decongestants. Your symptoms should improve over the next few days and resolve within 7-14 days. Increase rest and fluids. F/u if symptoms worsen or predominate such as sore throat, ear pain, productive cough, shortness of breath, or if you develop high fevers or worsening fatigue over the next several days.    Do through MyChart with your COVID result.  If you are positive you need to isolate 5 days from symptom onset and wear mask x 5 days.  If negative, you likely another viral illness which can last for a couple weeks sometimes.  If you have a fever or any breathing difficulty you should be seen again.  I did refill your inhaler and sent cough medicine for you.       ED Prescriptions     Medication Sig Dispense Auth. Provider   promethazine-dextromethorphan (PROMETHAZINE-DM) 6.25-15 MG/5ML syrup Take 5 mLs by mouth 4 (four) times daily as needed. 118 mL Laurene Footman B, PA-C   albuterol (VENTOLIN HFA) 108 (90 Base) MCG/ACT inhaler Inhale 1-2 puffs into the lungs every 6 (six) hours as needed for wheezing or shortness of breath. 1 g Danton Clap, PA-C      PDMP not reviewed this encounter.    Danton Clap, PA-C 01/02/23 [phone]

## 2023-01-02 NOTE — ED Triage Notes (Signed)
Patient c/o cough, runny nose, bodyaches and headache that started on Thursday.  Patient denies fevers.

## 2023-01-02 NOTE — Discharge Instructions (Addendum)
URI/COLD SYMPTOMS: Your exam today is consistent with a viral illness. Antibiotics are not indicated at this time. Use medications as directed, including cough syrup, nasal saline, and decongestants. Your symptoms should improve over the next few days and resolve within 7-14 days. Increase rest and fluids. F/u if symptoms worsen or predominate such as sore throat, ear pain, productive cough, shortness of breath, or if you develop high fevers or worsening fatigue over the next several days.    Do through MyChart with your COVID result.  If you are positive you need to isolate 5 days from symptom onset and wear mask x 5 days.  If negative, you likely another viral illness which can last for a couple weeks sometimes.  If you have a fever or any breathing difficulty you should be seen again.  I did refill your inhaler and sent cough medicine for you.

## 2023-01-06 ENCOUNTER — Ambulatory Visit
Admission: RE | Admit: 2023-01-06 | Discharge: 2023-01-06 | Disposition: A | Discharge status: Home / Self Care | Payer: BLUE CROSS/BLUE SHIELD | Source: Ambulatory Visit | Attending: Oncology | Admitting: Oncology

## 2023-01-06 DIAGNOSIS — Z1231 Encounter for screening mammogram for malignant neoplasm of breast: Secondary | ICD-10-CM | POA: Insufficient documentation

## 2023-01-06 DIAGNOSIS — Z853 Personal history of malignant neoplasm of breast: Secondary | ICD-10-CM | POA: Diagnosis not present

## 2023-01-06 DIAGNOSIS — Z08 Encounter for follow-up examination after completed treatment for malignant neoplasm: Secondary | ICD-10-CM | POA: Diagnosis not present

## 2023-05-06 DIAGNOSIS — J019 Acute sinusitis, unspecified: Secondary | ICD-10-CM | POA: Diagnosis not present

## 2023-05-06 DIAGNOSIS — R051 Acute cough: Secondary | ICD-10-CM | POA: Diagnosis not present

## 2023-05-06 DIAGNOSIS — Z03818 Encounter for observation for suspected exposure to other biological agents ruled out: Secondary | ICD-10-CM | POA: Diagnosis not present

## 2023-06-07 ENCOUNTER — Ambulatory Visit
Admission: EM | Admit: 2023-06-07 | Discharge: 2023-06-07 | Disposition: A | Discharge status: Home / Self Care | Payer: BC Managed Care – PPO | Attending: Emergency Medicine | Admitting: Emergency Medicine

## 2023-06-07 ENCOUNTER — Encounter: Payer: Self-pay | Admitting: Oncology

## 2023-06-07 DIAGNOSIS — G43009 Migraine without aura, not intractable, without status migrainosus: Secondary | ICD-10-CM

## 2023-06-07 MED ORDER — ONDANSETRON 8 MG PO TBDP: 8.0000 mg | ORAL_TABLET | Freq: Three times a day (TID) | ORAL | 0 refills | Status: DC | PRN

## 2023-06-07 MED ORDER — DEXAMETHASONE SODIUM PHOSPHATE 10 MG/ML IJ SOLN
10.0000 mg | Freq: Once | INTRAMUSCULAR | Status: AC
  Administered 2023-06-07: 10 mg via INTRAMUSCULAR

## 2023-06-07 MED ORDER — ONDANSETRON 8 MG PO TBDP
8.0000 mg | ORAL_TABLET | Freq: Once | ORAL | Status: AC
  Administered 2023-06-07: 8 mg via ORAL

## 2023-06-07 MED ORDER — KETOROLAC TROMETHAMINE 30 MG/ML IJ SOLN
30.0000 mg | Freq: Once | INTRAMUSCULAR | Status: AC
  Administered 2023-06-07: 30 mg via INTRAMUSCULAR

## 2023-06-07 NOTE — Discharge Instructions (Addendum)
Go home and rest in a cool dark environment.  Use over-the-counter ibuprofen or Aleve as needed for mild to moderate pain.  Use the Zofran every 8 hours as needed for nausea and vomiting.  If your headache does not resolve, or worsens, please go to the ER for evaluation.

## 2023-06-07 NOTE — ED Provider Notes (Signed)
[facility]    CSN: [id number] Arrival date & time: 06/07/23  0843      History   Chief Complaint Chief Complaint  Patient presents with   Headache   Blurred Vision    HPI Melinda Holder is a 52 y.o. female.   HPI  52 year old female with a past medical history of IDA, breast cancer, prediabetes, and migraines presents for evaluation of 2 days worth of headache, blurred vision, and light sensitivity.  She reports that this is a typical pattern of headache for her as it occurs across the frontal region and it is throbbing in nature.  She denies any fever, vomiting, runny nose, or nasal congestion.  Not on any medications for migraines at home and states that typically when she gets a headache she either comes to this urgent care or the [facility] urgent care for 2 injections and some nausea medication.  She is followed by [facility] health neurology and she had an MRI of her brain that was negative back in 2021.  No imaging since.  Past Medical History:  Diagnosis Date   Amblyopia of eye, right    Anemia    Asthma    childhood   Breast cancer (HCC) 2019   Cancer (HCC) 10/27/2018   left breast   Difficult intubation 11/03/2016   DL x 2 with MAC 3 (CRNA and MD), very anterior. DL x 2 with Glidescope #3 Lo pro, bougie utilized. Easy mask.   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HCC)    Malignant neoplasm of left female breast (HCC) 11/03/2018   Iron deficiency anemia 05/20/2017   Encounter for general adult medical examination with abnormal findings 02/27/2016   Overweight (BMI 25.0-29.9)  09/03/2015    Past Surgical History:  Procedure Laterality Date   ABLATION ON ENDOMETRIOSIS     BREAST BIOPSY Left 10/28/2018   INVASIVE MAMMARY CARCINOMA   BREAST EXCISIONAL BIOPSY Left 10/28/2018   BREAST LUMPECTOMY Left 11/17/2018   INVASIVE MAMMARY CARCINOMA   BREAST LUMPECTOMY Left 12/19/2018   re-excision Procedure: BREAST LUMPECTOMY;  Surgeon: Leafy Ro, MD;  Location: [facility] OR[facility];  Service: General;  Laterality: Left;   BREAST LUMPECTOMY WITH NEEDLE LOCALIZATION Left 11/17/2018   Procedure: BREAST LUMPECTOMY WITH NEEDLE LOCALIZATION;  Surgeon: Leafy Ro, MD;  Location: [facility];  Service: General;  Laterality: Left;   CLAVICLE SURGERY Left 10/2016   COLONOSCOPY WITH PROPOFOL N/A 06/27/2018   Procedure: COLONOSCOPY WITH PROPOFOL;  Surgeon: Midge Minium, MD;  Location: [facility];  Service: Endoscopy;  Laterality: N/A;   ESOPHAGOGASTRODUODENOSCOPY (EGD) WITH PROPOFOL N/A 06/27/2018   Procedure: ESOPHAGOGASTRODUODENOSCOPY (EGD) WITH PROPOFOL;  Surgeon: Midge Minium, MD;  Location: [facility];  Service: Endoscopy;  Laterality: N/A;   EYE MUSCLE SURGERY Right    child   ORIF CLAVICULAR FRACTURE Left 11/03/2016   Procedure: OPEN REDUCTION INTERNAL FIXATION (ORIF) CLAVICULAR FRACTURE;  Surgeon: Christena Flake, MD;  Location: [facility];  Service: Orthopedics;  Laterality: Left;   removal of fibroid     SENTINEL NODE BIOPSY Left 11/17/2018   Procedure: SENTINEL NODE  BIOPSY;  Surgeon: Leafy Ro, MD;  Location: [facility];  Service: General;  Laterality: Left;    OB History     Gravida  2   Para  0   Term  0   Preterm  0   AB  2   Living  0      SAB  0   IAB  0   Ectopic  0   Multiple  0   Live Births  0        Obstetric Comments  1st Menstrual Cycle:  14             Home Medications    Prior to Admission medications   Medication Sig Start Date End Date Taking? Authorizing Provider  hydrOXYzine (ATARAX) 10 MG tablet TAKE 1 TABLET  BY MOUTH EVERYDAY AT BEDTIME 07/06/22  Yes [name], MD  ipratropium (ATROVENT) 0.06 % nasal spray Place 2 sprays into both nostrils 4 (four) times daily. Patient taking differently: Place 2 sprays into both nostrils 4 (four) times daily as needed. 02/02/22  Yes Becky Augusta, NP  ondansetron (ZOFRAN-ODT) 8 MG disintegrating tablet Take 1 tablet (8 mg total) by mouth every 8 (eight) hours as needed for nausea or vomiting. 06/07/23  Yes Becky Augusta, NP  polyethylene glycol (MIRALAX / GLYCOLAX) 17 g packet Take 17 g by mouth as needed.   Yes [provider]  fluticasone (FLONASE) 50 MCG/ACT nasal spray Place 2 sprays into both nostrils daily. 08/26/20 02/18/21  Domenick Gong, MD    Family History Family History  Problem Relation Age of Onset   Lupus Mother    Hypertension Mother    Lymphoma Mother 38       B cell; currently 70   Hypertension Father        currently 13   Diabetes Father    Breast cancer Paternal Aunt 63       deceased 77   Lung cancer Paternal Aunt 47       deceased 43   Brain cancer Paternal Grandfather 56       deceased 72    Social History Social History   Tobacco Use   Smoking status: Former    Packs/day: 1.00    Years: 15.00    Additional pack years: 0.00    Total pack years: 15.00    Types: Cigarettes    Quit date: 02/21/2015    Years since quitting: 8.2   Smokeless tobacco: Never  Vaping Use   Vaping Use: Former   Quit date: 01/01/2019  Substance Use Topics   Alcohol use: Yes    Alcohol/week: 2.0 standard drinks of alcohol    Types: 2 Cans of beer per week    Comment: Occasional    Drug use: No     Allergies   Patient has no known allergies.   Review of Systems Review of Systems  Constitutional:  Negative for fever.  Eyes:  Positive for photophobia and visual disturbance.  Gastrointestinal:  Positive for nausea. Negative for vomiting.  Neurological:  Positive for headaches. Negative for dizziness.     Physical  Exam Triage Vital Signs ED Triage Vitals  Enc Vitals Group     BP 06/07/23 0915 119/79     Pulse Rate 06/07/23 0915 75     Resp 06/07/23 0915 16     Temp 06/07/23 0915 98.3 F (36.8 C)     Temp Source 06/07/23 0915 Oral     SpO2 06/07/23  0915 94 %     Weight 06/07/23 0914 148 lb (67.1 kg)     Height 06/07/23 0914 5\' 2"  (1.575 m)     Head Circumference --      Peak Flow --      Pain Score 06/07/23 0928 9     Pain Loc --      Pain Edu? --      Excl. in GC? --    No data found.  Updated Vital Signs BP 119/79 (BP Location: Left Arm)   Pulse 75   Temp 98.3 F (36.8 C) (Oral)   Resp 16   Ht 5\' 2"  (1.575 m)   Wt 148 lb (67.1 kg)   LMP 12/13/2020 (Exact Date) Comment: missed past 2 months   SpO2 94%   BMI 27.07 kg/m   Visual Acuity Right Eye Distance:   Left Eye Distance:   Bilateral Distance:    Right Eye Near:   Left Eye Near:    Bilateral Near:     Physical Exam Vitals and nursing note reviewed.  Constitutional:      Appearance: She is well-developed. She is not ill-appearing.  HENT:     Head: Normocephalic and atraumatic.     Mouth/Throat:     Mouth: Mucous membranes are moist.     Pharynx: Oropharynx is clear.  Eyes:     General: No scleral icterus.    Extraocular Movements: Extraocular movements intact.     Pupils: Pupils are equal, round, and reactive to light.  Cardiovascular:     Rate and Rhythm: Normal rate and regular rhythm.     Heart sounds: Normal heart sounds. No murmur heard.    No friction rub. No gallop.  Pulmonary:     Effort: Pulmonary effort is normal.     Breath sounds: Normal breath sounds. No wheezing, rhonchi or rales.  Neurological:     Mental Status: She is alert and oriented to person, place, and time.     GCS: GCS eye subscore is 4. GCS verbal subscore is 5. GCS motor subscore is 6.     Cranial Nerves: No cranial nerve deficit, dysarthria or facial asymmetry.     Sensory: No sensory deficit.     Motor: No weakness.      Coordination: Coordination normal.  Psychiatric:        Mood and Affect: Mood normal.        Speech: Speech normal.        Behavior: Behavior normal.      UC Treatments / Results  Labs (all labs ordered are listed, but only abnormal results are displayed) Labs Reviewed - No data to display  EKG   Radiology No results found.  Procedures Procedures (including critical care time)  Medications Ordered in UC Medications  ketorolac (TORADOL) 30 MG/ML injection 30 mg (has no administration in time range)  dexamethasone (DECADRON) injection 10 mg (has no administration in time range)  ondansetron (ZOFRAN-ODT) disintegrating tablet 8 mg (has no administration in time range)    Initial Impression / Assessment and Plan / UC Course  I have reviewed the triage vital signs and the nursing notes.  Pertinent labs & imaging results that were available during my care of the patient were reviewed by me and considered in my medical decision making (see chart for details).   Patient is a pleasant, nontoxic-appearing 52 year old female presenting for evaluation of migraine headache which is her typical pattern being frontal and throbbing in nature.  It  has been off and on for the past 2 days and has not responded to over-the-counter Excedrin.  She is typically treated with IM Toradol, IM Decadron, and ODT Zofran at the urgent care with her solution of her symptoms.  I will order 30 of IM Toradol, 10 of IM Decadron, and 8 WTE of sublingual Zofran and discharge the patient home.  I have advised her that if her symptoms do not improve, or they worsen, that she needs to be evaluated in the ER.  Work note provided.   Final Clinical Impressions(s) / UC Diagnoses   Final diagnoses:  Migraine without aura and without status migrainosus, not intractable     Discharge Instructions      Go home and rest in a cool dark environment.  Use over-the-counter ibuprofen or Aleve as needed for mild to  moderate pain.  Use the Zofran every 8 hours as needed for nausea and vomiting.  If your headache does not resolve, or worsens, please go to the ER for evaluation.      ED Prescriptions     Medication Sig Dispense Auth. Provider   ondansetron (ZOFRAN-ODT) 8 MG disintegrating tablet Take 1 tablet (8 mg total) by mouth every 8 (eight) hours as needed for nausea or vomiting. 20 tablet Becky Augusta, NP      PDMP not reviewed this encounter.   Becky Augusta, NP 06/07/23 1004

## 2023-06-07 NOTE — ED Triage Notes (Signed)
Pt c/o HA,blurred vision & light sensitivity. x2 days. Hx of migraines. Has tried Excedrin last night w/o relief.

## 2023-06-08 ENCOUNTER — Ambulatory Visit: Payer: BLUE CROSS/BLUE SHIELD | Admitting: Family Medicine

## 2023-06-17 ENCOUNTER — Ambulatory Visit: Payer: BLUE CROSS/BLUE SHIELD | Admitting: Family Medicine

## 2023-07-13 ENCOUNTER — Other Ambulatory Visit: Payer: Self-pay | Admitting: Family Medicine

## 2023-08-02 ENCOUNTER — Encounter: Payer: Self-pay | Admitting: Family Medicine

## 2023-08-02 ENCOUNTER — Ambulatory Visit: Payer: BC Managed Care – PPO | Admitting: Family Medicine

## 2023-08-02 VITALS — BP 114/74 | HR 93 | Temp 98.0°F | Ht 62.0 in | Wt 146.0 lb

## 2023-08-02 DIAGNOSIS — R7989 Other specified abnormal findings of blood chemistry: Secondary | ICD-10-CM | POA: Diagnosis not present

## 2023-08-02 DIAGNOSIS — K921 Melena: Secondary | ICD-10-CM

## 2023-08-02 DIAGNOSIS — R3129 Other microscopic hematuria: Secondary | ICD-10-CM | POA: Diagnosis not present

## 2023-08-02 DIAGNOSIS — N951 Menopausal and female climacteric states: Secondary | ICD-10-CM

## 2023-08-02 DIAGNOSIS — R0789 Other chest pain: Secondary | ICD-10-CM

## 2023-08-02 LAB — URINALYSIS, MICROSCOPIC ONLY

## 2023-08-02 NOTE — Assessment & Plan Note (Signed)
-   Referred to GI for evaluation.

## 2023-08-02 NOTE — Assessment & Plan Note (Addendum)
Refer to urology for evaluation.  Recheck urinalysis today.

## 2023-08-02 NOTE — Assessment & Plan Note (Signed)
Recheck today. 

## 2023-08-02 NOTE — Assessment & Plan Note (Signed)
Possibly related to reflux versus a musculoskeletal issue.  Discussed referral to cardiology for evaluation that we jointly opted to hold off on that given that this was a one-time occurrence if she develops recurrent discomfort in her chest she will let us know and we can refer her.  If she develops persistent discomfort she will seek medical attention.

## 2023-08-02 NOTE — Progress Notes (Unsigned)
Marikay Alar, MD Phone: [phone]  Franca Damour Ingargiola is a 52 y.o. female who presents today for follow-up.   Patient notes she is here for Pap smear.  I discussed that her last Pap smear was 09/28/2019 with NILM and negative HPV.  Discussed current guidelines indicate Pap smears every 5 years when Pap smear with HPV testing is negative.  Discussed patient is not due for Pap smear.    Postmenopausal: She does note some postmenopausal symptoms.  She notes it has been over a year since she has had a menstrual cycle.  She is been having hot flashes 4-5 times every other day.  She notes it is not bothersome.  Rectal bleeding/hematuria: Patient notes 1 day last week she had fresh blood in the toilet.  She was also noted to have microscopic hematuria during an ED visit recently.  Chest pain: Patient noted an episode of chest pain that took her to the emergency department in June.  She notes that eased off and went away on its own.  She noted some shortness of breath with this.  She describes it as a significant chest pain though does not describe it as a pressure.  She notes no recurrence.  Social History   Tobacco Use  Smoking Status Former   Current packs/day: 0.00   Average packs/day: 1 pack/day for 15.0 years (15.0 ttl pk-yrs)   Types: Cigarettes   Start date: 02/22/2000   Quit date: 02/21/2015   Years since quitting: 8.4  Smokeless Tobacco Never    Current Outpatient Medications on File Prior to Visit  Medication Sig Dispense Refill   hydrOXYzine (ATARAX) 10 MG tablet TAKE 1 TABLET BY MOUTH EVERYDAY AT BEDTIME 30 tablet 7   ipratropium (ATROVENT) 0.06 % nasal spray Place 2 sprays into both nostrils 4 (four) times daily. (Patient taking differently: Place 2 sprays into both nostrils 4 (four) times daily as needed.) 15 mL 12   ondansetron (ZOFRAN-ODT) 8 MG disintegrating tablet Take 1 tablet (8 mg total) by mouth every 8 (eight) hours as needed for nausea or vomiting. 20 tablet 0    polyethylene glycol (MIRALAX / GLYCOLAX) 17 g packet Take 17 g by mouth as needed.     [DISCONTINUED] fluticasone (FLONASE) 50 MCG/ACT nasal spray Place 2 sprays into both nostrils daily. 16 g 0   No current facility-administered medications on file prior to visit.     ROS see history of present illness  Objective  Physical Exam Vitals:   08/02/23 1343  BP: 114/74  Pulse: 93  Temp: 98 F (36.7 C)  SpO2: 98%    BP Readings from Last 3 Encounters:  08/02/23 114/74  06/07/23 119/79  01/02/23 (!) 150/93   Wt Readings from Last 3 Encounters:  08/02/23 146 lb (66.2 kg)  06/07/23 148 lb (67.1 kg)  01/02/23 150 lb (68 kg)    Physical Exam Constitutional:      General: She is not in acute distress. Cardiovascular:     Rate and Rhythm: Normal rate and regular rhythm.  Neurological:     Mental Status: She is alert.      Assessment/Plan: Please see individual problem list.  Hot flashes due to menopause Assessment & Plan: Mild in nature.  Patient declines medication for this.  She will monitor and if worsens she will let us know.  Discussed Pap smear would be due next year.   LFTs abnormal Assessment & Plan: Recheck today.  Orders: -     Hepatic function panel  Microscopic hematuria Assessment & Plan: Refer to urology for evaluation.  Recheck urinalysis today.  Orders: -     POCT urinalysis dipstick -     Urine Microscopic -     Ambulatory referral to Urology  Hematochezia Assessment & Plan: Referred to GI for evaluation.  Orders: -     Ambulatory referral to Gastroenterology  Atypical chest pain Assessment & Plan: Possibly related to reflux versus a musculoskeletal issue.  Discussed referral to cardiology for evaluation that we jointly opted to hold off on that given that this was a one-time occurrence if she develops recurrent discomfort in her chest she will let us know and we can refer her.  If she develops persistent discomfort she will seek medical  attention.      Return for As scheduled.  I have spent 31 minutes in the care of this patient regarding history taken, documentation, completion of exam, discussion of plan, placing orders, review of labs from recent ED visit.   Marikay Alar, MD [facility]

## 2023-08-02 NOTE — Assessment & Plan Note (Signed)
Mild in nature.  Patient declines medication for this.  She will monitor and if worsens she will let us know.  Discussed Pap smear would be due next year.

## 2023-08-03 ENCOUNTER — Encounter: Payer: Self-pay | Admitting: Family Medicine

## 2023-08-06 ENCOUNTER — Other Ambulatory Visit: Payer: Self-pay | Admitting: Family Medicine

## 2023-08-06 DIAGNOSIS — N390 Urinary tract infection, site not specified: Secondary | ICD-10-CM | POA: Diagnosis not present

## 2023-08-06 DIAGNOSIS — R350 Frequency of micturition: Secondary | ICD-10-CM | POA: Diagnosis not present

## 2023-08-12 ENCOUNTER — Other Ambulatory Visit: Payer: Self-pay | Admitting: Family Medicine

## 2023-08-12 DIAGNOSIS — R3129 Other microscopic hematuria: Secondary | ICD-10-CM

## 2023-09-01 ENCOUNTER — Ambulatory Visit (INDEPENDENT_AMBULATORY_CARE_PROVIDER_SITE_OTHER): Payer: BC Managed Care – PPO | Admitting: Urology

## 2023-09-01 VITALS — BP 148/82 | HR 92 | Ht 62.0 in | Wt 146.0 lb

## 2023-09-01 DIAGNOSIS — R3129 Other microscopic hematuria: Secondary | ICD-10-CM

## 2023-09-01 DIAGNOSIS — Z87442 Personal history of urinary calculi: Secondary | ICD-10-CM | POA: Diagnosis not present

## 2023-09-01 NOTE — Patient Instructions (Signed)

## 2023-09-01 NOTE — Progress Notes (Signed)
Melinda Holder,acting as a scribe for Melinda Scotland, MD.,have documented all relevant documentation on the behalf of Melinda Scotland, MD,as directed by  Melinda Scotland, MD while in the presence of Melinda Scotland, MD.  09/01/2023 3:21 PM   Silvestre Melinda Holder 12-18-1971 [id number]  Referring provider: [name], MD [address] [place],  [place] [address]  Chief Complaint  Patient presents with   New Patient (Initial Visit)   Hematuria    HPI: 52 year-old female who is referred for further evaluation of microscopic hematuria.   She had an episode of fresh blood in the toilet, felt to be related to rectal bleeding and at the time, she did have a urinalysis checked. She did have some microscopic blood in her urine. Her urinalysis was rechecked by her PCP at her follow up last month, and she was known to have persistent 3-6 RBC per high powered field. Otherwise, no blood. It is a relatively new finding. Historical urinalysis did not have have blood.   Her last cross-sectional imaging was in the form of a CT abdomen pelvis with contrast that showed no GU pathology other than an incidental 7 mm non-obstructing left kidney stone.   She does have a personal history of breast cancer but not any aromatase inhibitor at this time.     She has a 15 year smoking history and smoked about a 1-1.5 packs a week. She states that she quit 8 year ago.    PMH: Past Medical History:  Diagnosis Date   Amblyopia of eye, right    Anemia    Asthma    childhood   Breast cancer (HCC) 2019   Cancer (HCC) 10/27/2018   left breast   Difficult intubation 11/03/2016   DL x 2 with MAC 3 (CRNA and MD), very anterior. DL x 2 with Glidescope #3 Lo pro, bougie utilized. Easy mask.   Endometriosis    GERD (gastroesophageal reflux disease)    Migraine    Personal history of radiation therapy     Surgical History: Past Surgical History:  Procedure Laterality Date   ABLATION ON  ENDOMETRIOSIS     BREAST BIOPSY Left 10/28/2018   INVASIVE MAMMARY CARCINOMA   BREAST EXCISIONAL BIOPSY Left 10/28/2018   BREAST LUMPECTOMY Left 11/17/2018   INVASIVE MAMMARY CARCINOMA   BREAST LUMPECTOMY Left 12/19/2018   re-excision Procedure: BREAST LUMPECTOMY;  Surgeon: Leafy Ro, MD;  Location: [facility];  Service: General;  Laterality: Left;   BREAST LUMPECTOMY WITH NEEDLE LOCALIZATION Left 11/17/2018   Procedure: BREAST LUMPECTOMY WITH NEEDLE LOCALIZATION;  Surgeon: Leafy Ro, MD;  Location: [facility];  Service: General;  Laterality: Left;   CLAVICLE SURGERY Left 10/2016   COLONOSCOPY WITH PROPOFOL N/A 06/27/2018   Procedure: COLONOSCOPY WITH PROPOFOL;  Surgeon: Midge Minium, MD;  Location: [facility];  Service: Endoscopy;  Laterality: N/A;   ESOPHAGOGASTRODUODENOSCOPY (EGD) WITH PROPOFOL N/A 06/27/2018   Procedure: ESOPHAGOGASTRODUODENOSCOPY (EGD) WITH PROPOFOL;  Surgeon: Midge Minium, MD;  Location: [facility];  Service: Endoscopy;  Laterality: N/A;   EYE MUSCLE SURGERY Right    child   ORIF CLAVICULAR FRACTURE Left 11/03/2016   Procedure: OPEN REDUCTION INTERNAL FIXATION (ORIF) CLAVICULAR FRACTURE;  Surgeon: Christena Flake, MD;  Location: [facility];  Service: Orthopedics;  Laterality: Left;   removal of fibroid     SENTINEL NODE BIOPSY Left 11/17/2018   Procedure: SENTINEL NODE BIOPSY;  Surgeon: Leafy Ro, MD;  Location: [facility];  Service: General;  Laterality: Left;    Home Medications:  Allergies as of 09/01/2023   No Known Allergies      Medication List        Accurate as of September 01, 2023  3:21 PM. If you have any questions, ask your nurse or doctor.          hydrOXYzine 10 MG tablet Commonly known as: ATARAX TAKE 1 TABLET BY MOUTH EVERYDAY AT BEDTIME   ipratropium 0.06 % nasal spray Commonly known as: ATROVENT Place 2 sprays into both nostrils 4 (four) times daily. What changed:  when to take this reasons to take this    ondansetron 8 MG disintegrating tablet Commonly known as: ZOFRAN-ODT Take 1 tablet (8 mg total) by mouth every 8 (eight) hours as needed for nausea or vomiting.   polyethylene glycol 17 g packet Commonly known as: MIRALAX / GLYCOLAX Take 17 g by mouth as needed.        Family History: Family History  Problem Relation Age of Onset   Lupus Mother    Hypertension Mother    Lymphoma Mother 9       B cell; currently 45   Hypertension Father        currently 5   Diabetes Father    Breast cancer Paternal Aunt 16       deceased 71   Lung cancer Paternal Aunt 29       deceased 35   Brain cancer Paternal Grandfather 42       deceased 60    Social History:  reports that she quit smoking about 8 years ago. Her smoking use included cigarettes. She started smoking about 23 years ago. She has a 15 pack-year smoking history. She has never used smokeless tobacco. She reports current alcohol use of about 2.0 standard drinks of alcohol per week. She reports that she does not use drugs.   Physical Exam: BP (!) 148/82   Pulse 92   Ht 5\' 2"  (1.575 m)   Wt 146 lb (66.2 kg)   LMP 12/13/2020 (Exact Date) Comment: missed past 2 months   BMI 26.70 kg/m   Constitutional:  Alert and oriented, No acute distress. HEENT: [place] AT, moist mucus membranes.  Trachea midline, no masses. Neurologic: Grossly intact, no focal deficits, moving all 4 extremities. Psychiatric: Normal mood and affect.   Assessment & Plan:    1. Microscopic hematuria - We discussed the differential diagnosis for microscopic hematuria including nephrolithiasis, renal or upper tract tumors, bladder stones, UTIs, or bladder tumors as well as undetermined etiologies.Per AUA guidelines, I did recommend microscopic hematuria evaluation including renal ultrasound and cystoscopy. - She is low risk based on her age and degree of hematuria  2. History of kidney stone - Seen on historical imaging - She is asymptomatic - Possibly  the cause of her microscopic blood - We will evaluate with renal ultrasound - Her urinalysis today is negative other than >10 squam  Return for renal ultrasound and cystoscopy.  I have reviewed the above documentation for accuracy and completeness, and I agree with the above.   Melinda Scotland, MD   [facility] [address], [address] [place], [place] [address] [phone]

## 2023-09-02 LAB — URINALYSIS, COMPLETE
Bilirubin, UA: NEGATIVE
Glucose, UA: NEGATIVE
Ketones, UA: NEGATIVE
Leukocytes,UA: NEGATIVE
Nitrite, UA: NEGATIVE
Protein,UA: NEGATIVE
RBC, UA: NEGATIVE
Specific Gravity, UA: >1.03 — ABNORMAL HIGH (ref 1.005–1.030)
Urobilinogen, Ur: 0.2 mg/dL (ref 0.2–1.0)
pH, UA: 5.5 (ref 5.0–7.5)

## 2023-09-02 LAB — MICROSCOPIC EXAMINATION: Epithelial Cells (non renal): >10 /HPF — AB (ref 0–10)

## 2023-09-08 ENCOUNTER — Ambulatory Visit
Admission: RE | Admit: 2023-09-08 | Discharge: 2023-09-08 | Disposition: A | Discharge status: Home / Self Care | Payer: BC Managed Care – PPO | Source: Ambulatory Visit | Attending: Urology | Admitting: Urology

## 2023-09-08 DIAGNOSIS — R3129 Other microscopic hematuria: Secondary | ICD-10-CM | POA: Diagnosis not present

## 2023-09-08 DIAGNOSIS — N200 Calculus of kidney: Secondary | ICD-10-CM | POA: Diagnosis not present

## 2023-09-14 ENCOUNTER — Telehealth (INDEPENDENT_AMBULATORY_CARE_PROVIDER_SITE_OTHER): Payer: BC Managed Care – PPO | Admitting: Nurse Practitioner

## 2023-09-14 ENCOUNTER — Encounter: Payer: Self-pay | Admitting: Nurse Practitioner

## 2023-09-14 VITALS — Temp 101.0°F | Ht 62.0 in | Wt 146.0 lb

## 2023-09-14 DIAGNOSIS — U071 COVID-19: Secondary | ICD-10-CM | POA: Diagnosis not present

## 2023-09-14 MED ORDER — MOLNUPIRAVIR EUA 200MG CAPSULE
4.0000 | ORAL_CAPSULE | Freq: Two times a day (BID) | ORAL | 0 refills | Status: DC
Start: 2023-09-14 — End: 2023-09-14

## 2023-09-14 MED ORDER — NIRMATRELVIR/RITONAVIR (PAXLOVID)TABLET
3.0000 | ORAL_TABLET | Freq: Two times a day (BID) | ORAL | 0 refills | Status: AC
Start: 2023-09-14 — End: 2023-09-19

## 2023-09-14 MED ORDER — IPRATROPIUM BROMIDE 0.06 % NA SOLN: 2.0000 | Freq: Four times a day (QID) | NASAL | 0 refills | Status: DC

## 2023-09-14 NOTE — Addendum Note (Signed)
Addended by: Bethanie Dicker on: 09/14/2023 04:20 PM   Modules accepted: Orders

## 2023-09-14 NOTE — Assessment & Plan Note (Signed)
Symptoms x 2 days. Interested in antiviral treatment. Will treat with Molnupiravir, patient has had prior and tolerated well. Counseled on common side effects. Advised adequate fluid intake. She can continue Advil/Tylenol as needed. Refills provided on nasal spray. Counseled on quarantine protocol. Work note provided for the week. Return precautions given to patient.

## 2023-09-14 NOTE — Progress Notes (Signed)
MyChart Video Visit    Virtual Visit via Video Note   This visit type was conducted because this format is felt to be most appropriate for this patient at this time. Physical exam was limited by quality of the video and audio technology used for the visit. CMA was able to get the patient set up on a video visit.  Patient location: Home. Patient and provider in visit Provider location: Office  I discussed the limitations of evaluation and management by telemedicine and the availability of in person appointments. The patient expressed understanding and agreed to proceed.  Visit Date: 09/14/2023  Today's healthcare provider: Bethanie Dicker, NP     Subjective:    Patient ID: Melinda Holder, female    DOB: Apr 11, 1971, 52 y.o.   MRN: [id number]  Chief Complaint  Patient presents with   Covid Positive    Tested this morning had a sore throat and a fever symptoms started yesterday afternoon. husband tested last Thursday and was positive.     HPI Patient with symptoms since yesterday afternoon. She tested positive for COVID this morning at home. She had COVID in 2022 and was treated with an antiviral, she is interested in having it again.   Respiratory illness:  Cough- No  Congestion-    Sinus- Yes, nasal   Chest- No  Post nasal drip- Yes  Sore throat- Yes  Shortness of breath- No  Fever- Yes, TMax 101  Fatigue/Myalgia- Yes Headache- Yes Nausea/Vomiting- No Taste disturbance- No  Smell disturbance- No  Covid exposure- Yes  Covid vaccination- x 2  Flu vaccination- UTD  Medications- Advil  Past Medical History:  Diagnosis Date   Amblyopia of eye, right    Anemia    Asthma    childhood   Breast cancer (HCC) 2019   Cancer (HCC) 10/27/2018   left breast   Difficult intubation 11/03/2016   DL x 2 with MAC 3 (CRNA and MD), very anterior. DL x 2 with Glidescope #3 Lo pro, bougie utilized. Easy mask.   Endometriosis    GERD (gastroesophageal reflux disease)     Migraine    Personal history of radiation therapy     Past Surgical History:  Procedure Laterality Date   ABLATION ON ENDOMETRIOSIS     BREAST BIOPSY Left 10/28/2018   INVASIVE MAMMARY CARCINOMA   BREAST EXCISIONAL BIOPSY Left 10/28/2018   BREAST LUMPECTOMY Left 11/17/2018   INVASIVE MAMMARY CARCINOMA   BREAST LUMPECTOMY Left 12/19/2018   re-excision Procedure: BREAST LUMPECTOMY;  Surgeon: Leafy Ro, MD;  Location: [facility];  Service: General;  Laterality: Left;   BREAST LUMPECTOMY WITH NEEDLE LOCALIZATION Left 11/17/2018   Procedure: BREAST LUMPECTOMY WITH NEEDLE LOCALIZATION;  Surgeon: Leafy Ro, MD;  Location: [facility];  Service: General;  Laterality: Left;   CLAVICLE SURGERY Left 10/2016   COLONOSCOPY WITH PROPOFOL N/A 06/27/2018   Procedure: COLONOSCOPY WITH PROPOFOL;  Surgeon: Midge Minium, MD;  Location: [facility];  Service: Endoscopy;  Laterality: N/A;   ESOPHAGOGASTRODUODENOSCOPY (EGD) WITH PROPOFOL N/A 06/27/2018   Procedure: ESOPHAGOGASTRODUODENOSCOPY (EGD) WITH PROPOFOL;  Surgeon: Midge Minium, MD;  Location: [facility];  Service: Endoscopy;  Laterality: N/A;   EYE MUSCLE SURGERY Right    child   ORIF CLAVICULAR FRACTURE Left 11/03/2016   Procedure: OPEN REDUCTION INTERNAL FIXATION (ORIF) CLAVICULAR FRACTURE;  Surgeon: Christena Flake, MD;  Location: [facility];  Service: Orthopedics;  Laterality: Left;   removal of fibroid     SENTINEL NODE  BIOPSY Left 11/17/2018   Procedure: SENTINEL NODE BIOPSY;  Surgeon: Leafy Ro, MD;  Location: [facility];  Service: General;  Laterality: Left;    Family History  Problem Relation Age of Onset   Lupus Mother    Hypertension Mother    Lymphoma Mother 60       B cell; currently 47   Hypertension Father        currently 36   Diabetes Father    Breast cancer Paternal Aunt 38       deceased 53   Lung cancer Paternal Aunt 81       deceased 46   Brain cancer Paternal Grandfather 28       deceased 71     Social History   Socioeconomic History   Marital status: Married    Spouse name: 0   Number of children: 0   Years of education: Not on file   Highest education level: Associate degree: academic program  Occupational History   Occupation: Merchandiser, retail    Comment: [employer]  Tobacco Use   Smoking status: Former    Current packs/day: 0.00    Average packs/day: 1 pack/day for 15.0 years (15.0 ttl pk-yrs)    Types: Cigarettes    Start date: 02/22/2000    Quit date: 02/21/2015    Years since quitting: 8.5   Smokeless tobacco: Never  Vaping Use   Vaping status: Former   Quit date: 01/01/2019  Substance and Sexual Activity   Alcohol use: Yes    Alcohol/week: 2.0 standard drinks of alcohol    Types: 2 Cans of beer per week    Comment: Occasional    Drug use: No   Sexual activity: Yes    Partners: Male    Comment: 1 female  Other Topics Concern   Not on file  Social History Narrative   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     ondansetron (ZOFRAN-ODT) 8 MG disintegrating tablet Take 1 tablet (8 mg total) by mouth every 8 (eight) hours as needed for nausea or vomiting. (Patient not taking: Reported on 09/14/2023) 20 tablet 0   ipratropium (ATROVENT) 0.06 % nasal spray Place 2 sprays into both nostrils 4 (four) times daily. (Patient not taking: Reported on  09/14/2023) 15 mL 12   No facility-administered medications prior to visit.    No Known Allergies  ROS See HPI    Objective:    Physical Exam  Temp (!) 101 F (38.3 C) Comment: pt reported  Ht 5\' 2"  (1.575 m)   Wt 146 lb (66.2 kg)   LMP 12/13/2020 (Exact Date) Comment: missed past 2 months   BMI 26.70 kg/m  Wt Readings from Last 3 Encounters:  09/14/23 146 lb (66.2 kg)  09/01/23  146 lb (66.2 kg)  08/02/23 146 lb (66.2 kg)   GENERAL: alert, oriented, appears well and in no acute distress   HEENT: atraumatic, conjunttiva clear, no obvious abnormalities on inspection of external nose and ears   NECK: normal movements of the head and neck   LUNGS: on inspection no signs of respiratory distress, breathing rate appears normal, no obvious gross SOB, gasping or wheezing   CV: no obvious cyanosis   MS: moves all visible extremities without noticeable abnormality   PSYCH/NEURO: pleasant and cooperative, no obvious depression or anxiety, speech and thought processing grossly intact     Assessment & Plan:   Problem List Items Addressed This Visit       Other   Positive self-administered antigen test for COVID-19 - Primary    Symptoms x 2 days. Interested in antiviral treatment. Will treat with Molnupiravir, patient has had prior and tolerated well. Counseled on common side effects. Advised adequate fluid intake. She can continue Advil/Tylenol as needed. Refills provided on nasal spray. Counseled on quarantine protocol. Work note provided for the week. Return precautions given to patient.       Relevant Medications   molnupiravir EUA (LAGEVRIO) 200 mg CAPS capsule   ipratropium (ATROVENT) 0.06 % nasal spray    I am having Melinda Holder start on molnupiravir EUA. I am also having her maintain her polyethylene glycol, ondansetron, hydrOXYzine, and ipratropium.  Meds ordered this encounter  Medications   molnupiravir EUA (LAGEVRIO) 200 mg CAPS capsule    Sig: Take 4  capsules (800 mg total) by mouth 2 (two) times daily for 5 days.    Dispense:  40 capsule    Refill:  0    Order Specific Question:   Supervising Provider    Answer:   Birdie Sons, ERIC G [[id number]]   ipratropium (ATROVENT) 0.06 % nasal spray    Sig: Place 2 sprays into both nostrils 4 (four) times daily.    Dispense:  15 mL    Refill:  0    Order Specific Question:   Supervising Provider    Answer:   Birdie Sons, ERIC G [[id number]]    I discussed the assessment and treatment plan with the patient. The patient was provided an opportunity to ask questions and all were answered. The patient agreed with the plan and demonstrated an understanding of the instructions.   The patient was advised to call back or seek an in-person evaluation if the symptoms worsen or if the condition fails to improve as anticipated.   Bethanie Dicker, NP [facility] [phone] (phone) [phone] (fax)  [facility]

## 2023-09-15 ENCOUNTER — Ambulatory Visit: Payer: BC Managed Care – PPO | Admitting: Gastroenterology

## 2023-09-17 ENCOUNTER — Encounter: Payer: Self-pay | Admitting: Nurse Practitioner

## 2023-09-17 ENCOUNTER — Ambulatory Visit
Admission: EM | Admit: 2023-09-17 | Discharge: 2023-09-17 | Disposition: A | Discharge status: Home / Self Care | Payer: BC Managed Care – PPO | Attending: Family Medicine | Admitting: Family Medicine

## 2023-09-17 DIAGNOSIS — J029 Acute pharyngitis, unspecified: Secondary | ICD-10-CM | POA: Insufficient documentation

## 2023-09-17 DIAGNOSIS — U071 COVID-19: Secondary | ICD-10-CM | POA: Diagnosis not present

## 2023-09-17 LAB — GROUP A STREP BY PCR: Group A Strep by PCR: NOT DETECTED

## 2023-09-17 MED ORDER — PROMETHAZINE-DM 6.25-15 MG/5ML PO SYRP: 5.0000 mL | ORAL_SOLUTION | Freq: Four times a day (QID) | ORAL | 0 refills | Status: DC | PRN

## 2023-09-17 NOTE — ED Provider Notes (Addendum)
[facility]    CSN: [id number] Arrival date & time: 09/17/23  1710      History   Chief Complaint Chief Complaint  Patient presents with   Covid Positive         HPI CATHLEEN NIEHUES is a 52 y.o. female.   HPI  History obtained from the patient. Placida presents for COVID.  Her husband gave her COVID.  Monday she started feeling bad with sore throat.  Tuesday, she tested positive for COVID. Has headache, body aches, cough, sore throat, nasal congestion and nausea.   Denies vomiting, diarrhea, abdominal pain. Did a virtual appt with her PCP who prescribed Paxlovid and other medications that were too expensive. She did pick up her nasal spray. Taking Advil and tylenol.        Past Medical History:  Diagnosis Date   Amblyopia of eye, right    Anemia    Asthma    childhood   Breast cancer (HCC) 2019   Cancer (HCC) 10/27/2018   left breast   Difficult intubation 11/03/2016   DL x 2 with MAC 3 (CRNA and MD), very anterior. DL x 2 with Glidescope #3 Lo pro, bougie utilized. Easy mask.   Endometriosis    GERD (gastroesophageal reflux disease)    Migraine    Personal history of radiation therapy     Patient Active Problem List   Diagnosis Date Noted   Positive self-administered antigen test for COVID-19 09/14/2023   Hot flashes due to menopause 08/02/2023   LFTs abnormal 08/02/2023   Microscopic hematuria 08/02/2023   Hematochezia 08/02/2023   Atypical chest pain 11/18/2022   Prediabetes 11/16/2022   Breast lesion 12/13/2020   Chronic headaches 11/14/2020   Need for prophylactic vaccination and inoculation against influenza 09/20/2019   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HCC)    Malignant neoplasm of left female breast (HCC) 11/03/2018   Iron deficiency anemia 05/20/2017   Encounter for general adult medical examination with abnormal findings 02/27/2016   Overweight (BMI 25.0-29.9) 09/03/2015    Past Surgical History:   Procedure Laterality Date   ABLATION ON ENDOMETRIOSIS     BREAST BIOPSY Left 10/28/2018   INVASIVE MAMMARY CARCINOMA   BREAST EXCISIONAL BIOPSY Left 10/28/2018   BREAST LUMPECTOMY Left 11/17/2018   INVASIVE MAMMARY CARCINOMA   BREAST LUMPECTOMY Left 12/19/2018   re-excision Procedure: BREAST LUMPECTOMY;  Surgeon: Leafy Ro, MD;  Location: [facility];  Service: General;  Laterality: Left;   BREAST LUMPECTOMY WITH NEEDLE LOCALIZATION Left 11/17/2018   Procedure: BREAST LUMPECTOMY WITH NEEDLE LOCALIZATION;  Surgeon: Leafy Ro, MD;  Location: [facility];  Service: General;  Laterality: Left;   CLAVICLE SURGERY Left 10/2016   COLONOSCOPY WITH PROPOFOL N/A 06/27/2018   Procedure: COLONOSCOPY WITH PROPOFOL;  Surgeon: Midge Minium, MD;  Location: [facility];  Service: Endoscopy;  Laterality: N/A;   ESOPHAGOGASTRODUODENOSCOPY (EGD) WITH PROPOFOL N/A 06/27/2018   Procedure: ESOPHAGOGASTRODUODENOSCOPY (EGD) WITH PROPOFOL;  Surgeon: Midge Minium, MD;  Location: [facility];  Service: Endoscopy;  Laterality: N/A;   EYE MUSCLE SURGERY Right    child   ORIF CLAVICULAR FRACTURE Left 11/03/2016   Procedure: OPEN REDUCTION INTERNAL FIXATION (ORIF) CLAVICULAR FRACTURE;  Surgeon: Christena Flake, MD;  Location: [facility];  Service: Orthopedics;  Laterality: Left;   removal of fibroid     SENTINEL NODE BIOPSY Left 11/17/2018   Procedure: SENTINEL NODE BIOPSY;  Surgeon: Leafy Ro, MD;  Location: [facility];  Service: General;  Laterality: Left;    OB History     Gravida  2   Para  0   Term  0   Preterm  0   AB  2   Living  0      SAB  0   IAB  0   Ectopic  0   Multiple  0   Live Births  0        Obstetric Comments  1st Menstrual Cycle:  14             Home Medications    Prior to Admission medications   Medication Sig Start Date End Date Taking? Authorizing Provider  hydrOXYzine (ATARAX) 10 MG tablet TAKE 1 TABLET BY MOUTH EVERYDAY AT BEDTIME 08/06/23   Yes [name], MD  ipratropium (ATROVENT) 0.06 % nasal spray Place 2 sprays into both nostrils 4 (four) times daily. 09/14/23  Yes Bethanie Dicker, NP  nirmatrelvir/ritonavir (PAXLOVID) 20 x 150 MG & 10 x 100MG  TABS Take 3 tablets by mouth 2 (two) times daily for 5 days. (Take nirmatrelvir 150 mg two tablets twice daily for 5 days and ritonavir 100 mg one tablet twice daily for 5 days) Patient GFR is 87 09/14/23 09/19/23 Yes Bethanie Dicker, NP  ondansetron (ZOFRAN-ODT) 8 MG disintegrating tablet Take 1 tablet (8 mg total) by mouth every 8 (eight) hours as needed for nausea or vomiting. 06/07/23  Yes Becky Augusta, NP  polyethylene glycol (MIRALAX / GLYCOLAX) 17 g packet Take 17 g by mouth as needed.   Yes [provider]  promethazine-dextromethorphan (PROMETHAZINE-DM) 6.25-15 MG/5ML syrup Take 5 mLs by mouth 4 (four) times daily as needed. 09/17/23  Yes Lutricia Widjaja, DO  fluticasone (FLONASE) 50 MCG/ACT nasal spray Place 2 sprays into both nostrils daily. 08/26/20 02/18/21  Domenick Gong, MD    Family History Family History  Problem Relation Age of Onset   Lupus Mother    Hypertension Mother    Lymphoma Mother 68       B cell; currently 85   Hypertension Father        currently 13   Diabetes Father    Breast cancer Paternal Aunt 64       deceased 59   Lung cancer Paternal Aunt 87       deceased 16   Brain cancer Paternal Grandfather 61       deceased 54    Social History Social History   Tobacco Use   Smoking status: Former    Current packs/day: 0.00    Average packs/day: 1 pack/day for 15.0 years (15.0 ttl pk-yrs)    Types: Cigarettes    Start date: 02/22/2000    Quit date: 02/21/2015    Years since quitting: 8.5   Smokeless tobacco: Never  Vaping Use   Vaping status: Former   Quit date: 01/01/2019  Substance Use Topics   Alcohol use: Yes    Alcohol/week: 2.0 standard drinks of alcohol    Types: 2 Cans of beer per week    Comment: Occasional    Drug use: No      Allergies   Patient has no known allergies.   Review of Systems Review of Systems: negative unless otherwise stated in HPI.      Physical Exam Triage Vital Signs ED Triage Vitals  Encounter Vitals Group     BP 09/17/23 1739 (!) 148/84     Systolic BP Percentile --      Diastolic BP  Percentile --      Pulse Rate 09/17/23 1739 88     Resp --      Temp 09/17/23 1739 98.1 F (36.7 C)     Temp Source 09/17/23 1739 Oral     SpO2 09/17/23 1739 99 %     Weight 09/17/23 1737 148 lb (67.1 kg)     Height 09/17/23 1737 5\' 2"  (1.575 m)     Head Circumference --      Peak Flow --      Pain Score 09/17/23 1737 9     Pain Loc --      Pain Education --      Exclude from Growth Chart --    No data found.  Updated Vital Signs BP (!) 148/84 (BP Location: Right Arm)   Pulse 88   Temp 98.1 F (36.7 C) (Oral)   Ht 5\' 2"  (1.575 m)   Wt 67.1 kg   LMP 12/13/2020 (Exact Date) Comment: missed past 2 months   SpO2 99%   BMI 27.07 kg/m   Visual Acuity Right Eye Distance:   Left Eye Distance:   Bilateral Distance:    Right Eye Near:   Left Eye Near:    Bilateral Near:     Physical Exam GEN:     alert, ill but non-toxic appearing female in no distress    HENT:  mucus membranes moist, oropharyngeal without lesions, +erythema, no tonsillar hypertrophy or exudates, clear nasal discharge EYES:   pupils equal and reactive, no scleral injection or discharge NECK:  normal ROM, no meningismus   RESP:  no increased work of breathing, clear to auscultation bilaterally CVS:   regular rate and rhythm Skin:   warm and dry, no rash on visible skin    UC Treatments / Results  Labs (all labs ordered are listed, but only abnormal results are displayed) Labs Reviewed  GROUP A STREP BY PCR    EKG   Radiology No results found.  Procedures Procedures (including critical care time)  Medications Ordered in UC Medications - No data to display  Initial Impression / Assessment and  Plan / UC Course  I have reviewed the triage vital signs and the nursing notes.  Pertinent labs & imaging results that were available during my care of the patient were reviewed by me and considered in my medical decision making (see chart for details).       Pt is a 52 y.o. female who presents for 2-3 days of respiratory symptoms. Jaquelinne is afebrile here. Satting well on room air. Overall pt is ill but non-toxic appearing, well hydrated, without respiratory distress. Pulmonary exam is unremarkable.  Home COVID testing was positive. Strep PCR is negative. History consistent with viral respiratory illness. Discussed symptomatic treatment.  Explained lack of efficacy of antibiotics in viral disease.  Typical duration of symptoms discussed. Promethazine DM for cough. Work note provided. Stressed importance of hydration.   Return and ED precautions given and voiced understanding. Discussed MDM, treatment plan and plan for follow-up with patient who agrees with plan.     Final Clinical Impressions(s) / UC Diagnoses   Final diagnoses:  Positive self-administered antigen test for COVID-19  Sore throat     Discharge Instructions      Your strep test is negative.  Your home test for COVID-19 was positive, meaning that you were infected with the novel coronavirus and could give the germ to others.  The recommendations suggest returning to normal activities when, for  at least 24 hours, symptoms are improving overall, and if a fever was present, it has been gone without use of a fever-reducing medication.  You should wear a mask for the next 5 days to prevent the spread of disease. Please continue good preventive care measures, including:  frequent hand-washing, avoid touching your face, cover coughs/sneezes, stay out of crowds and keep a 6 foot distance from others.  Go to the nearest hospital emergency room if fever/cough/breathlessness are severe or illness seems like a threat to life.  If your  were prescribed medication. Stop by the pharmacy to pick it up. You can take Tylenol and/or Ibuprofen as needed for fever reduction and pain relief.    For cough: honey 1/2 to 1 teaspoon (you can dilute the honey in water or another fluid).  You can also use guaifenesin and dextromethorphan for cough. You can use a humidifier for chest congestion and cough.  If you don't have a humidifier, you can sit in the bathroom with the hot shower running.      For sore throat: try warm salt water gargles, Mucinex sore throat cough drops or cepacol lozenges, throat spray, warm tea or water with lemon/honey, popsicles or ice, or OTC cold relief medicine for throat discomfort. You can also purchase chloraseptic spray at the pharmacy or dollar store.   For congestion: take a daily anti-histamine like Zyrtec, Claritin, and a oral decongestant, such as pseudoephedrine.  You can also use Flonase 1-2 sprays in each nostril daily. Afrin is also a good option, if you do not have high blood pressure.    It is important to stay hydrated: drink plenty of fluids (water, gatorade/powerade/pedialyte, juices, or teas) to keep your throat moisturized and help further relieve irritation/discomfort.       ED Prescriptions     Medication Sig Dispense Auth. Provider   promethazine-dextromethorphan (PROMETHAZINE-DM) 6.25-15 MG/5ML syrup Take 5 mLs by mouth 4 (four) times daily as needed. 118 mL Katha Cabal, DO      PDMP not reviewed this encounter.       Katha Cabal, DO 09/17/23 1826

## 2023-09-17 NOTE — Discharge Instructions (Signed)
Your strep test is negative.  Your home test for COVID-19 was positive, meaning that you were infected with the novel coronavirus and could give the germ to others.  The recommendations suggest returning to normal activities when, for at least 24 hours, symptoms are improving overall, and if a fever was present, it has been gone without use of a fever-reducing medication.  You should wear a mask for the next 5 days to prevent the spread of disease. Please continue good preventive care measures, including:  frequent hand-washing, avoid touching your face, cover coughs/sneezes, stay out of crowds and keep a 6 foot distance from others.  Go to the nearest hospital emergency room if fever/cough/breathlessness are severe or illness seems like a threat to life.  If your were prescribed medication. Stop by the pharmacy to pick it up. You can take Tylenol and/or Ibuprofen as needed for fever reduction and pain relief.    For cough: honey 1/2 to 1 teaspoon (you can dilute the honey in water or another fluid).  You can also use guaifenesin and dextromethorphan for cough. You can use a humidifier for chest congestion and cough.  If you don't have a humidifier, you can sit in the bathroom with the hot shower running.      For sore throat: try warm salt water gargles, Mucinex sore throat cough drops or cepacol lozenges, throat spray, warm tea or water with lemon/honey, popsicles or ice, or OTC cold relief medicine for throat discomfort. You can also purchase chloraseptic spray at the pharmacy or dollar store.   For congestion: take a daily anti-histamine like Zyrtec, Claritin, and a oral decongestant, such as pseudoephedrine.  You can also use Flonase 1-2 sprays in each nostril daily. Afrin is also a good option, if you do not have high blood pressure.    It is important to stay hydrated: drink plenty of fluids (water, gatorade/powerade/pedialyte, juices, or teas) to keep your throat moisturized and help further  relieve irritation/discomfort.

## 2023-09-17 NOTE — ED Triage Notes (Signed)
Pt c/o positive covid test x4days Pt is having headaches, facial pain, and body aches.  Pt states that they called in a antiviral but it was 600 dollars with insurance and she can not afford it.  Pt states that she still feels bad and the symptoms have gotten worse.  Pt has not taken any OTC medication for symptoms.

## 2023-09-29 ENCOUNTER — Encounter: Payer: Self-pay | Admitting: Oncology

## 2023-09-29 ENCOUNTER — Ambulatory Visit: Payer: BC Managed Care – PPO | Admitting: Urology

## 2023-09-29 VITALS — BP 138/87 | HR 90

## 2023-09-29 DIAGNOSIS — R3129 Other microscopic hematuria: Secondary | ICD-10-CM

## 2023-09-29 DIAGNOSIS — N852 Hypertrophy of uterus: Secondary | ICD-10-CM

## 2023-09-29 DIAGNOSIS — N200 Calculus of kidney: Secondary | ICD-10-CM

## 2023-09-29 NOTE — Progress Notes (Unsigned)
   09/29/23  CC:  Chief Complaint  Patient presents with   Cysto    HPI: 52 year old female with a personal history of microscopic hematuria who presents today for cystoscopy.  Interval, she underwent renal ultrasound which was unremarkable for any GU pathology.  She has a stable known kidney stone for which she is asymptomatic.  Please see the previous notes for details.  Last menstrual period 12/13/2020. NED. A&Ox3.   No respiratory distress   Abd soft, NT, ND Normal external genitalia with patent urethral meatus  Cystoscopy Procedure Note  Patient identification was confirmed, informed consent was obtained, and patient was prepped using Betadine solution.  Lidocaine jelly was administered per urethral meatus.    Procedure: - Flexible cystoscope introduced, without any difficulty.   - Thorough search of the bladder revealed:    normal urethral meatus    normal urothelium    no stones    no ulcers     no tumors    no urethral polyps    no trabeculation    Extrinsic posterior wall compression from known enlarged uterus appreciated cystoscopically  - Ureteral orifices were normal in position and appearance.  Post-Procedure: - Patient tolerated the procedure well  Assessment/ Plan:  1. Microscopic hematuria Benign hematuria evaluation, cystoscopy today is unremarkable which is reassuring  Consider referral back if the degree of hematuria worsens, she develops gross hematuria or persist 2 to 3 years from now - Urinalysis, Complete  2. Kidney stone on left side Incidental, chronic and asymptomatic.  Recommend conservative approach.  She is agreeable this plan.  Warning symptoms were reviewed.  3. Enlarged uterus Incidental, likely benign.  She no longer has an OB/GYN but is interested in being reestablished.  She is fine to follow-up on this incidental finding.  She does note a personal history of uterine fibroids.  See return recommendations as above in  #1  Vanna Scotland, MD

## 2023-09-30 LAB — URINALYSIS, COMPLETE
Bilirubin, UA: NEGATIVE
Glucose, UA: NEGATIVE
Ketones, UA: NEGATIVE
Leukocytes,UA: NEGATIVE
Nitrite, UA: NEGATIVE
Protein,UA: NEGATIVE
RBC, UA: NEGATIVE
Specific Gravity, UA: 1.025 (ref 1.005–1.030)
Urobilinogen, Ur: 0.2 mg/dL (ref 0.2–1.0)
pH, UA: 6.5 (ref 5.0–7.5)

## 2023-09-30 LAB — MICROSCOPIC EXAMINATION

## 2023-10-14 ENCOUNTER — Encounter: Payer: Self-pay | Admitting: Oncology

## 2023-10-25 ENCOUNTER — Encounter: Payer: Self-pay | Admitting: Gastroenterology

## 2023-10-25 ENCOUNTER — Ambulatory Visit (INDEPENDENT_AMBULATORY_CARE_PROVIDER_SITE_OTHER): Payer: BC Managed Care – PPO | Admitting: Gastroenterology

## 2023-10-25 VITALS — BP 146/85 | HR 85 | Temp 98.5°F | Ht 62.0 in | Wt 147.0 lb

## 2023-10-25 DIAGNOSIS — Z01411 Encounter for gynecological examination (general) (routine) with abnormal findings: Secondary | ICD-10-CM | POA: Diagnosis not present

## 2023-10-25 DIAGNOSIS — Z8371 Family history of adenomatous and serrated polyps: Secondary | ICD-10-CM | POA: Diagnosis not present

## 2023-10-25 DIAGNOSIS — Z23 Encounter for immunization: Secondary | ICD-10-CM | POA: Diagnosis not present

## 2023-10-25 DIAGNOSIS — K625 Hemorrhage of anus and rectum: Secondary | ICD-10-CM

## 2023-10-25 DIAGNOSIS — Z83719 Family history of colon polyps, unspecified: Secondary | ICD-10-CM

## 2023-10-25 DIAGNOSIS — N852 Hypertrophy of uterus: Secondary | ICD-10-CM | POA: Diagnosis not present

## 2023-10-25 DIAGNOSIS — Z124 Encounter for screening for malignant neoplasm of cervix: Secondary | ICD-10-CM | POA: Diagnosis not present

## 2023-10-25 MED ORDER — NA SULFATE-K SULFATE-MG SULF 17.5-3.13-1.6 GM/177ML PO SOLN: 1.0000 | Freq: Once | ORAL | 0 refills | Status: AC

## 2023-10-25 NOTE — Addendum Note (Signed)
Addended by: Roena Malady on: 10/25/2023 04:34 PM   Modules accepted: Orders

## 2023-10-25 NOTE — Progress Notes (Signed)
Gastroenterology Consultation  Referring Provider:     [name], MD Primary Care Physician:  [name], MD Primary Gastroenterologist:  Dr. Servando Snare     Reason for Consultation:     Rectal bleeding        HPI:   Melinda Holder is a 52 y.o. y/o female referred for consultation & management of rectal bleeding by Dr. Birdie Sons, Yehuda Mao, MD. This patient comes in today after being seen by me in the past for an EGD and colonoscopy.  The patient had H. pylori found on her upper endoscopy back in 2019.  She also had biopsies taken of the colon that were benign.  The stool antigen done in November of that year came back negative confirming eradication of the bacteria.  The patient was noted in August to have had bright red blood in the commode and was sent for a GI evaluation. The patient has a father with adenomatous polyps.  Her symptoms have not recurred and she is not having any further bleeding.  She also denies any unexplained weight loss fevers chills nausea vomiting black stools or bloody stools.  She also states that she has not had any more rectal bleeding and she thinks it is her hemorrhoids.  Past Medical History:  Diagnosis Date   Amblyopia of eye, right    Anemia    Asthma    childhood   Breast cancer (HCC) 2019   Cancer (HCC) 10/27/2018   left breast   Difficult intubation 11/03/2016   DL x 2 with MAC 3 (CRNA and MD), very anterior. DL x 2 with Glidescope #3 Lo pro, bougie utilized. Easy mask.   Endometriosis    GERD (gastroesophageal reflux disease)    Migraine    Personal history of radiation therapy     Past Surgical History:  Procedure Laterality Date   ABLATION ON ENDOMETRIOSIS     BREAST BIOPSY Left 10/28/2018   INVASIVE MAMMARY CARCINOMA   BREAST EXCISIONAL BIOPSY Left 10/28/2018   BREAST LUMPECTOMY Left 11/17/2018   INVASIVE MAMMARY CARCINOMA   BREAST LUMPECTOMY Left 12/19/2018   re-excision Procedure: BREAST LUMPECTOMY;  Surgeon: Leafy Ro, MD;  Location: [facility];  Service: General;  Laterality: Left;   BREAST LUMPECTOMY WITH NEEDLE LOCALIZATION Left 11/17/2018   Procedure: BREAST LUMPECTOMY WITH NEEDLE LOCALIZATION;  Surgeon: Leafy Ro, MD;  Location: [facility];  Service: General;  Laterality: Left;   CLAVICLE SURGERY Left 10/2016   COLONOSCOPY WITH PROPOFOL N/A 06/27/2018   Procedure: COLONOSCOPY WITH PROPOFOL;  Surgeon: Midge Minium, MD;  Location: [facility];  Service: Endoscopy;  Laterality: N/A;   ESOPHAGOGASTRODUODENOSCOPY (EGD) WITH PROPOFOL N/A 06/27/2018   Procedure: ESOPHAGOGASTRODUODENOSCOPY (EGD) WITH PROPOFOL;  Surgeon: Midge Minium, MD;  Location: [facility];  Service: Endoscopy;  Laterality: N/A;   EYE MUSCLE SURGERY Right    child   ORIF CLAVICULAR FRACTURE Left 11/03/2016   Procedure: OPEN REDUCTION INTERNAL FIXATION (ORIF) CLAVICULAR FRACTURE;  Surgeon: Christena Flake, MD;  Location: [facility];  Service: Orthopedics;  Laterality: Left;   removal of fibroid     SENTINEL NODE BIOPSY Left 11/17/2018   Procedure: SENTINEL NODE BIOPSY;  Surgeon: Leafy Ro, MD;  Location: [facility];  Service: General;  Laterality: Left;    Prior to Admission medications   Medication Sig Start Date End Date Taking? Authorizing Provider  hydrOXYzine (ATARAX) 10 MG tablet TAKE 1 TABLET BY MOUTH EVERYDAY AT BEDTIME 08/06/23   Sonnenberg,  Yehuda Mao, MD  ipratropium (ATROVENT) 0.06 % nasal spray Place 2 sprays into both nostrils 4 (four) times daily. 09/14/23   Bethanie Dicker, NP  ondansetron (ZOFRAN-ODT) 8 MG disintegrating tablet Take 1 tablet (8 mg total) by mouth every 8 (eight) hours as needed for nausea or vomiting. 06/07/23   Becky Augusta, NP  polyethylene glycol (MIRALAX / GLYCOLAX) 17 g packet Take 17 g by mouth as needed.    [provider]  promethazine-dextromethorphan (PROMETHAZINE-DM) 6.25-15 MG/5ML syrup Take 5 mLs by mouth 4 (four) times daily as needed. 09/17/23   Brimage, Seward Meth, DO   fluticasone (FLONASE) 50 MCG/ACT nasal spray Place 2 sprays into both nostrils daily. 08/26/20 02/18/21  Domenick Gong, MD    Family History  Problem Relation Age of Onset   Lupus Mother    Hypertension Mother    Lymphoma Mother 74       B cell; currently 77   Hypertension Father        currently 45   Diabetes Father    Breast cancer Paternal Aunt 18       deceased 58   Lung cancer Paternal Aunt 16       deceased 73   Brain cancer Paternal Grandfather 98       deceased 59     Social History   Tobacco Use   Smoking status: Former    Current packs/day: 0.00    Average packs/day: 1 pack/day for 15.0 years (15.0 ttl pk-yrs)    Types: Cigarettes    Start date: 02/22/2000    Quit date: 02/21/2015    Years since quitting: 8.6   Smokeless tobacco: Never  Vaping Use   Vaping status: Former   Quit date: 01/01/2019  Substance Use Topics   Alcohol use: Yes    Alcohol/week: 2.0 standard drinks of alcohol    Types: 2 Cans of beer per week    Comment: Occasional    Drug use: No    Allergies as of 10/25/2023   (No Known Allergies)    Review of Systems:    All systems reviewed and negative except where noted in HPI.   Physical Exam:  LMP 12/13/2020 (Exact Date) Comment: missed past 2 months  Patient's last menstrual period was 12/13/2020 (exact date). General:   Alert,  Well-developed, well-nourished, pleasant and cooperative in NAD Head:  Normocephalic and atraumatic. Eyes:  Sclera clear, no icterus.   Conjunctiva pink. Ears:  Normal auditory acuity. Skin:  Intact without significant lesions or rashes.  No jaundice. Psych:  Alert and cooperative. Normal mood and affect.  Imaging Studies: No results found.  Assessment and Plan:   Melinda Holder is a 52 y.o. y/o female who comes in today with rectal bleeding which is likely hemorrhoidal.  The patient has a family history of adenomatous polyps in her father.  The patient will be set up for colonoscopy due to family  history of polyps.  She has been explained the plan and agrees with it and will be set up in the next avail appointment.    Midge Minium, MD. Clementeen Graham    Note: This dictation was prepared with Dragon dictation along with smaller phrase technology. Any transcriptional errors that result from this process are unintentional.

## 2023-11-11 ENCOUNTER — Encounter: Payer: Self-pay | Admitting: Gastroenterology

## 2023-11-11 ENCOUNTER — Other Ambulatory Visit: Payer: Self-pay

## 2023-11-14 ENCOUNTER — Other Ambulatory Visit: Payer: Self-pay | Admitting: Family Medicine

## 2023-11-17 ENCOUNTER — Encounter: Payer: Self-pay | Admitting: Gastroenterology

## 2023-11-17 NOTE — Anesthesia Preprocedure Evaluation (Addendum)
Anesthesia Evaluation  Patient identified by MRN, date of birth, ID band Patient awake    Reviewed: Allergy & Precautions, H&P , NPO status , Patient's Chart, lab work & pertinent test results  History of Anesthesia Complications (+) DIFFICULT AIRWAY and history of anesthetic complications  Airway Mallampati: IV  TM Distance: <3 FB Neck ROM: Full  Mouth opening: Limited Mouth Opening Comment: 1 FB TMD noted, very limited oral opening, known difficult airway, anterior airway  11-03-16 : DL x1 with MAC 3 per CRNA with grade II-III view; unable to pass ETT due to pt being anterior; DL X 2 with McGrath #3 per CRNA; unable to pass ETT due to anterior Grade II-III view; DL x3 with Glidescope #3 Lo pro per Dr Pernell Dupre with same view as above; unable to pass ETT utilizing Glidescope stylet due to pt being anterior; DL x4 with Glidescope #3 Lo pro; bougie utilized and pt was successfully intubated with ETT 7 with + ETCO2 and Bilat equal breath sounds.  Pt VS stable throughout and easy to mask ventilate. Dental no notable dental hx.    Pulmonary asthma , former smoker   Pulmonary exam normal breath sounds clear to auscultation       Cardiovascular negative cardio ROS Normal cardiovascular exam Rhythm:Regular Rate:Normal     Neuro/Psych  Headaches  negative psych ROS   GI/Hepatic Neg liver ROS,GERD  ,,  Endo/Other  negative endocrine ROS    Renal/GU negative Renal ROS  negative genitourinary   Musculoskeletal negative musculoskeletal ROS (+)    Abdominal   Peds negative pediatric ROS (+)  Hematology  (+) Blood dyscrasia, anemia   Anesthesia Other Findings Endometriosis  Amblyopia of eye, right Anemia  Difficult intubation in 2017, was easy mask ventilation, see airway note Asthma  GERD (gastroesophageal reflux disease) Personal history of radiation therapy Migraine Cancer (HCC)  Breast cancer (HCC) Iron deficiency  anemia  Note history of difficult intubation!!!! 11-03-16   Endotracheal Airway: Oral Technique Used For Successful Placement: Stylet ETT Size (mm): 7.0 Number of Attempts at Approach: 4 (see note below)     Reproductive/Obstetrics negative OB ROS                             Anesthesia Physical Anesthesia Plan  ASA: 2  Anesthesia Plan: General   Post-op Pain Management:    Induction: Intravenous  PONV Risk Score and Plan:   Airway Management Planned: Natural Airway and Nasal Cannula  Additional Equipment:   Intra-op Plan:   Post-operative Plan:   Informed Consent: I have reviewed the patients History and Physical, chart, labs and discussed the procedure including the risks, benefits and alternatives for the proposed anesthesia with the patient or authorized representative who has indicated his/her understanding and acceptance.     Dental Advisory Given  Plan Discussed with: Anesthesiologist, CRNA and Surgeon  Anesthesia Plan Comments: (Patient consented for risks of anesthesia including but not limited to:  - adverse reactions to medications - risk of airway placement if required - damage to eyes, teeth, lips or other oral mucosa - nerve damage due to positioning  - sore throat or hoarseness - Damage to heart, brain, nerves, lungs, other parts of body or loss of life  Patient voiced understanding and assent.)        Anesthesia Quick Evaluation

## 2023-11-19 ENCOUNTER — Encounter: Payer: Self-pay | Admitting: Gastroenterology

## 2023-11-19 ENCOUNTER — Ambulatory Visit
Admission: RE | Admit: 2023-11-19 | Discharge: 2023-11-19 | Disposition: A | Discharge status: Home / Self Care | Payer: BC Managed Care – PPO | Attending: Gastroenterology | Admitting: Gastroenterology

## 2023-11-19 ENCOUNTER — Ambulatory Visit: Payer: BC Managed Care – PPO | Admitting: Anesthesiology

## 2023-11-19 ENCOUNTER — Encounter
Admission: RE | Disposition: A | Discharge status: Home / Self Care | Payer: Self-pay | Source: Home / Self Care | Attending: Gastroenterology

## 2023-11-19 ENCOUNTER — Other Ambulatory Visit: Payer: Self-pay

## 2023-11-19 DIAGNOSIS — K219 Gastro-esophageal reflux disease without esophagitis: Secondary | ICD-10-CM | POA: Insufficient documentation

## 2023-11-19 DIAGNOSIS — Z853 Personal history of malignant neoplasm of breast: Secondary | ICD-10-CM | POA: Insufficient documentation

## 2023-11-19 DIAGNOSIS — K635 Polyp of colon: Secondary | ICD-10-CM | POA: Diagnosis not present

## 2023-11-19 DIAGNOSIS — K640 First degree hemorrhoids: Secondary | ICD-10-CM | POA: Diagnosis not present

## 2023-11-19 DIAGNOSIS — Z860101 Personal history of adenomatous and serrated colon polyps: Secondary | ICD-10-CM | POA: Diagnosis not present

## 2023-11-19 DIAGNOSIS — D509 Iron deficiency anemia, unspecified: Secondary | ICD-10-CM | POA: Diagnosis not present

## 2023-11-19 DIAGNOSIS — Z87891 Personal history of nicotine dependence: Secondary | ICD-10-CM | POA: Diagnosis not present

## 2023-11-19 DIAGNOSIS — Z860100 Personal history of colon polyps, unspecified: Secondary | ICD-10-CM

## 2023-11-19 DIAGNOSIS — J45909 Unspecified asthma, uncomplicated: Secondary | ICD-10-CM | POA: Diagnosis not present

## 2023-11-19 DIAGNOSIS — Z1211 Encounter for screening for malignant neoplasm of colon: Secondary | ICD-10-CM | POA: Insufficient documentation

## 2023-11-19 DIAGNOSIS — Z83719 Family history of colon polyps, unspecified: Secondary | ICD-10-CM | POA: Diagnosis not present

## 2023-11-19 HISTORY — PX: COLONOSCOPY WITH PROPOFOL: SHX5780

## 2023-11-19 HISTORY — DX: Iron deficiency anemia, unspecified: D50.9

## 2023-11-19 SURGERY — COLONOSCOPY WITH PROPOFOL
Anesthesia: General

## 2023-11-19 MED ORDER — PROPOFOL 10 MG/ML IV BOLUS
INTRAVENOUS | Status: AC
  Filled 2023-11-19: qty 20

## 2023-11-19 MED ORDER — SODIUM CHLORIDE 0.9 % IV SOLN
INTRAVENOUS | Status: DC
Start: 2023-11-19 — End: 2023-11-19

## 2023-11-19 MED ORDER — PROPOFOL 10 MG/ML IV BOLUS
INTRAVENOUS | Status: DC | PRN
  Administered 2023-11-19: 50 mg via INTRAVENOUS
  Administered 2023-11-19: 100 mg via INTRAVENOUS
  Administered 2023-11-19: 50 mg via INTRAVENOUS
  Administered 2023-11-19: 25 mg via INTRAVENOUS

## 2023-11-19 MED ORDER — STERILE WATER FOR IRRIGATION IR SOLN: Status: DC | PRN

## 2023-11-19 MED ORDER — LACTATED RINGERS IV SOLN: INTRAVENOUS | Status: DC

## 2023-11-19 SURGICAL SUPPLY — 7 items
FORCEPS BIOP RAD 4 LRG CAP 4 (CUTTING FORCEPS) IMPLANT
GOWN CVR UNV OPN BCK APRN NK (MISCELLANEOUS) ×2 IMPLANT
KIT PRC NS LF DISP ENDO (KITS) ×1 IMPLANT
MANIFOLD NEPTUNE II (INSTRUMENTS) ×1 IMPLANT
SNARE COLD EXACTO (MISCELLANEOUS) IMPLANT
TRAP ETRAP POLY (MISCELLANEOUS) IMPLANT
WATER STERILE IRR 250ML POUR (IV SOLUTION) ×1 IMPLANT

## 2023-11-19 NOTE — H&P (Signed)
Midge Minium, MD [facility] [address], [address] [place], [place] [address] Phone:[phone] Fax : [phone]  Primary Care Physician:  [name], MD Primary Gastroenterologist:  Dr. Servando Snare  Pre-Procedure History & Physical: HPI:  Melinda Holder is a 52 y.o. female is here for an colonoscopy.   Past Medical History:  Diagnosis Date   Amblyopia of eye, right    Anemia    in the past   Asthma    childhood   Breast cancer (HCC) 2019   Cancer (HCC) 10/27/2018   left breast   Difficult intubation 11/03/2016   DL x 2 with MAC 3 (CRNA and MD), very anterior. DL x 2 with Glidescope #3 Lo pro, bougie utilized. Easy mask.   Endometriosis    GERD (gastroesophageal reflux disease)    not for a while, tomato products   Iron deficiency anemia    Migraine    2 months ago   Personal history of radiation therapy     Past Surgical History:  Procedure Laterality Date   ABLATION ON ENDOMETRIOSIS     BREAST BIOPSY Left 10/28/2018   INVASIVE MAMMARY CARCINOMA   BREAST EXCISIONAL BIOPSY Left 10/28/2018   BREAST LUMPECTOMY Left 11/17/2018   INVASIVE MAMMARY CARCINOMA   BREAST LUMPECTOMY Left 12/19/2018   re-excision Procedure: BREAST LUMPECTOMY;  Surgeon: Leafy Ro, MD;  Location: [facility];  Service: General;  Laterality: Left;   BREAST LUMPECTOMY WITH NEEDLE LOCALIZATION Left 11/17/2018   Procedure: BREAST LUMPECTOMY WITH NEEDLE LOCALIZATION;  Surgeon: Leafy Ro, MD;  Location: [facility];  Service: General;  Laterality: Left;   CLAVICLE SURGERY Left 10/2016   COLONOSCOPY WITH PROPOFOL N/A 06/27/2018   Procedure: COLONOSCOPY WITH PROPOFOL;  Surgeon: Midge Minium, MD;  Location: [facility];  Service: Endoscopy;  Laterality: N/A;   ESOPHAGOGASTRODUODENOSCOPY (EGD) WITH PROPOFOL N/A 06/27/2018   Procedure: ESOPHAGOGASTRODUODENOSCOPY (EGD) WITH PROPOFOL;  Surgeon: Midge Minium, MD;  Location: [facility];  Service: Endoscopy;  Laterality: N/A;   EYE MUSCLE  SURGERY Right    child   ORIF CLAVICULAR FRACTURE Left 11/03/2016   Procedure: OPEN REDUCTION INTERNAL FIXATION (ORIF) CLAVICULAR FRACTURE;  Surgeon: Christena Flake, MD;  Location: [facility];  Service: Orthopedics;  Laterality: Left;   removal of fibroid     SENTINEL NODE BIOPSY Left 11/17/2018   Procedure: SENTINEL NODE BIOPSY;  Surgeon: Leafy Ro, MD;  Location: [facility];  Service: General;  Laterality: Left;    Prior to Admission medications   Medication Sig Start Date End Date Taking? Authorizing Provider  hydrOXYzine (ATARAX) 10 MG tablet TAKE 1 TABLET BY MOUTH EVERYDAY AT BEDTIME 11/15/23  Yes [name], MD  polyethylene glycol (MIRALAX / GLYCOLAX) 17 g packet Take 17 g by mouth as needed.   Yes [provider]  promethazine-dextromethorphan (PROMETHAZINE-DM) 6.25-15 MG/5ML syrup Take 5 mLs by mouth 4 (four) times daily as needed. 09/17/23  Yes Brimage, Vondra, DO  ipratropium (ATROVENT) 0.06 % nasal spray Place 2 sprays into both nostrils 4 (four) times daily. Patient taking differently: Place 2 sprays into both nostrils 4 (four) times daily. As needed 09/14/23   Bethanie Dicker, NP  ondansetron (ZOFRAN-ODT) 8 MG disintegrating tablet Take 1 tablet (8 mg total) by mouth every 8 (eight) hours as needed for nausea or vomiting. 06/07/23   Becky Augusta, NP  fluticasone [facility]) 50 MCG/ACT nasal spray Place 2 sprays into both nostrils daily. 08/26/20 02/18/21  Domenick Gong, MD    Allergies as  of 10/25/2023   (No Known Allergies)    Family History  Problem Relation Age of Onset   Lupus Mother    Hypertension Mother    Lymphoma Mother 80       B cell; currently 11   Hypertension Father        currently 4   Diabetes Father    Breast cancer Paternal Aunt 75       deceased 14   Lung cancer Paternal Aunt 4       deceased 14   Brain cancer Paternal Grandfather 40       deceased 54    Social History   Socioeconomic History   Marital status: Married    Spouse  name: 0   Number of children: 0   Years of education: Not on file   Highest education level: Associate degree: academic program  Occupational History   Occupation: Merchandiser, retail    Comment: [employer]  Tobacco Use   Smoking status: Former    Current packs/day: 0.00    Average packs/day: 1 pack/day for 15.0 years (15.0 ttl pk-yrs)    Types: Cigarettes    Start date: 02/22/2000    Quit date: 02/21/2015    Years since quitting: 8.7   Smokeless tobacco: Never  Vaping Use   Vaping status: Former   Quit date: 01/01/2019  Substance and Sexual Activity   Alcohol use: Yes    Alcohol/week: 2.0 standard drinks of alcohol    Types: 2 Cans of beer per week    Comment: Occasional    Drug use: No   Sexual activity: Yes    Partners: Male    Comment: 1 female  Other Topics Concern   Not on file  Social History Narrative   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52 kg/m  General:   Alert,  pleasant and cooperative in NAD Head:  Normocephalic and atraumatic. Neck:  Supple; no masses or thyromegaly. Lungs:  Clear throughout to auscultation.    Heart:  Regular rate and rhythm. Abdomen:  Soft, nontender and nondistended. Normal bowel sounds, without guarding, and without rebound.   Neurologic:  Alert and  oriented x4;  grossly normal  neurologically.  Impression/Plan: KARIYA AUTON is here for an colonoscopy to be performed for family history of colon polyps  Risks, benefits, limitations, and alternatives regarding  colonoscopy have been reviewed with the patient.  Questions have been answered.  All parties agreeable.   Midge Minium, MD  11/19/2023, 9:47 AM

## 2023-11-19 NOTE — Transfer of Care (Signed)
Immediate Anesthesia Transfer of Care Note  Patient: Melinda Holder  Procedure(s) Performed: COLONOSCOPY WITH PROPOFOL with polypectomy  Patient Location: PACU  Anesthesia Type: General  Level of Consciousness: awake, alert  and patient cooperative  Airway and Oxygen Therapy: Patient Spontanous Breathing and Patient connected to supplemental oxygen  Post-op Assessment: Post-op Vital signs reviewed, Patient's Cardiovascular Status Stable, Respiratory Function Stable, Patent Airway and No signs of Nausea or vomiting  Post-op Vital Signs: Reviewed and stable  Complications: No notable events documented.

## 2023-11-19 NOTE — Op Note (Signed)
[facility] Gastroenterology Patient Name: Melinda Holder Procedure Date: 11/19/2023 10:17 AM MRN: [id number] Account #: [id number] Date of Birth: 1971-09-01 Admit Type: Outpatient Age: 52 Room: [facility] OR ROOM 01 Gender: Female Note Status: Finalized Instrument Name: [id number] Procedure:             Colonoscopy Indications:           High risk colon cancer surveillance: Personal history                         of colonic polyps Providers:             Midge Minium MD, MD Referring MD:          Yehuda Mao. Birdie Sons (Referring MD) Medicines:             Propofol per Anesthesia Complications:         No immediate complications. Procedure:             Pre-Anesthesia Assessment:                        - Prior to the procedure, a History and Physical was                         performed, and patient medications and allergies were                         reviewed. The patient's tolerance of previous                         anesthesia was also reviewed. The risks and benefits                         of the procedure and the sedation options and risks                         were discussed with the patient. All questions were                         answered, and informed consent was obtained. Prior                         Anticoagulants: The patient has taken no anticoagulant                         or antiplatelet agents. ASA Grade Assessment: II - A                         patient with mild systemic disease. After reviewing                         the risks and benefits, the patient was deemed in                         satisfactory condition to undergo the procedure.                        After obtaining informed consent, the colonoscope was  passed under direct vision. Throughout the procedure,                         the patient's blood pressure, pulse, and oxygen                         saturations were monitored continuously. The                          Colonoscope was introduced through the anus and                         advanced to the the cecum, identified by appendiceal                         orifice and ileocecal valve. The colonoscopy was                         performed without difficulty. The patient tolerated                         the procedure well. The quality of the bowel                         preparation was excellent. Findings:      The perianal and digital rectal examinations were normal.      A 5 mm polyp was found in the sigmoid colon. The polyp was sessile. The       polyp was removed with a cold snare. Resection and retrieval were       complete.      A 2 mm polyp was found in the sigmoid colon. The polyp was sessile. The       polyp was removed with a cold biopsy forceps. Resection and retrieval       were complete.      Thickened fold biopied with a cold biopsy      A tattoo was seen in the sigmoid colon.      Non-bleeding internal hemorrhoids were found during retroflexion. The       hemorrhoids were Grade I (internal hemorrhoids that do not prolapse). Impression:            - One 5 mm polyp in the sigmoid colon, removed with a                         cold snare. Resected and retrieved.                        - One 2 mm polyp in the sigmoid colon, removed with a                         cold biopsy forceps. Resected and retrieved.                        - Thickened fold biopied with a cold biopsy                        - A tattoo was seen in the sigmoid colon.                        -  Non-bleeding internal hemorrhoids. Recommendation:        - Discharge patient to home.                        - Resume previous diet.                        - Continue present medications.                        - Await pathology results.                        - If the pathology report reveals adenomatous tissue,                         then repeat the colonoscopy for surveillance in 7                          years. Procedure Code(s):     --- Professional ---                        [id number], Colonoscopy, flexible; with removal of                         tumor(s), polyp(s), or other lesion(s) by snare                         technique                        [id number], 59, Colonoscopy, flexible; with biopsy, single                         or multiple Diagnosis Code(s):     --- Professional ---                        Z86.010, Personal history of colonic polyps                        D12.5, Benign neoplasm of sigmoid colon CPT copyright 2022 American Medical Association. All rights reserved. The codes documented in this report are preliminary and upon coder review may  be revised to meet current compliance requirements. Midge Minium MD, MD 11/19/2023 10:48:01 AM This report has been signed electronically. Number of Addenda: 0 Note Initiated On: 11/19/2023 10:17 AM Total Procedure Duration: 0 hours 17 minutes 35 seconds  Estimated Blood Loss:  Estimated blood loss: none.      [facility]

## 2023-11-19 NOTE — Anesthesia Postprocedure Evaluation (Signed)
Anesthesia Post Note  Patient: Melinda Holder  Procedure(s) Performed: COLONOSCOPY WITH PROPOFOL with polypectomy  Patient location during evaluation: PACU Anesthesia Type: General Level of consciousness: awake and alert Pain management: pain level controlled Vital Signs Assessment: post-procedure vital signs reviewed and stable Respiratory status: spontaneous breathing, nonlabored ventilation, respiratory function stable and patient connected to nasal cannula oxygen Cardiovascular status: blood pressure returned to baseline and stable Postop Assessment: no apparent nausea or vomiting Anesthetic complications: no   No notable events documented.   Last Vitals:  Vitals:   11/19/23 1057 11/19/23 1100  BP: 101/66   Pulse: 90 81  Resp: 14 17  Temp:    SpO2: 99% 100%    Last Pain:  Vitals:   11/19/23 1057  TempSrc:   PainSc: 0-No pain                 Kylan Liberati C Sophina Mitten

## 2023-11-20 ENCOUNTER — Encounter: Payer: Self-pay | Admitting: Gastroenterology

## 2023-11-22 ENCOUNTER — Encounter: Payer: Self-pay | Admitting: Family Medicine

## 2023-11-22 ENCOUNTER — Ambulatory Visit (INDEPENDENT_AMBULATORY_CARE_PROVIDER_SITE_OTHER): Payer: BC Managed Care – PPO | Admitting: Family Medicine

## 2023-11-22 VITALS — BP 126/80 | HR 87 | Temp 98.2°F | Resp 17 | Ht 62.0 in | Wt 152.4 lb

## 2023-11-22 DIAGNOSIS — D509 Iron deficiency anemia, unspecified: Secondary | ICD-10-CM | POA: Diagnosis not present

## 2023-11-22 DIAGNOSIS — Z1231 Encounter for screening mammogram for malignant neoplasm of breast: Secondary | ICD-10-CM

## 2023-11-22 DIAGNOSIS — R7303 Prediabetes: Secondary | ICD-10-CM

## 2023-11-22 DIAGNOSIS — Z13220 Encounter for screening for lipoid disorders: Secondary | ICD-10-CM

## 2023-11-22 DIAGNOSIS — Z0000 Encounter for general adult medical examination without abnormal findings: Secondary | ICD-10-CM

## 2023-11-22 LAB — CBC WITH DIFFERENTIAL/PLATELET
Basophils Absolute: 0 10*3/uL (ref 0.0–0.1)
Basophils Relative: 0.7 % (ref 0.0–3.0)
Eosinophils Absolute: 0.2 10*3/uL (ref 0.0–0.7)
Eosinophils Relative: 4.3 % (ref 0.0–5.0)
HCT: 41 % (ref 36.0–46.0)
Hemoglobin: 13.7 g/dL (ref 12.0–15.0)
Lymphocytes Relative: 31.5 % (ref 12.0–46.0)
Lymphs Abs: 1.7 10*3/uL (ref 0.7–4.0)
MCHC: 33.4 g/dL (ref 30.0–36.0)
MCV: 94.5 fL (ref 78.0–100.0)
Monocytes Absolute: 0.4 10*3/uL (ref 0.1–1.0)
Monocytes Relative: 7.6 % (ref 3.0–12.0)
Neutro Abs: 2.9 10*3/uL (ref 1.4–7.7)
Neutrophils Relative %: 55.9 % (ref 43.0–77.0)
Platelets: 423 10*3/uL — ABNORMAL HIGH (ref 150.0–400.0)
RBC: 4.34 Mil/uL (ref 3.87–5.11)
RDW: 13.4 % (ref 11.5–15.5)
WBC: 5.3 10*3/uL (ref 4.0–10.5)

## 2023-11-22 LAB — COMPREHENSIVE METABOLIC PANEL
ALT: 13 U/L (ref 0–35)
AST: 19 U/L (ref 0–37)
Albumin: 4.3 g/dL (ref 3.5–5.2)
Alkaline Phosphatase: 48 U/L (ref 39–117)
BUN: 14 mg/dL (ref 6–23)
CO2: 24 meq/L (ref 19–32)
Calcium: 9.3 mg/dL (ref 8.4–10.5)
Chloride: 105 meq/L (ref 96–112)
Creatinine, Ser: 0.63 mg/dL (ref 0.40–1.20)
GFR: 102.6 mL/min (ref >60.00)
Glucose, Bld: 99 mg/dL (ref 70–99)
Potassium: 3.8 meq/L (ref 3.5–5.1)
Sodium: 139 meq/L (ref 135–145)
Total Bilirubin: 0.4 mg/dL (ref 0.2–1.2)
Total Protein: 7.1 g/dL (ref 6.0–8.3)

## 2023-11-22 LAB — LIPID PANEL
Cholesterol: 181 mg/dL (ref 0–200)
HDL: 43.5 mg/dL (ref >39.00)
LDL Cholesterol: 110 mg/dL — ABNORMAL HIGH (ref 0–99)
NonHDL: 137.61
Total CHOL/HDL Ratio: 4
Triglycerides: 137 mg/dL (ref 0.0–149.0)
VLDL: 27.4 mg/dL (ref 0.0–40.0)

## 2023-11-22 LAB — IBC + FERRITIN
Ferritin: 50.8 ng/mL (ref 10.0–291.0)
Iron: 81 ug/dL (ref 42–145)
Saturation Ratios: 23.4 % (ref 20.0–50.0)
TIBC: 345.8 ug/dL (ref 250.0–450.0)
Transferrin: 247 mg/dL (ref 212.0–360.0)

## 2023-11-22 LAB — HEMOGLOBIN A1C: Hgb A1c MFr Bld: 5.9 % (ref 4.6–6.5)

## 2023-11-22 MED ORDER — HYDROXYZINE HCL 10 MG PO TABS: ORAL_TABLET | ORAL | 2 refills | Status: DC

## 2023-11-22 NOTE — Patient Instructions (Signed)
Nice to see you. Get lab work today. Please call [phone] to schedule your mammogram.

## 2023-11-22 NOTE — Assessment & Plan Note (Signed)
Physical exam completed.  Encouraged continued healthy diet and exercise.  Discussed if she could increase her exercise and reduce her sweet tea intake that would be great.  Cancer screening is up-to-date.  Mammogram ordered.  Patient was encouraged to get the Shingrix vaccine.  She will consider this.  Declines today.  Declines further COVID vaccines.  Encouraged to limit alcohol intake to no more than 3 beverages at a time given risk of binge drinking.  Lab work as outlined.

## 2023-11-22 NOTE — Progress Notes (Signed)
Marikay Alar, MD Phone: [phone]  Melinda Holder is a 52 y.o. female who presents today for CPE.  Diet: Generally healthy, eats meats and vegetables, not many sweets, not many fried foods, does have half a cup of sweet tea daily Exercise: Walks 3-4 times a week for about 40 minutes Pap smear: 09/28/2019 negative HPV, NILM, is seeing gynecology in the next week or so. Colonoscopy: 01/18/2023, still waiting on pathology Mammogram: 01/06/2023, negative, has personal history of breast cancer, reports being released by her surgeon.  Reports having had genetic testing.  Recent breast exam through gynecology. Family history-  Colon cancer: no  Breast cancer: Paternal aunt x 2  Ovarian cancer: Paternal aunt Menses: Postmenopausal Vaccines-   Flu: Up-to-date  Tetanus: Up-to-date  Shingles: Due  COVID19: X 2 HIV screening: Up-to-date Hep C Screening: Up-to-date Tobacco use: Former smoker Alcohol use: At most 1 time a week with 4-5 beers Illicit Drug use: No Dentist: Yes Ophthalmology: Yes   Active Ambulatory Problems    Diagnosis Date Noted   Overweight (BMI 25.0-29.9) 09/03/2015   Routine general medical examination at a health care facility 02/27/2016   Iron deficiency anemia 05/20/2017   Malignant neoplasm of left female breast (HCC) 11/03/2018   Malignant neoplasm of central portion of left breast [facility])    Need for prophylactic vaccination and inoculation against influenza 09/20/2019   Chronic headaches 11/14/2020   Breast lesion 12/13/2020   Prediabetes 11/16/2022   Atypical chest pain 11/18/2022   Hot flashes due to menopause 08/02/2023   LFTs abnormal 08/02/2023   Microscopic hematuria 08/02/2023   Hematochezia 08/02/2023   Positive self-administered antigen test for COVID-19 09/14/2023   Hx of colonic polyps 11/19/2023   Polyp of sigmoid colon 11/19/2023   Resolved Ambulatory Problems    Diagnosis Date Noted   Encounter to establish care 09/03/2015   Pruritus  09/03/2015   Need for prophylactic vaccination with combined diphtheria-tetanus-pertussis (DTP) vaccine 09/03/2015   Encounter for immunization 09/03/2015   Need for Tdap vaccination 09/03/2015   Adnexal pain 11/18/2012   Ankle swelling, right 05/20/2017   Constipation 05/20/2017   Enteritis 06/30/2017   Malrotation of intestine    Elevated glucose 06/07/2018   Acute gastritis without hemorrhage    Acute duodenitis    Other specified diseases of intestine    Goals of care, counseling/discussion 01/05/2019   Bruising 11/14/2020   Past Medical History:  Diagnosis Date   Amblyopia of eye, right    Anemia    Asthma    Breast cancer (HCC) 2019   Cancer (HCC) 10/27/2018   Difficult intubation 11/03/2016   Endometriosis    GERD (gastroesophageal reflux disease)    Migraine    Personal history of radiation therapy     Family History  Problem Relation Age of Onset   Lupus Mother    Hypertension Mother    Lymphoma Mother 42       B cell; currently 2   Hypertension Father        currently 71   Diabetes Father    Breast cancer Paternal Aunt 7       deceased 108   Lung cancer Paternal Aunt 37       deceased 46   Brain cancer Paternal Grandfather 81       deceased 75    Social History   Socioeconomic History   Marital status: Married    Spouse name: 0   Number of children: 0   Years of education:  Not on file   Highest education level: Associate degree: academic program  Occupational History   Occupation: supervisor    Comment: [employer]  Tobacco Use   Smoking status: Former    Current packs/day: 0.00    Average packs/day: 1 pack/day for 15.0 years (15.0 ttl pk-yrs)    Types: Cigarettes    Start date: 02/22/2000    Quit date: 02/21/2015    Years since quitting: 8.7   Smokeless tobacco: Never  Vaping Use   Vaping status: Former   Quit date: 01/01/2019  Substance and Sexual Activity   Alcohol use: Yes    Alcohol/week: 2.0 standard drinks of alcohol     Types: 2 Cans of beer per week    Comment: Occasional    Drug use: No   Sexual activity: Yes    Partners: Male    Comment: 1 female  Other Topics Concern   Not on file  Social History Narrative   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 CV:  Negative for chest pain, dyspnea, edema, palpitations Resp: Negative for cough, dyspnea, hemoptysis GI: Negative for nausea, vomiting, diarrhea, constipation, abdominal pain, melena, hematochezia. GU: Negative for dysuria, incontinence, urinary hesitance, hematuria, vaginal or penile discharge, polyuria, sexual difficulty, lumps in testicle or breasts MSK: Negative for muscle cramps or aches, joint pain or swelling Neuro: Negative for headaches, weakness, numbness, dizziness, passing out/fainting Psych: Negative for depression, anxiety, memory problems  Objective  Physical Exam Vitals:   11/22/23 0837  BP: 126/80  Pulse: 87  Resp: 17  Temp: 98.2 F (36.8 C)  SpO2: 95%    BP Readings from Last 3 Encounters:  11/22/23 126/80  11/19/23 101/66  10/25/23 (!) 146/85   Wt Readings from Last 3 Encounters:  11/22/23 152 lb 6 oz (69.1 kg)  11/19/23 145 lb (65.8 kg)  10/25/23 147 lb (66.7 kg)    Physical Exam Constitutional:      General: She is not in acute distress.    Appearance: She is not diaphoretic.  HENT:      Head: Normocephalic and atraumatic.  Cardiovascular:     Rate and Rhythm: Normal rate and regular rhythm.     Heart sounds: Normal heart sounds.  Pulmonary:     Effort: Pulmonary effort is normal.     Breath sounds: Normal breath sounds.  Abdominal:     General: Bowel sounds are normal. There is no distension.     Palpations: Abdomen is soft.     Tenderness: There is no abdominal tenderness.  Musculoskeletal:     Right lower leg: No edema.     Left lower leg: No edema.  Lymphadenopathy:     Cervical: No cervical adenopathy.  Skin:    General: Skin is warm and dry.  Neurological:  Mental Status: She is alert.  Psychiatric:        Mood and Affect: Mood normal.      Assessment/Plan:   Routine general medical examination at a health care facility Assessment & Plan: Physical exam completed.  Encouraged continued healthy diet and exercise.  Discussed if she could increase her exercise and reduce her sweet tea intake that would be great.  Cancer screening is up-to-date.  Mammogram ordered.  Patient was encouraged to get the Shingrix vaccine.  She will consider this.  Declines today.  Declines further COVID vaccines.  Encouraged to limit alcohol intake to no more than 3 beverages at a time given risk of binge drinking.  Lab work as outlined.   Lipid screening -     Comprehensive metabolic panel -     Lipid panel  Iron deficiency anemia, unspecified iron deficiency anemia type -     CBC with Differential/Platelet -     IBC + Ferritin  Prediabetes -     Hemoglobin A1c  Encounter for screening mammogram for malignant neoplasm of breast -     3D Screening Mammogram, Left and Right; Future  Other orders -     hydrOXYzine HCl; TAKE 1 TABLET BY MOUTH EVERYDAY AT BEDTIME  Dispense: 30 tablet; Refill: 2    Return in about 1 year (around 11/21/2024) for physical.   Marikay Alar, MD [facility] - [facility]

## 2023-11-23 ENCOUNTER — Encounter: Payer: Self-pay | Admitting: Gastroenterology

## 2023-11-23 DIAGNOSIS — D269 Other benign neoplasm of uterus, unspecified: Secondary | ICD-10-CM | POA: Diagnosis not present

## 2023-11-23 DIAGNOSIS — D259 Leiomyoma of uterus, unspecified: Secondary | ICD-10-CM | POA: Diagnosis not present

## 2023-11-23 DIAGNOSIS — N852 Hypertrophy of uterus: Secondary | ICD-10-CM | POA: Diagnosis not present

## 2023-11-23 LAB — SURGICAL PATHOLOGY

## 2023-12-30 ENCOUNTER — Encounter: Payer: Self-pay | Admitting: Oncology

## 2023-12-30 NOTE — Telephone Encounter (Signed)
 Error

## 2024-01-10 ENCOUNTER — Ambulatory Visit
Admission: RE | Admit: 2024-01-10 | Discharge: 2024-01-10 | Disposition: A | Discharge status: Home / Self Care | Payer: BC Managed Care – PPO | Source: Ambulatory Visit | Attending: Family Medicine | Admitting: Family Medicine

## 2024-01-10 ENCOUNTER — Ambulatory Visit: Payer: BC Managed Care – PPO

## 2024-01-10 DIAGNOSIS — Z1231 Encounter for screening mammogram for malignant neoplasm of breast: Secondary | ICD-10-CM | POA: Diagnosis not present

## 2024-01-11 ENCOUNTER — Other Ambulatory Visit: Payer: Self-pay | Admitting: Family Medicine

## 2024-01-11 DIAGNOSIS — R928 Other abnormal and inconclusive findings on diagnostic imaging of breast: Secondary | ICD-10-CM

## 2024-01-18 ENCOUNTER — Ambulatory Visit
Admission: RE | Admit: 2024-01-18 | Discharge: 2024-01-18 | Disposition: A | Discharge status: Home / Self Care | Payer: BC Managed Care – PPO | Source: Ambulatory Visit | Attending: Family Medicine | Admitting: Family Medicine

## 2024-01-18 ENCOUNTER — Other Ambulatory Visit: Payer: Self-pay | Admitting: Family Medicine

## 2024-01-18 DIAGNOSIS — R928 Other abnormal and inconclusive findings on diagnostic imaging of breast: Secondary | ICD-10-CM

## 2024-01-18 DIAGNOSIS — R599 Enlarged lymph nodes, unspecified: Secondary | ICD-10-CM

## 2024-01-18 DIAGNOSIS — R590 Localized enlarged lymph nodes: Secondary | ICD-10-CM | POA: Diagnosis not present

## 2024-01-18 DIAGNOSIS — R92343 Mammographic extreme density, bilateral breasts: Secondary | ICD-10-CM | POA: Diagnosis not present

## 2024-01-20 ENCOUNTER — Other Ambulatory Visit: Payer: BC Managed Care – PPO

## 2024-01-20 ENCOUNTER — Ambulatory Visit
Admission: RE | Admit: 2024-01-20 | Discharge: 2024-01-20 | Disposition: A | Discharge status: Home / Self Care | Payer: BC Managed Care – PPO | Source: Ambulatory Visit | Attending: Family Medicine | Admitting: Family Medicine

## 2024-01-20 DIAGNOSIS — R928 Other abnormal and inconclusive findings on diagnostic imaging of breast: Secondary | ICD-10-CM | POA: Diagnosis not present

## 2024-01-20 DIAGNOSIS — R599 Enlarged lymph nodes, unspecified: Secondary | ICD-10-CM

## 2024-01-20 DIAGNOSIS — R590 Localized enlarged lymph nodes: Secondary | ICD-10-CM | POA: Diagnosis not present

## 2024-01-20 DIAGNOSIS — R591 Generalized enlarged lymph nodes: Secondary | ICD-10-CM | POA: Diagnosis not present

## 2024-01-20 MED ORDER — LIDOCAINE-EPINEPHRINE 2 %-1:100000 IJ SOLN
8.0000 mL | Freq: Once | INTRAMUSCULAR | Status: AC
  Administered 2024-01-20: 8 mL
  Filled 2024-01-20: qty 8.5

## 2024-01-20 MED ORDER — LIDOCAINE 1 % OPTIME INJ - NO CHARGE
2.0000 mL | Freq: Once | INTRAMUSCULAR | Status: AC
  Administered 2024-01-20: 2 mL
  Filled 2024-01-20: qty 2

## 2024-01-25 LAB — SURGICAL PATHOLOGY

## 2024-01-28 ENCOUNTER — Ambulatory Visit (INDEPENDENT_AMBULATORY_CARE_PROVIDER_SITE_OTHER): Payer: BC Managed Care – PPO | Admitting: Nurse Practitioner

## 2024-01-28 ENCOUNTER — Encounter: Payer: Self-pay | Admitting: Nurse Practitioner

## 2024-01-28 VITALS — BP 122/82 | HR 75 | Temp 98.2°F | Ht 62.0 in | Wt 150.4 lb

## 2024-01-28 DIAGNOSIS — R7303 Prediabetes: Secondary | ICD-10-CM | POA: Diagnosis not present

## 2024-01-28 DIAGNOSIS — R590 Localized enlarged lymph nodes: Secondary | ICD-10-CM

## 2024-01-28 DIAGNOSIS — D509 Iron deficiency anemia, unspecified: Secondary | ICD-10-CM | POA: Diagnosis not present

## 2024-01-28 DIAGNOSIS — R7989 Other specified abnormal findings of blood chemistry: Secondary | ICD-10-CM

## 2024-01-28 DIAGNOSIS — D259 Leiomyoma of uterus, unspecified: Secondary | ICD-10-CM

## 2024-01-28 LAB — CBC WITH DIFFERENTIAL/PLATELET
Basophils Absolute: 0 10*3/uL (ref 0.0–0.1)
Basophils Relative: 0.9 % (ref 0.0–3.0)
Eosinophils Absolute: 0.3 10*3/uL (ref 0.0–0.7)
Eosinophils Relative: 6.2 % — ABNORMAL HIGH (ref 0.0–5.0)
HCT: 43 % (ref 36.0–46.0)
Hemoglobin: 14.6 g/dL (ref 12.0–15.0)
Lymphocytes Relative: 28 % (ref 12.0–46.0)
Lymphs Abs: 1.5 10*3/uL (ref 0.7–4.0)
MCHC: 33.8 g/dL (ref 30.0–36.0)
MCV: 94.7 fL (ref 78.0–100.0)
Monocytes Absolute: 0.5 10*3/uL (ref 0.1–1.0)
Monocytes Relative: 9.5 % (ref 3.0–12.0)
Neutro Abs: 2.9 10*3/uL (ref 1.4–7.7)
Neutrophils Relative %: 55.4 % (ref 43.0–77.0)
Platelets: 467 10*3/uL — ABNORMAL HIGH (ref 150.0–400.0)
RBC: 4.55 Mil/uL (ref 3.87–5.11)
RDW: 13 % (ref 11.5–15.5)
WBC: 5.3 10*3/uL (ref 4.0–10.5)

## 2024-01-28 NOTE — Assessment & Plan Note (Signed)
Platelet levels were elevated in November, possibly as a reaction to the recent colonoscopy. Check CBC today.

## 2024-01-28 NOTE — Progress Notes (Signed)
Bethanie Dicker, NP-C Phone: [phone]  Melinda Holder is a 53 y.o. female who presents today for transfer of care.   Discussed the use of AI scribe software for clinical note transcription with the patient, who gave verbal consent to proceed.  History of Present Illness   The patient presents for a transfer of care and follow-up on elevated platelets. She was referred by Dr. Birdie Sons for transfer of care.  The patient has a history of iron deficiency anemia and elevated platelets. She has been evaluated by hematology and gastroenterology, including a normal colonoscopy. They suspect her iron deficiency was related to menstrual cycles, but she has been postmenopausal for two to three years. She is not currently taking oral iron supplements and is no longer under hematology care. Her last blood work in November showed normal iron levels but elevated platelets.  She has an enlarged lymph node in the axillary region, which was biopsied and found to be normal. Her white blood cell count was normal. She has a history of breast cancer and has been released by her surgeon and oncology. She also has fibroids and is followed by OB GYN for this condition.  No smoking and no other significant medical issues or symptoms to report.      Social History   Tobacco Use  Smoking Status Former   Current packs/day: 0.00   Average packs/day: 1 pack/day for 15.0 years (15.0 ttl pk-yrs)   Types: Cigarettes   Start date: 02/22/2000   Quit date: 02/21/2015   Years since quitting: 8.9  Smokeless Tobacco Never    Current Outpatient Medications on File Prior to Visit  Medication Sig Dispense Refill   hydrOXYzine (ATARAX) 10 MG tablet TAKE 1 TABLET BY MOUTH EVERYDAY AT BEDTIME 30 tablet 2   ipratropium (ATROVENT) 0.06 % nasal spray Place 2 sprays into both nostrils 4 (four) times daily. (Patient taking differently: Place 2 sprays into both nostrils 4 (four) times daily. As needed) 15 mL 0   ondansetron  (ZOFRAN-ODT) 8 MG disintegrating tablet Take 1 tablet (8 mg total) by mouth every 8 (eight) hours as needed for nausea or vomiting. 20 tablet 0   polyethylene glycol (MIRALAX / GLYCOLAX) 17 g packet Take 17 g by mouth as needed.     [DISCONTINUED] fluticasone (FLONASE) 50 MCG/ACT nasal spray Place 2 sprays into both nostrils daily. 16 g 0   No current facility-administered medications on file prior to visit.     ROS see history of present illness  Objective  Physical Exam Vitals:   01/28/24 0902  BP: 122/82  Pulse: 75  Temp: 98.2 F (36.8 C)  SpO2: 97%    BP Readings from Last 3 Encounters:  01/28/24 122/82  11/22/23 126/80  11/19/23 101/66   Wt Readings from Last 3 Encounters:  01/28/24 150 lb 6.4 oz (68.2 kg)  11/22/23 152 lb 6 oz (69.1 kg)  11/19/23 145 lb (65.8 kg)    Physical Exam Constitutional:      General: She is not in acute distress.    Appearance: Normal appearance.  HENT:     Head: Normocephalic.  Cardiovascular:     Rate and Rhythm: Normal rate and regular rhythm.     Heart sounds: Normal heart sounds.  Pulmonary:     Effort: Pulmonary effort is normal.     Breath sounds: Normal breath sounds.  Skin:    General: Skin is warm and dry.  Neurological:     General: No focal deficit present.  Mental Status: She is alert.  Psychiatric:        Mood and Affect: Mood normal.        Behavior: Behavior normal.    Assessment/Plan: Please see individual problem list.  Elevated platelet count Assessment & Plan: Platelet levels were elevated in November, possibly as a reaction to the recent colonoscopy. Check CBC today.  Orders: -     CBC with Differential/Platelet  Axillary lymphadenopathy Assessment & Plan: The biopsy was negative, with no signs of infection or inflammation. Monitor for changes.   Iron deficiency anemia, unspecified iron deficiency anemia type Assessment & Plan: There is no active bleeding, recent menses, or abnormalities in  the recent colonoscopy. Hemoglobin and iron levels were normal in November, and there is no current iron supplementation. Monitor blood counts.   Prediabetes Assessment & Plan: A1c in November 5.9. Encouraged healthy diet and routine exercise.    Uterine leiomyoma, unspecified location Assessment & Plan: Managed by OB/GYN with no current issues. Continue routine follow-up.     Return in about 10 months (around 11/22/2024) for Annual Exam, sooner as needed.   Bethanie Dicker, NP-C [place] [facility] - [facility]

## 2024-01-28 NOTE — Assessment & Plan Note (Signed)
Managed by OB/GYN with no current issues. Continue routine follow-up.

## 2024-01-28 NOTE — Assessment & Plan Note (Signed)
A1c in November 5.9. Encouraged healthy diet and routine exercise.

## 2024-01-28 NOTE — Assessment & Plan Note (Signed)
The biopsy was negative, with no signs of infection or inflammation. Monitor for changes.

## 2024-01-28 NOTE — Assessment & Plan Note (Signed)
There is no active bleeding, recent menses, or abnormalities in the recent colonoscopy. Hemoglobin and iron levels were normal in November, and there is no current iron supplementation. Monitor blood counts.

## 2024-02-01 ENCOUNTER — Telehealth: Payer: Self-pay | Admitting: Oncology

## 2024-02-01 NOTE — Telephone Encounter (Signed)
See me next wed or Friday. Labs tbd after I see her

## 2024-02-01 NOTE — Telephone Encounter (Signed)
Patient last seen 4/23- she states she went to PCP and platelets are elevated. They would like her to be seen here. Please advise.   She says she would like an am appointment if possible and ASAP

## 2024-02-11 ENCOUNTER — Inpatient Hospital Stay: Payer: BC Managed Care – PPO

## 2024-02-11 ENCOUNTER — Inpatient Hospital Stay: Payer: BC Managed Care – PPO | Attending: Oncology | Admitting: Oncology

## 2024-02-11 ENCOUNTER — Encounter: Payer: Self-pay | Admitting: Oncology

## 2024-02-11 VITALS — BP 126/83 | HR 43 | Temp 98.0°F | Resp 17 | Wt 154.0 lb

## 2024-02-11 DIAGNOSIS — D509 Iron deficiency anemia, unspecified: Secondary | ICD-10-CM

## 2024-02-11 DIAGNOSIS — D75839 Thrombocytosis, unspecified: Secondary | ICD-10-CM

## 2024-02-11 DIAGNOSIS — Z853 Personal history of malignant neoplasm of breast: Secondary | ICD-10-CM | POA: Diagnosis not present

## 2024-02-11 DIAGNOSIS — Z08 Encounter for follow-up examination after completed treatment for malignant neoplasm: Secondary | ICD-10-CM | POA: Diagnosis not present

## 2024-02-11 LAB — CBC WITH DIFFERENTIAL (CANCER CENTER ONLY)
Abs Immature Granulocytes: 0.01 10*3/uL (ref 0.00–0.07)
Basophils Absolute: 0.1 10*3/uL (ref 0.0–0.1)
Basophils Relative: 1 %
Eosinophils Absolute: 0.2 10*3/uL (ref 0.0–0.5)
Eosinophils Relative: 3 %
HCT: 39.7 % (ref 36.0–46.0)
Hemoglobin: 13.3 g/dL (ref 12.0–15.0)
Immature Granulocytes: 0 %
Lymphocytes Relative: 21 %
Lymphs Abs: 1.7 10*3/uL (ref 0.7–4.0)
MCH: 31.3 pg (ref 26.0–34.0)
MCHC: 33.5 g/dL (ref 30.0–36.0)
MCV: 93.4 fL (ref 80.0–100.0)
Monocytes Absolute: 0.6 10*3/uL (ref 0.1–1.0)
Monocytes Relative: 8 %
Neutro Abs: 5.1 10*3/uL (ref 1.7–7.7)
Neutrophils Relative %: 67 %
Platelet Count: 394 10*3/uL (ref 150–400)
RBC: 4.25 MIL/uL (ref 3.87–5.11)
RDW: 12.8 % (ref 11.5–15.5)
WBC Count: 7.7 10*3/uL (ref 4.0–10.5)
nRBC: 0 % (ref 0.0–0.2)

## 2024-02-11 LAB — IRON AND TIBC
Iron: 74 ug/dL (ref 28–170)
Saturation Ratios: 20 % (ref 10.4–31.8)
TIBC: 364 ug/dL (ref 250–450)
UIBC: 290 ug/dL

## 2024-02-11 LAB — FERRITIN: Ferritin: 23 ng/mL (ref 11–307)

## 2024-02-11 NOTE — Progress Notes (Signed)
Patient here for oncology follow-up appointment, expresses no complaints or concerns at this time.

## 2024-02-12 ENCOUNTER — Encounter: Payer: Self-pay | Admitting: Oncology

## 2024-02-12 NOTE — Progress Notes (Signed)
Hematology/Oncology Consult note [facility]  [phone] [phone]  Patient Care Team: Bethanie Dicker, NP as PCP - General (Nurse Practitioner) Carmina Miller, MD as Radiation Oncologist (Radiation Oncology) Alinda Dooms, NP as Nurse Practitioner (Nurse Practitioner)   Name of the patient: Melinda Holder  [id number]  July 09, 1971   Date of visit: 02/12/24  Diagnosis- history of breast cancer and iron deficiency anemia  Thrombocytosis - intermittent likely reactive  Chief complaint/ Reason for visit- referred for thrombocytosis  Heme/Onc history: Patient is a 53 year old premenopausal woman who sees me for her iron deficiency anemia.  She has received Feraheme in the past last in March 2019 and her hemoglobin had improved.  Patient also has thrombocytosis dating back to 2017.  Jak 2 mutation testing has been negative.  Bone marrow biopsy not done at this time as she is less than 89 years of age with no prior history of thrombosis.   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  OS plus AI was recommended without adjuvant chemotherapy.  Patient could not tolerate Lupron and was therefore switched to tamoxifen.  Patient would also not tolerate tamoxifen due to worsening headaches and stopped taking it.  Currently patient is not taking any antihormonal therapy.     Interval history-feels well overall and denies any complaints at this time  ECOG PS- 0 Pain scale- 0  Review of systems- Review of Systems  Constitutional:  Negative for chills, fever, malaise/fatigue and weight loss.  HENT:  Negative for congestion, ear discharge and nosebleeds.   Eyes:  Negative for blurred vision.  Respiratory:  Negative for cough, hemoptysis, sputum  production, shortness of breath and wheezing.   Cardiovascular:  Negative for chest pain, palpitations, orthopnea and claudication.  Gastrointestinal:  Negative for abdominal pain, blood in stool, constipation, diarrhea, heartburn, melena, nausea and vomiting.  Genitourinary:  Negative for dysuria, flank pain, frequency, hematuria and urgency.  Musculoskeletal:  Negative for back pain, joint pain and myalgias.  Skin:  Negative for rash.  Neurological:  Negative for dizziness, tingling, focal weakness, seizures, weakness and headaches.  Endo/Heme/Allergies:  Does not bruise/bleed easily.  Psychiatric/Behavioral:  Negative for depression and suicidal ideas. The patient does not have insomnia.       No Known Allergies   Past Medical History:  Diagnosis Date   Amblyopia of eye, right    Anemia    in the past   Asthma    childhood   Breast cancer (HCC) 2019   Cancer (HCC) 10/27/2018   left breast   Difficult intubation 11/03/2016   DL x 2 with MAC 3 (CRNA and MD), very anterior. DL x 2 with Glidescope #3 Lo pro, bougie utilized. Easy mask.   Endometriosis    GERD (gastroesophageal reflux disease)    not for a while, tomato products   Iron deficiency anemia    Migraine    2 months ago   Personal history of radiation therapy      Past Surgical History:  Procedure Laterality Date   ABLATION ON ENDOMETRIOSIS     BREAST BIOPSY Left 10/28/2018   INVASIVE MAMMARY CARCINOMA   BREAST EXCISIONAL BIOPSY Left 10/28/2018   BREAST LUMPECTOMY Left 11/17/2018   INVASIVE MAMMARY CARCINOMA   BREAST LUMPECTOMY Left 12/19/2018   re-excision Procedure: BREAST  LUMPECTOMY;  Surgeon: Leafy Ro, MD;  Location: [facility];  Service: General;  Laterality: Left;   BREAST LUMPECTOMY WITH NEEDLE LOCALIZATION Left 11/17/2018   Procedure: BREAST LUMPECTOMY WITH NEEDLE LOCALIZATION;  Surgeon: Leafy Ro, MD;  Location: [facility];  Service: General;  Laterality: Left;   CLAVICLE SURGERY Left 10/2016    COLONOSCOPY WITH PROPOFOL N/A 06/27/2018   Procedure: COLONOSCOPY WITH PROPOFOL;  Surgeon: Midge Minium, MD;  Location: [facility];  Service: Endoscopy;  Laterality: N/A;   COLONOSCOPY WITH PROPOFOL N/A 11/19/2023   Procedure: COLONOSCOPY WITH PROPOFOL with polypectomy;  Surgeon: Midge Minium, MD;  Location: [facility];  Service: Endoscopy;  Laterality: N/A;   ESOPHAGOGASTRODUODENOSCOPY (EGD) WITH PROPOFOL N/A 06/27/2018   Procedure: ESOPHAGOGASTRODUODENOSCOPY (EGD) WITH PROPOFOL;  Surgeon: Midge Minium, MD;  Location: [facility];  Service: Endoscopy;  Laterality: N/A;   EYE MUSCLE SURGERY Right    child   ORIF CLAVICULAR FRACTURE Left 11/03/2016   Procedure: OPEN REDUCTION INTERNAL FIXATION (ORIF) CLAVICULAR FRACTURE;  Surgeon: Christena Flake, MD;  Location: [facility];  Service: Orthopedics;  Laterality: Left;   removal of fibroid     SENTINEL NODE BIOPSY Left 11/17/2018   Procedure: SENTINEL NODE BIOPSY;  Surgeon: Leafy Ro, MD;  Location: [facility];  Service: General;  Laterality: Left;    Social History   Socioeconomic History   Marital status: Married    Spouse name: 0   Number of children: 0   Years of education: Not on file   Highest education level: Associate degree: academic program  Occupational History   Occupation: Merchandiser, retail    Comment: [employer]  Tobacco Use   Smoking status: Former    Current packs/day: 0.00    Average packs/day: 1 pack/day for 15.0 years (15.0 ttl pk-yrs)    Types: Cigarettes    Start date: 02/22/2000    Quit date: 02/21/2015    Years since quitting: 8.9   Smokeless tobacco: Never  Vaping Use   Vaping status: Former   Quit date: 01/01/2019  Substance and Sexual Activity   Alcohol use: Yes    Alcohol/week: 2.0 standard drinks of alcohol    Types: 2 Cans of beer per week    Comment: Occasional    Drug use: No   Sexual activity: Yes    Partners: Male    Comment: 1 female  Other Topics Concern   Not on file   Social History Narrative   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    Children- 4 step children    Pets: None    Caffeine- Tea 3 cups and Coffee 2 cups    Social Drivers of Corporate investment banker Strain: Not on file  Food Insecurity: Not on file  Transportation Needs: Not on file  Physical Activity: Not on file  Stress: Not on file  Social Connections: Not on file  Intimate Partner Violence: Not on file    Family History  Problem Relation Age of Onset   Lupus Mother    Hypertension Mother    Lymphoma Mother 12       B cell; currently 68   Hypertension Father        currently 19   Diabetes Father    Breast cancer Paternal Aunt 50       deceased 2   Lung cancer Paternal Aunt 65       deceased 41   Brain cancer Paternal Grandfather 27  deceased 65     Current Outpatient Medications:    hydrOXYzine (ATARAX) 10 MG tablet, TAKE 1 TABLET BY MOUTH EVERYDAY AT BEDTIME, Disp: 30 tablet, Rfl: 2   ipratropium (ATROVENT) 0.06 % nasal spray, Place 2 sprays into both nostrils 4 (four) times daily. (Patient taking differently: Place 2 sprays into both nostrils 4 (four) times daily. As needed), Disp: 15 mL, Rfl: 0   ondansetron (ZOFRAN-ODT) 8 MG disintegrating tablet, Take 1 tablet (8 mg total) by mouth every 8 (eight) hours as needed for nausea or vomiting., Disp: 20 tablet, Rfl: 0   polyethylene glycol (MIRALAX / GLYCOLAX) 17 g packet, Take 17 g by mouth as needed., Disp: , Rfl:   Physical exam:  Vitals:   02/11/24 1030  BP: 126/83  Pulse: (!) 43  Resp: 17  Temp: 98 F (36.7 C)  TempSrc: Oral  SpO2: 100%  Weight: 154 lb (69.9 kg)   Physical Exam Cardiovascular:     Rate and Rhythm: Normal rate and regular rhythm.     Heart sounds: Normal heart sounds.  Pulmonary:     Effort: Pulmonary effort is normal.     Breath sounds: Normal breath sounds.  Abdominal:     General: Bowel sounds are normal.     Palpations: Abdomen is soft.  Skin:     General: Skin is warm and dry.  Neurological:     Mental Status: She is alert and oriented to person, place, and time.         Latest Ref Rng & Units 11/22/2023    9:08 AM  CMP  Glucose 70 - 99 mg/dL 99   BUN 6 - 23 mg/dL 14   Creatinine 8.29 - 1.20 mg/dL 5.62   Sodium 130 - 865 mEq/L 139   Potassium 3.5 - 5.1 mEq/L 3.8   Chloride 96 - 112 mEq/L 105   CO2 19 - 32 mEq/L 24   Calcium 8.4 - 10.5 mg/dL 9.3   Total Protein 6.0 - 8.3 g/dL 7.1   Total Bilirubin 0.2 - 1.2 mg/dL 0.4   Alkaline Phos 39 - 117 U/L 48   AST 0 - 37 U/L 19   ALT 0 - 35 U/L 13       Latest Ref Rng & Units 02/11/2024   11:24 AM  CBC  WBC 4.0 - 10.5 K/uL 7.7   Hemoglobin 12.0 - 15.0 g/dL 78.4   Hematocrit 69.6 - 46.0 % 39.7   Platelets 150 - 400 K/uL 394     No images are attached to the encounter.  [place] AXILLARY NODE CORE BIOPSY RIGHT Addendum Date: 01/25/2024 ADDENDUM REPORT: 01/25/2024 15:51 ADDENDUM: PATHOLOGY revealed: 1. Lymph node, needle/core biopsy, right axilla (hydromark coil clip) : - BENIGN/REACTIVE LYMPH NODE WITH FOLLICULAR HYPERPLASIA. Pathology results are CONCORDANT with imaging findings, per Dr. Jacob Moores. Pathology results and recommendations were discussed with patient via telephone on 01/25/2024 by Randa Lynn RN. Patient reported biopsy site doing well with no adverse symptoms, and only slight tenderness at the site. Post biopsy care instructions were reviewed, questions were answered and my direct phone number was provided. Patient was instructed to call [facility] for any additional questions or concerns related to biopsy site. RECOMMENDATION: Patient instructed to resume annual bilateral screening mammogram due January 2026. Pathology results reported by Randa Lynn RN on 01/25/2024. Electronically Signed   By: Jacob Moores M.D.   On: 01/25/2024 15:51   Result Date: 01/25/2024 CLINICAL DATA:  53 year old female with incidentally identified  RIGHT axillary lymphadenopathy.  History of LEFT breast cancer. EXAM: [place] AXILLARY NODE CORE BIOPSY RIGHT COMPARISON:  Previous exam(s). PROCEDURE: I met with the patient and we discussed the procedure of ultrasound-guided biopsy, including benefits and alternatives. We discussed the high likelihood of a successful procedure. We discussed the risks of the procedure, including infection, bleeding, tissue injury, clip migration, and inadequate sampling. Informed written consent was given. The usual time-out protocol was performed immediately prior to the procedure. Using sterile technique and 1% Lidocaine as local anesthetic, under direct ultrasound visualization, a 14 gauge spring-loaded device was used to perform biopsy of an enlarged right axillary lymph node using an inferior approach. At the conclusion of the procedure, a HydroMARK coil tissue marker clip was deployed into the biopsy cavity. There was clear visualization of the clip under ultrasound guidance and therefore a post-procedure mammogram was not performed. IMPRESSION: Ultrasound guided biopsy of an enlarged right axillary lymph node. No apparent complications. Electronically Signed: By: Jacob Moores M.D. On: 01/20/2024 11:40   MM 3D DIAGNOSTIC MAMMOGRAM UNILATERAL RIGHT BREAST Result Date: 01/18/2024 CLINICAL DATA:  RIGHT breast imaging callback on MLO view only. History of LEFT breast cancer EXAM: DIGITAL DIAGNOSTIC UNILATERAL RIGHT MAMMOGRAM WITH TOMOSYNTHESIS AND CAD; ULTRASOUND RIGHT BREAST LIMITED TECHNIQUE: Right digital diagnostic mammography and breast tomosynthesis was performed. The images were evaluated with computer-aided detection. ; Targeted ultrasound examination of the right breast was performed COMPARISON:  Previous exam(s). ACR Breast Density Category d: The breasts are extremely dense, which lowers the sensitivity of mammography. FINDINGS: The previously described finding does not persist with additional views, consistent with superimposed fibroglandular  tissue. No suspicious mass, microcalcification, or other finding is identified. On physical exam, no suspicious mass is appreciated. Targeted ultrasound was performed of the RIGHT upper outer breast. No suspicious cystic or solid mass is seen at the site of screening mammographic concern. There is incidental sonographic note of enlarged RIGHT axillary lymph nodes which demonstrate preserved echogenic hila and cortices measuring approximately 4 - 6 mm in thickness. This is asymmetric to the contralateral side. Patient denies any history of RIGHT-sided injection or infection. Patient reports elevated platelets of uncertain etiology. IMPRESSION: 1. There is incidental sonographic note of multiple enlarged RIGHT axillary lymph nodes. Recommend ultrasound-guided biopsy for definitive characterization. 2. No mammographic or sonographic evidence of malignancy at the site of screening mammographic concern. RECOMMENDATION: RIGHT axillary ultrasound-guided biopsy x1 Patient has extreme breast density and history of breast cancer. Supplemental screening with breast MRI with and without contrast/abbreviated breast MRI would be recommended. The American Cancer Society recommends annual MRI and mammography in patients with an estimated lifetime risk of developing breast cancer greater than 20 - 25%, or who are known or suspected to be positive for the breast cancer gene. I have discussed the findings and recommendations with the patient. The biopsy procedure was discussed with the patient and questions were answered. Patient expressed their understanding of the biopsy recommendation. Patient will be scheduled for biopsy at her earliest convenience by the schedulers. Ordering provider will be notified. If applicable, a reminder letter will be sent to the patient regarding the next appointment. BI-RADS CATEGORY  4: Suspicious. Electronically Signed   By: Meda Klinefelter M.D.   On: 01/18/2024 11:51   [place] LIMITED ULTRASOUND  INCLUDING AXILLA RIGHT BREAST Result Date: 01/18/2024 CLINICAL DATA:  RIGHT breast imaging callback on MLO view only. History of LEFT breast cancer EXAM: DIGITAL DIAGNOSTIC UNILATERAL RIGHT MAMMOGRAM WITH TOMOSYNTHESIS AND CAD; ULTRASOUND RIGHT BREAST LIMITED  TECHNIQUE: Right digital diagnostic mammography and breast tomosynthesis was performed. The images were evaluated with computer-aided detection. ; Targeted ultrasound examination of the right breast was performed COMPARISON:  Previous exam(s). ACR Breast Density Category d: The breasts are extremely dense, which lowers the sensitivity of mammography. FINDINGS: The previously described finding does not persist with additional views, consistent with superimposed fibroglandular tissue. No suspicious mass, microcalcification, or other finding is identified. On physical exam, no suspicious mass is appreciated. Targeted ultrasound was performed of the RIGHT upper outer breast. No suspicious cystic or solid mass is seen at the site of screening mammographic concern. There is incidental sonographic note of enlarged RIGHT axillary lymph nodes which demonstrate preserved echogenic hila and cortices measuring approximately 4 - 6 mm in thickness. This is asymmetric to the contralateral side. Patient denies any history of RIGHT-sided injection or infection. Patient reports elevated platelets of uncertain etiology. IMPRESSION: 1. There is incidental sonographic note of multiple enlarged RIGHT axillary lymph nodes. Recommend ultrasound-guided biopsy for definitive characterization. 2. No mammographic or sonographic evidence of malignancy at the site of screening mammographic concern. RECOMMENDATION: RIGHT axillary ultrasound-guided biopsy x1 Patient has extreme breast density and history of breast cancer. Supplemental screening with breast MRI with and without contrast/abbreviated breast MRI would be recommended. The American Cancer Society recommends annual MRI and  mammography in patients with an estimated lifetime risk of developing breast cancer greater than 20 - 25%, or who are known or suspected to be positive for the breast cancer gene. I have discussed the findings and recommendations with the patient. The biopsy procedure was discussed with the patient and questions were answered. Patient expressed their understanding of the biopsy recommendation. Patient will be scheduled for biopsy at her earliest convenience by the schedulers. Ordering provider will be notified. If applicable, a reminder letter will be sent to the patient regarding the next appointment. BI-RADS CATEGORY  4: Suspicious. Electronically Signed   By: Meda Klinefelter M.D.   On: 01/18/2024 11:51     Assessment and plan- Patient is a 53 y.o. female with history of stage I left breast cancer now referred for thrombocytosis  Stage I left breast cancer: Status postlumpectomy and adjuvant radiation therapy.  She did not require adjuvant chemotherapy and did not tolerate endocrine therapy.  She is now more than 5 years out of her diagnosis of breast cancer and can continue to follow-up with her primary care provider for yearly breast exams and mammograms  Thrombocytosis: Likely reactive.  This is intermittent with no clear rising trend.  No personal history of thrombosis.  JAK2 mutation testing in the past was negative.  I am getting BCR-ABL FISH, JAK2 with reflex to CALR and MPL testing as well as ferritin and iron studies today.  I will see her back in 2 weeks for virtual visit   Visit Diagnosis 1. Thrombocytosis   2. Iron deficiency anemia, unspecified iron deficiency anemia type      Dr. Owens Shark, MD, MPH [facility] [phone] 02/12/2024 8:10 AM

## 2024-02-16 LAB — BCR-ABL1 FISH
Cells Analyzed: 200
Cells Counted: 200

## 2024-02-16 LAB — JAK2 V617F RFX CALR/MPL/E12-15

## 2024-02-16 LAB — CALR +MPL + E12-E15  (REFLEX)

## 2024-02-24 ENCOUNTER — Inpatient Hospital Stay: Payer: BC Managed Care – PPO | Admitting: Oncology

## 2024-02-24 DIAGNOSIS — D75839 Thrombocytosis, unspecified: Secondary | ICD-10-CM

## 2024-02-24 DIAGNOSIS — Z8639 Personal history of other endocrine, nutritional and metabolic disease: Secondary | ICD-10-CM | POA: Diagnosis not present

## 2024-02-24 NOTE — Progress Notes (Unsigned)
 Called patient to check her in for her video visit, patient said she rather go through the check in process online at this time.

## 2024-02-25 ENCOUNTER — Encounter: Payer: Self-pay | Admitting: Oncology

## 2024-02-25 NOTE — Progress Notes (Signed)
 I connected with Melinda Holder on 02/25/24 at 10:00 AM EST by video enabled telemedicine visit and verified that I am speaking with the correct person using two identifiers.   I discussed the limitations, risks, security and privacy concerns of performing an evaluation and management service by telemedicine and the availability of in-person appointments. I also discussed with the patient that there Holder be a patient responsible charge related to this service. The patient expressed understanding and agreed to proceed.  Other persons participating in the visit and their role in the encounter:  none  Patient's location:  home Provider's location:  home  Chief Complaint: Discuss results of blood work  History of present illness:  Patient is a 53 year old premenopausal woman who sees me for her iron deficiency anemia.  She has received Feraheme in the past last in March 2019 and her hemoglobin had improved.  Patient also has thrombocytosis dating back to 2017.  Jak 2 mutation testing has been negative.  Bone marrow biopsy not done at this time as she is less than 51 years of age with no prior history of thrombosis.   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  OS plus AI was recommended without adjuvant chemotherapy.  Patient could not tolerate Lupron and was therefore switched to tamoxifen.  Patient would also not tolerate tamoxifen due to worsening headaches and stopped taking it.  Currently patient is not taking any antihormonal therapy and is 5 years out from the diagnosis of her breast cancer.  Patient has intermittent thrombocytosis and was rereferred for the same issue..  Labs from 02/11/2024 showed a normal platelet count of 394.  White count and hemoglobin were normal.  JAK2, CALR, MPL, exon  12 mutation testing negative.  BCR-ABL FISH testing unremarkable.  Ferritin levels were low at 23 and iron studies were normal  Interval history patient presently feels well and denies any specific complaints at this time   Review of Systems  Constitutional:  Negative for chills, fever, malaise/fatigue and weight loss.  HENT:  Negative for congestion, ear discharge and nosebleeds.   Eyes:  Negative for blurred vision.  Respiratory:  Negative for cough, hemoptysis, sputum production, shortness of breath and wheezing.   Cardiovascular:  Negative for chest pain, palpitations, orthopnea and claudication.  Gastrointestinal:  Negative for abdominal pain, blood in stool, constipation, diarrhea, heartburn, melena, nausea and vomiting.  Genitourinary:  Negative for dysuria, flank pain, frequency, hematuria and urgency.  Musculoskeletal:  Negative for back pain, joint pain and myalgias.  Skin:  Negative for rash.  Neurological:  Negative for dizziness, tingling, focal weakness, seizures, weakness and headaches.  Endo/Heme/Allergies:  Does not bruise/bleed easily.  Psychiatric/Behavioral:  Negative for depression and suicidal ideas. The patient does not have insomnia.     No Known Allergies  Past Medical History:  Diagnosis Date   Amblyopia of eye, right    Anemia    in the past   Asthma    childhood   Breast cancer (HCC) 2019   Cancer (HCC) 10/27/2018   left breast   Difficult intubation 11/03/2016   DL x 2 with MAC 3 (CRNA and MD), very anterior. DL x 2 with Glidescope #3 Lo pro, bougie utilized. Easy mask.   Endometriosis    GERD (gastroesophageal reflux disease)    not for a  while, tomato products   Iron deficiency anemia    Migraine    2 months ago   Personal history of radiation therapy     Past Surgical History:  Procedure Laterality Date   ABLATION ON ENDOMETRIOSIS     BREAST BIOPSY Left 10/28/2018   INVASIVE MAMMARY CARCINOMA   BREAST EXCISIONAL BIOPSY Left 10/28/2018    BREAST LUMPECTOMY Left 11/17/2018   INVASIVE MAMMARY CARCINOMA   BREAST LUMPECTOMY Left 12/19/2018   re-excision Procedure: BREAST LUMPECTOMY;  Surgeon: Leafy Ro, MD;  Location: [facility];  Service: General;  Laterality: Left;   BREAST LUMPECTOMY WITH NEEDLE LOCALIZATION Left 11/17/2018   Procedure: BREAST LUMPECTOMY WITH NEEDLE LOCALIZATION;  Surgeon: Leafy Ro, MD;  Location: [facility];  Service: General;  Laterality: Left;   CLAVICLE SURGERY Left 10/2016   COLONOSCOPY WITH PROPOFOL N/A 06/27/2018   Procedure: COLONOSCOPY WITH PROPOFOL;  Surgeon: Midge Minium, MD;  Location: [facility];  Service: Endoscopy;  Laterality: N/A;   COLONOSCOPY WITH PROPOFOL N/A 11/19/2023   Procedure: COLONOSCOPY WITH PROPOFOL with polypectomy;  Surgeon: Midge Minium, MD;  Location: [facility];  Service: Endoscopy;  Laterality: N/A;   ESOPHAGOGASTRODUODENOSCOPY (EGD) WITH PROPOFOL N/A 06/27/2018   Procedure: ESOPHAGOGASTRODUODENOSCOPY (EGD) WITH PROPOFOL;  Surgeon: Midge Minium, MD;  Location: [facility];  Service: Endoscopy;  Laterality: N/A;   EYE MUSCLE SURGERY Right    child   ORIF CLAVICULAR FRACTURE Left 11/03/2016   Procedure: OPEN REDUCTION INTERNAL FIXATION (ORIF) CLAVICULAR FRACTURE;  Surgeon: Christena Flake, MD;  Location: [facility];  Service: Orthopedics;  Laterality: Left;   removal of fibroid     SENTINEL NODE BIOPSY Left 11/17/2018   Procedure: SENTINEL NODE BIOPSY;  Surgeon: Leafy Ro, MD;  Location: [facility];  Service: General;  Laterality: Left;    Social History   Socioeconomic History   Marital status: Married    Spouse name: 0   Number of children: 0   Years of education: Not on file   Highest education level: Associate degree: academic program  Occupational History   Occupation: Merchandiser, retail    Comment: [employer]  Tobacco Use   Smoking status: Former    Current packs/day: 0.00    Average packs/day: 1 pack/day for 15.0 years (15.0 ttl  pk-yrs)    Types: Cigarettes    Start date: 02/22/2000    Quit date: 02/21/2015    Years since quitting: 9.0   Smokeless tobacco: Never  Vaping Use   Vaping status: Former   Quit date: 01/01/2019  Substance and Sexual Activity   Alcohol use: Yes    Alcohol/week: 2.0 standard drinks of alcohol    Types: 2 Cans of beer per week    Comment: Occasional    Drug use: No   Sexual activity: Yes    Partners: Male    Comment: 1 female  Other Topics Concern   Not on file  Social History Narrative   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    Children- 4 step children    Pets: None    Caffeine- Tea 3 cups and Coffee 2 cups    Social Drivers of Corporate investment banker Strain: Not on file  Food Insecurity: Not on file  Transportation Needs: Not on file  Physical Activity: Not on file  Stress: Not on file  Social Connections: Not on file  Intimate Partner Violence: Not on file    Family History  Problem Relation  Age of Onset   Lupus Mother    Hypertension Mother    Lymphoma Mother 56       B cell; currently 10   Hypertension Father        currently 60   Diabetes Father    Breast cancer Paternal Aunt 12       deceased 68   Lung cancer Paternal Aunt 18       deceased 7   Brain cancer Paternal Grandfather 57       deceased 76     Current Outpatient Medications:    hydrOXYzine (ATARAX) 10 MG tablet, TAKE 1 TABLET BY MOUTH EVERYDAY AT BEDTIME, Disp: 30 tablet, Rfl: 2   ipratropium (ATROVENT) 0.06 % nasal spray, Place 2 sprays into both nostrils 4 (four) times daily. (Patient taking differently: Place 2 sprays into both nostrils 4 (four) times daily. As needed), Disp: 15 mL, Rfl: 0   ondansetron (ZOFRAN-ODT) 8 MG disintegrating tablet, Take 1 tablet (8 mg total) by mouth every 8 (eight) hours as needed for nausea or vomiting., Disp: 20 tablet, Rfl: 0   polyethylene glycol (MIRALAX / GLYCOLAX) 17 g packet, Take 17 g by mouth as needed., Disp: , Rfl:   No  results found.  No images are attached to the encounter.      Latest Ref Rng & Units 11/22/2023    9:08 AM  CMP  Glucose 70 - 99 mg/dL 99   BUN 6 - 23 mg/dL 14   Creatinine 4.09 - 1.20 mg/dL 8.11   Sodium 914 - 782 mEq/L 139   Potassium 3.5 - 5.1 mEq/L 3.8   Chloride 96 - 112 mEq/L 105   CO2 19 - 32 mEq/L 24   Calcium 8.4 - 10.5 mg/dL 9.3   Total Protein 6.0 - 8.3 g/dL 7.1   Total Bilirubin 0.2 - 1.2 mg/dL 0.4   Alkaline Phos 39 - 117 U/L 48   AST 0 - 37 U/L 19   ALT 0 - 35 U/L 13       Latest Ref Rng & Units 02/11/2024   11:24 AM  CBC  WBC 4.0 - 10.5 K/uL 7.7   Hemoglobin 12.0 - 15.0 g/dL 95.6   Hematocrit 21.3 - 46.0 % 39.7   Platelets 150 - 400 K/uL 394      Observation/objective: Appears in no acute distress over video visit today.  Breathing is nonlabored  Assessment and plan: Patient is a 53 year old female with history of stage I ER/PR positive HER2 negative left breast cancer now referred for thrombocytosis  Thrombocytosis: Possibly secondary to iron deficiency.  Overall it is mild and intermittent and occasionally her platelet counts go up into the 400s.  Testing for myeloproliferative disorder including JAK2, CALR, MPL, BCR-ABL FISH testing negative.  Her iron studies are indicative of mild iron deficiency given that her ferritin levels are less than 50 which Holder be a cause for thrombocytosis.  Regardless patient does not require any further workup for thrombocytosis.  She can be referred to [place] in the future if there is a consistent rise in her platelet counts.  Patient also does not wish to receive any IV iron today and will continue with oral iron every other day as tolerated  History of left breast cancer: Patient is more than 5 years out from her diagnosis of her breast cancer.  She will require yearly breast exams and mammograms which she wishes to continue to do it with her primary care doctor.  Follow-up  instructions: No follow-up needed with me  I  discussed the assessment and treatment plan with the patient. The patient was provided an opportunity to ask questions and all were answered. The patient agreed with the plan and demonstrated an understanding of the instructions.   The patient was advised to call back or seek an in-person evaluation if the symptoms worsen or if the condition fails to improve as anticipated.  I provided 12 minutes of face-to-face video visit time during this encounter, and > 50% was spent counseling as documented under my assessment & plan.  Visit Diagnosis: 1. Thrombocytosis   2. History of iron deficiency     Dr. Owens Shark, MD, MPH [facility] Tel- [phone] 02/25/2024 8:16 AM

## 2024-03-20 ENCOUNTER — Other Ambulatory Visit: Payer: Self-pay | Admitting: Nurse Practitioner

## 2024-03-20 DIAGNOSIS — U071 COVID-19: Secondary | ICD-10-CM

## 2024-04-04 ENCOUNTER — Other Ambulatory Visit: Payer: Self-pay | Admitting: Nurse Practitioner

## 2024-04-04 DIAGNOSIS — U071 COVID-19: Secondary | ICD-10-CM

## 2024-06-05 ENCOUNTER — Other Ambulatory Visit: Payer: Self-pay

## 2024-06-06 MED ORDER — HYDROXYZINE HCL 10 MG PO TABS: ORAL_TABLET | ORAL | 2 refills | Status: DC

## 2024-06-27 ENCOUNTER — Telehealth: Admitting: Family Medicine

## 2024-06-27 DIAGNOSIS — R112 Nausea with vomiting, unspecified: Secondary | ICD-10-CM | POA: Diagnosis not present

## 2024-06-27 MED ORDER — ONDANSETRON 4 MG PO TBDP: 4.0000 mg | ORAL_TABLET | Freq: Three times a day (TID) | ORAL | 0 refills | Status: DC | PRN

## 2024-06-27 NOTE — Progress Notes (Signed)
 E-Visit for Nausea and Vomiting   We are sorry that you are not feeling well. Here is how we plan to help!  Based on what you have shared with me it looks like you have a Virus that is irritating your GI tract.  Vomiting is the forceful emptying of a portion of the stomach's content through the mouth.  Although nausea and vomiting can make you feel miserable, it's important to remember that these are not diseases, but rather symptoms of an underlying illness.  When we treat short term symptoms, we always caution that any symptoms that persist should be fully evaluated in a medical office.  I have prescribed a medication that will help alleviate your symptoms and allow you to stay hydrated:  Zofran 4 mg 1 tablet every 8 hours as needed for nausea and vomiting  HOME CARE: Drink clear liquids.  This is very important! Dehydration (the lack of fluid) can lead to a serious complication.  Start off with 1 tablespoon every 5 minutes for 8 hours. You may begin eating bland foods after 8 hours without vomiting.  Start with saltine crackers, white bread, rice, mashed potatoes, applesauce. After 48 hours on a bland diet, you may resume a normal diet. Try to go to sleep.  Sleep often empties the stomach and relieves the need to vomit.  GET HELP RIGHT AWAY IF:  Your symptoms do not improve or worsen within 2 days after treatment. You have a fever for over 3 days. You cannot keep down fluids after trying the medication.  MAKE SURE YOU:  Understand these instructions. Will watch your condition. Will get help right away if you are not doing well or get worse.    Thank you for choosing an e-visit.  Your e-visit answers were reviewed by a board certified advanced clinical practitioner to complete your personal care plan. Depending upon the condition, your plan could have included both over the counter or prescription medications.  Please review your pharmacy choice. Make sure the pharmacy is open so  you can pick up prescription now. If there is a problem, you may contact your provider through [employer] and have the prescription routed to another pharmacy.  Your safety is important to [place]. If you have drug allergies check your prescription carefully.   For the next 24 hours you can use MyChart to ask questions about today's visit, request a non-urgent call back, or ask for a work or school excuse. You will get an email in the next two days asking about your experience. I hope that your e-visit has been valuable and will speed your recovery.  I provided 5 minutes of non face-to-face time during this encounter for chart review, medication and order placement, as well as and documentation.

## 2024-08-11 ENCOUNTER — Other Ambulatory Visit: Payer: Self-pay | Admitting: Nurse Practitioner

## 2024-10-17 ENCOUNTER — Telehealth: Admitting: Emergency Medicine

## 2024-10-17 DIAGNOSIS — U071 COVID-19: Secondary | ICD-10-CM

## 2024-10-17 NOTE — Patient Instructions (Signed)
  Melinda Holder, thank you for joining Jon CHRISTELLA Belt, NP for today's virtual visit.  While this provider is not your primary care provider (PCP), if your PCP is located in our provider database this encounter information will be shared with them immediately following your visit.   A [facility] MyChart account gives you access to today's visit and all your visits, tests, and labs performed at [facility]  click here if you don't have a [facility] MyChart account or go to [email or web address]  Consent: (Patient) Melinda Holder provided verbal consent for this virtual visit at the beginning of the encounter.  Current Medications:  Current Outpatient Medications:    hydrOXYzine  (ATARAX ) 10 MG tablet, TAKE 1 TABLET BY MOUTH EVERYDAY AT BEDTIME, Disp: 90 tablet, Rfl: 0   ipratropium (ATROVENT ) 0.06 % nasal spray, PLACE 2 SPRAYS INTO BOTH NOSTRILS 4 TIMES DAILY., Disp: 15 mL, Rfl: 1   ondansetron  (ZOFRAN -ODT) 4 MG disintegrating tablet, Take 1 tablet (4 mg total) by mouth every 8 (eight) hours as needed for nausea or vomiting., Disp: 20 tablet, Rfl: 0   polyethylene glycol (MIRALAX / GLYCOLAX) 17 g packet, Take 17 g by mouth as needed., Disp: , Rfl:    Medications ordered in this encounter:  No orders of the defined types were placed in this encounter.    *If you need refills on other medications prior to your next appointment, please contact your pharmacy*  Follow-Up: Call back or seek an in-person evaluation if the symptoms worsen or if the condition fails to improve as anticipated.  [facility] [phone]  Other Instructions You are taking the right medicines to help manage your symptoms of covid. Make sure you are getting plenty of rest.   You are ok to go back to work when you feel better/feel up to it and don't have a fever. I recommend wearing a mask around others until you are 10 days out from when you first felt sick.    If you have been  instructed to have an in-person evaluation today at a local Urgent Care facility, please use the link below. It will take you to a list of all of our available [facility], including address, phone number and hours of operation. Please do not delay care.  [facility]  If you or a family member do not have a primary care provider, use the link below to schedule a visit and establish care. When you choose a [facility] [place] primary care physician or advanced practice provider, you gain a long-term partner in health. Find a Primary Care Provider  Learn more about [facility] in-office and virtual care options:  - Get Care Now

## 2024-10-17 NOTE — Progress Notes (Signed)
 Virtual Visit Consent   Melinda Holder, you are scheduled for a virtual visit with a [facility] provider today. Just as with appointments in the office, your consent must be obtained to participate. Your consent will be active for this visit and any virtual visit you may have with one of our providers in the next 365 days. If you have a MyChart account, a copy of this consent can be sent to you electronically.  As this is a virtual visit, video technology does not allow for your provider to perform a traditional examination. This may limit your provider's ability to fully assess your condition. If your provider identifies any concerns that need to be evaluated in person or the need to arrange testing (such as labs, EKG, etc.), we will make arrangements to do so. Although advances in technology are sophisticated, we cannot ensure that it will always work on either your end or our end. If the connection with a video visit is poor, the visit may have to be switched to a telephone visit. With either a video or telephone visit, we are not always able to ensure that we have a secure connection.  By engaging in this virtual visit, you consent to the provision of healthcare and authorize for your insurance to be billed (if applicable) for the services provided during this visit. Depending on your insurance coverage, you may receive a charge related to this service.  I need to obtain your verbal consent now. Are you willing to proceed with your visit today? Melinda Holder has provided verbal consent on 10/17/2024 for a virtual visit (video or telephone). Jon CHRISTELLA Belt, NP  Date: 10/17/2024 10:04 AM   Virtual Visit via Video Note   I, Jon CHRISTELLA Belt, connected with  Melinda Holder  ([id number], [birth year]) on 10/17/24 at 10:00 AM EDT by a video-enabled telemedicine application and verified that I am speaking with the correct person using two identifiers.  Location: Patient: Virtual Visit Location  Patient: Home Provider: Virtual Visit Location Provider: Home Office   I discussed the limitations of evaluation and management by telemedicine and the availability of in person appointments. The patient expressed understanding and agreed to proceed.    History of Present Illness: Melinda Holder is a 53 y.o. who identifies as a female who was assigned female at birth, and is being seen today for Covid. Tested positive yesterday Sore throat started 10/12/24  Paxlovid  is too expensive. Declines.   Has cough, drainage, sore throat at night, very tired. Headache. Taking tylenol  and mucinex. Cough drops for sore throat. Increased fluids. No SOB. Fever yesterday 103F, no fever today. Wants to know when she can return to work/when she is contagious.   Taking Tylenol  1000mg  every 6 to 8 hours.   HPI: HPI  Problems:  Patient Active Problem List   Diagnosis Date Noted   Elevated platelet count 01/28/2024   Uterine leiomyoma 01/28/2024   Axillary lymphadenopathy 01/28/2024   Hx of colonic polyps 11/19/2023   Polyp of sigmoid colon 11/19/2023   Positive self-administered antigen test for COVID-19 09/14/2023   Hot flashes due to menopause 08/02/2023   LFTs abnormal 08/02/2023   Microscopic hematuria 08/02/2023   Hematochezia 08/02/2023   Atypical chest pain 11/18/2022   Prediabetes 11/16/2022   Breast lesion 12/13/2020   Chronic headaches 11/14/2020   Need for prophylactic vaccination and inoculation against influenza 09/20/2019   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HCC)    Malignant neoplasm of  left female breast (HCC) 11/03/2018   Iron  deficiency anemia 05/20/2017   Routine general medical examination at a health care facility 02/27/2016   Overweight (BMI 25.0-29.9) 09/03/2015    Allergies: No Known Allergies Medications:  Current Outpatient Medications:    hydrOXYzine  (ATARAX ) 10 MG tablet, TAKE 1 TABLET BY MOUTH EVERYDAY AT BEDTIME, Disp: 90 tablet, Rfl: 0    ipratropium (ATROVENT ) 0.06 % nasal spray, PLACE 2 SPRAYS INTO BOTH NOSTRILS 4 TIMES DAILY., Disp: 15 mL, Rfl: 1   ondansetron  (ZOFRAN -ODT) 4 MG disintegrating tablet, Take 1 tablet (4 mg total) by mouth every 8 (eight) hours as needed for nausea or vomiting., Disp: 20 tablet, Rfl: 0   polyethylene glycol (MIRALAX / GLYCOLAX) 17 g packet, Take 17 g by mouth as needed., Disp: , Rfl:   Observations/Objective: Patient is well-developed, well-nourished in no acute distress.  Resting comfortably  at home.  Head is normocephalic, atraumatic.  No labored breathing.  Speech is clear and coherent with logical content.  Patient is alert and oriented at baseline.    Assessment and Plan: 1. Positive self-administered antigen test for COVID-19 (Primary)  Reviewed supportive care and wearing a mask around others  Follow Up Instructions: I discussed the assessment and treatment plan with the patient. The patient was provided an opportunity to ask questions and all were answered. The patient agreed with the plan and demonstrated an understanding of the instructions.  A copy of instructions were sent to the patient via MyChart unless otherwise noted below.   The patient was advised to call back or seek an in-person evaluation if the symptoms worsen or if the condition fails to improve as anticipated.    Jon CHRISTELLA Belt, NP

## 2024-11-28 ENCOUNTER — Ambulatory Visit: Payer: BC Managed Care – PPO | Admitting: Nurse Practitioner

## 2024-11-28 ENCOUNTER — Encounter: Payer: Self-pay | Admitting: Nurse Practitioner

## 2024-11-28 ENCOUNTER — Other Ambulatory Visit (HOSPITAL_COMMUNITY)
Admission: RE | Admit: 2024-11-28 | Discharge: 2024-11-28 | Disposition: A | Discharge status: Home / Self Care | Source: Ambulatory Visit | Attending: Nurse Practitioner | Admitting: Nurse Practitioner

## 2024-11-28 ENCOUNTER — Encounter: Payer: BC Managed Care – PPO | Admitting: Nurse Practitioner

## 2024-11-28 VITALS — BP 160/102 | HR 78 | Temp 98.5°F | Ht 62.0 in | Wt 146.8 lb

## 2024-11-28 DIAGNOSIS — Z1329 Encounter for screening for other suspected endocrine disorder: Secondary | ICD-10-CM

## 2024-11-28 DIAGNOSIS — Z01419 Encounter for gynecological examination (general) (routine) without abnormal findings: Secondary | ICD-10-CM

## 2024-11-28 DIAGNOSIS — R7989 Other specified abnormal findings of blood chemistry: Secondary | ICD-10-CM

## 2024-11-28 DIAGNOSIS — Z113 Encounter for screening for infections with a predominantly sexual mode of transmission: Secondary | ICD-10-CM | POA: Insufficient documentation

## 2024-11-28 DIAGNOSIS — R7303 Prediabetes: Secondary | ICD-10-CM

## 2024-11-28 DIAGNOSIS — D509 Iron deficiency anemia, unspecified: Secondary | ICD-10-CM

## 2024-11-28 DIAGNOSIS — Z23 Encounter for immunization: Secondary | ICD-10-CM | POA: Diagnosis not present

## 2024-11-28 DIAGNOSIS — Z13220 Encounter for screening for lipoid disorders: Secondary | ICD-10-CM

## 2024-11-28 DIAGNOSIS — R0789 Other chest pain: Secondary | ICD-10-CM

## 2024-11-28 DIAGNOSIS — Z1231 Encounter for screening mammogram for malignant neoplasm of breast: Secondary | ICD-10-CM

## 2024-11-28 DIAGNOSIS — Z124 Encounter for screening for malignant neoplasm of cervix: Secondary | ICD-10-CM

## 2024-11-28 LAB — CBC WITH DIFFERENTIAL/PLATELET
Basophils Absolute: 0 K/uL (ref 0.0–0.1)
Basophils Relative: 0.7 % (ref 0.0–3.0)
Eosinophils Absolute: 0.2 K/uL (ref 0.0–0.7)
Eosinophils Relative: 3.3 % (ref 0.0–5.0)
HCT: 41 % (ref 36.0–46.0)
Hemoglobin: 14 g/dL (ref 12.0–15.0)
Lymphocytes Relative: 21.7 % (ref 12.0–46.0)
Lymphs Abs: 1.4 K/uL (ref 0.7–4.0)
MCHC: 34.1 g/dL (ref 30.0–36.0)
MCV: 93 fl (ref 78.0–100.0)
Monocytes Absolute: 0.4 K/uL (ref 0.1–1.0)
Monocytes Relative: 6.3 % (ref 3.0–12.0)
Neutro Abs: 4.3 K/uL (ref 1.4–7.7)
Neutrophils Relative %: 68 % (ref 43.0–77.0)
Platelets: 415 K/uL — ABNORMAL HIGH (ref 150.0–400.0)
RBC: 4.41 Mil/uL (ref 3.87–5.11)
RDW: 14 % (ref 11.5–15.5)
WBC: 6.3 K/uL (ref 4.0–10.5)

## 2024-11-28 LAB — COMPREHENSIVE METABOLIC PANEL WITH GFR
ALT: 13 U/L (ref 0–35)
AST: 18 U/L (ref 0–37)
Albumin: 4.5 g/dL (ref 3.5–5.2)
Alkaline Phosphatase: 47 U/L (ref 39–117)
BUN: 14 mg/dL (ref 6–23)
CO2: 30 meq/L (ref 19–32)
Calcium: 9.7 mg/dL (ref 8.4–10.5)
Chloride: 103 meq/L (ref 96–112)
Creatinine, Ser: 0.66 mg/dL (ref 0.40–1.20)
GFR: 100.73 mL/min (ref >60.00)
Glucose, Bld: 106 mg/dL — ABNORMAL HIGH (ref 70–99)
Potassium: 4.4 meq/L (ref 3.5–5.1)
Sodium: 140 meq/L (ref 135–145)
Total Bilirubin: 0.5 mg/dL (ref 0.2–1.2)
Total Protein: 7.5 g/dL (ref 6.0–8.3)

## 2024-11-28 LAB — IBC + FERRITIN
Ferritin: 38.9 ng/mL (ref 10.0–291.0)
Iron: 118 ug/dL (ref 42–145)
Saturation Ratios: 32.9 % (ref 20.0–50.0)
TIBC: 358.4 ug/dL (ref 250.0–450.0)
Transferrin: 256 mg/dL (ref 212.0–360.0)

## 2024-11-28 LAB — LIPID PANEL
Cholesterol: 205 mg/dL — ABNORMAL HIGH (ref 0–200)
HDL: 52.7 mg/dL (ref >39.00)
LDL Cholesterol: 127 mg/dL — ABNORMAL HIGH (ref 0–99)
NonHDL: 151.96
Total CHOL/HDL Ratio: 4
Triglycerides: 126 mg/dL (ref 0.0–149.0)
VLDL: 25.2 mg/dL (ref 0.0–40.0)

## 2024-11-28 LAB — VITAMIN D 25 HYDROXY (VIT D DEFICIENCY, FRACTURES): VITD: 46.11 ng/mL (ref 30.00–100.00)

## 2024-11-28 LAB — HEMOGLOBIN A1C: Hgb A1c MFr Bld: 5.7 % (ref 4.6–6.5)

## 2024-11-28 LAB — TSH: TSH: 1.18 u[IU]/mL (ref 0.35–5.50)

## 2024-11-28 NOTE — Progress Notes (Unsigned)
 Melinda Glance, NP-C Phone: [phone]  Melinda Holder is a 53 y.o. female who presents today for annual exam.   Discussed the use of AI scribe software for clinical note transcription with the patient, who gave verbal consent to proceed.  History of Present Illness   Melinda Holder is a 53 year old female who presents for an annual physical exam.  She experiences sharp, transient chest pains intermittently, approximately two to three times every three months. The pain lasts only a second before resolving. There is no associated numbness in her arms, shortness of breath, heart palpitations, or pressure in the chest radiating to the arm or jaw.  She has a history of breast cancer and underwent biopsies last year. She expresses concern about her personal and family history of breast and ovarian cancer, including an aunt affected by these conditions.  No abdominal pain, constipation, diarrhea, burning during urination, abnormal discharge, headaches, dizziness, trouble swallowing, skin changes, rashes, joint pains, mood issues, anxiety, or depression. She reports generally good sleep, though occasionally it is difficult to sleep.  Her social history includes no smoking, occasional alcohol consumption on weekends, and no drug use. She maintains a diet that includes eating out frequently but tries to choose healthier options like salads, chicken, or fish. She drinks a lot of water  during the week and exercises by walking three times a week.      Social History   Tobacco Use  Smoking Status Former   Current packs/day: 0.00   Average packs/day: 1 pack/day for 15.0 years (15.0 ttl pk-yrs)   Types: Cigarettes   Start date: 02/22/2000   Quit date: 02/21/2015   Years since quitting: 9.8  Smokeless Tobacco Never    Current Outpatient Medications on File Prior to Visit  Medication Sig Dispense Refill   ipratropium (ATROVENT ) 0.06 % nasal spray PLACE 2 SPRAYS INTO BOTH NOSTRILS 4 TIMES DAILY.  15 mL 1   ondansetron  (ZOFRAN -ODT) 4 MG disintegrating tablet Take 1 tablet (4 mg total) by mouth every 8 (eight) hours as needed for nausea or vomiting. 20 tablet 0   polyethylene glycol (MIRALAX / GLYCOLAX) 17 g packet Take 17 g by mouth as needed.     [DISCONTINUED] fluticasone  (FLONASE ) 50 MCG/ACT nasal spray Place 2 sprays into both nostrils daily. 16 g 0   No current facility-administered medications on file prior to visit.     ROS see history of present illness  Objective  Physical Exam Vitals:   11/28/24 0807 11/28/24 0849  BP: (!) 162/88 (!) 160/102  Pulse: 78   Temp: 98.5 F (36.9 C)   SpO2: 97%     BP Readings from Last 3 Encounters:  11/28/24 (!) 160/102  02/11/24 126/83  01/28/24 122/82   Wt Readings from Last 3 Encounters:  11/28/24 146 lb 12.8 oz (66.6 kg)  02/11/24 154 lb (69.9 kg)  01/28/24 150 lb 6.4 oz (68.2 kg)    Physical Exam Exam conducted with a chaperone present Claryce Ellen, CMA).  Constitutional:      General: She is not in acute distress.    Appearance: Normal appearance.  HENT:     Head: Normocephalic.     Right Ear: Tympanic membrane normal.     Left Ear: Tympanic membrane normal.     Nose: Nose normal.     Mouth/Throat:     Mouth: Mucous membranes are moist.     Pharynx: Oropharynx is clear.  Eyes:     Conjunctiva/sclera: Conjunctivae normal.  Pupils: Pupils are equal, round, and reactive to light.  Neck:     Thyroid : No thyromegaly.  Cardiovascular:     Rate and Rhythm: Normal rate and regular rhythm.     Heart sounds: Normal heart sounds.  Pulmonary:     Effort: Pulmonary effort is normal.     Breath sounds: Normal breath sounds.  Abdominal:     General: Abdomen is flat. Bowel sounds are normal.     Palpations: Abdomen is soft. There is no mass.     Tenderness: There is no abdominal tenderness.  Genitourinary:    General: Normal vulva.     Labia:        Right: No rash or lesion.        Left: No rash or lesion.       Vagina: Normal. No vaginal discharge or bleeding.     Cervix: Normal. No discharge, lesion or cervical bleeding.     Uterus: Normal.   Musculoskeletal:        General: Normal range of motion.  Lymphadenopathy:     Cervical: No cervical adenopathy.  Skin:    General: Skin is warm and dry.     Findings: No rash.  Neurological:     General: No focal deficit present.     Mental Status: She is alert.  Psychiatric:        Mood and Affect: Mood normal.        Behavior: Behavior normal.      Assessment/Plan: Please see individual problem list.  Well woman exam with routine gynecological exam Assessment & Plan: Physical exam complete. We will check lab work as outlined. Pap smear performed today. Mammogram due in January, order placed. Colonoscopy is up to date. Flu vaccine administered today. Tetanus vaccine is up to date. Declines Shingles vaccines and additional COVID vaccines. Continue routine dental and eye exams. Encourage healthy diet and regular exercise. Return to care in one year, sooner as needed.   Orders: -     Comprehensive metabolic panel with GFR -     VITAMIN D  25 Hydroxy (Vit-D Deficiency, Fractures)  Atypical chest pain Assessment & Plan: Chronic issue. Chest pain occurs infrequently without associated symptoms. Differential includes cardiac causes and GERD. EKG without acute findings, sinus rhythm. Strict precautions given to patient. Consider referral to Cardiology if persisting.   Orders: -     EKG 12-Lead  Elevated platelet count Assessment & Plan: Likely reactive. Check CBC.   Orders: -     CBC with Differential/Platelet  Iron  deficiency anemia, unspecified iron  deficiency anemia type Assessment & Plan: Check iron  panel.   Orders: -     IBC + Ferritin  Prediabetes Assessment & Plan: Encourage healthy diet and regular exercise. Check A1c.   Orders: -     Hemoglobin A1c  Screening for cervical cancer -     Cytology - PAP  Screening  mammogram for breast cancer -     3D Screening Mammogram, Left and Right; Future  Need for influenza vaccination -     Flu vaccine trivalent PF, 6mos and older(Flulaval,Afluria,Fluarix,Fluzone)  Lipid screening -     Lipid panel  Thyroid  disorder screen -     TSH     Return in about 1 year (around 11/28/2025) for Annual Exam, sooner as needed.   Melinda Glance, NP-C [facility]

## 2024-12-07 ENCOUNTER — Other Ambulatory Visit: Payer: Self-pay | Admitting: Nurse Practitioner

## 2024-12-11 LAB — CYTOLOGY - PAP
Chlamydia: NEGATIVE
Comment: NEGATIVE
Comment: NEGATIVE
Comment: NEGATIVE
Comment: NORMAL
Diagnosis: NEGATIVE
High risk HPV: NEGATIVE
Neisseria Gonorrhea: NEGATIVE
Trichomonas: NEGATIVE

## 2024-12-12 ENCOUNTER — Ambulatory Visit: Payer: Self-pay | Admitting: Nurse Practitioner

## 2024-12-12 ENCOUNTER — Encounter: Payer: Self-pay | Admitting: Nurse Practitioner

## 2024-12-12 DIAGNOSIS — Z01419 Encounter for gynecological examination (general) (routine) without abnormal findings: Secondary | ICD-10-CM | POA: Insufficient documentation

## 2024-12-12 NOTE — Assessment & Plan Note (Signed)
 Check iron panel

## 2024-12-12 NOTE — Assessment & Plan Note (Signed)
 Physical exam complete. We will check lab work as outlined. Pap smear performed today. Mammogram due in January, order placed. Colonoscopy is up to date. Flu vaccine administered today. Tetanus vaccine is up to date. Declines Shingles vaccines and additional COVID vaccines. Continue routine dental and eye exams. Encourage healthy diet and regular exercise. Return to care in one year, sooner as needed.

## 2024-12-12 NOTE — Assessment & Plan Note (Signed)
 Likely reactive. Check CBC.

## 2024-12-12 NOTE — Assessment & Plan Note (Signed)
 Chronic issue. Chest pain occurs infrequently without associated symptoms. Differential includes cardiac causes and GERD. EKG without acute findings, sinus rhythm. Strict precautions given to patient. Consider referral to Cardiology if persisting.

## 2024-12-12 NOTE — Assessment & Plan Note (Signed)
 Encourage healthy diet and regular exercise. Check A1c.

## 2025-01-10 ENCOUNTER — Ambulatory Visit
Admission: RE | Admit: 2025-01-10 | Discharge: 2025-01-10 | Disposition: A | Discharge status: Home / Self Care | Source: Ambulatory Visit | Attending: Nurse Practitioner | Admitting: Nurse Practitioner

## 2025-01-10 DIAGNOSIS — Z1231 Encounter for screening mammogram for malignant neoplasm of breast: Secondary | ICD-10-CM | POA: Insufficient documentation

## 2025-01-24 ENCOUNTER — Ambulatory Visit
Admission: EM | Admit: 2025-01-24 | Discharge: 2025-01-24 | Disposition: A | Discharge status: Home / Self Care | Attending: Family Medicine | Admitting: Family Medicine

## 2025-01-24 DIAGNOSIS — R112 Nausea with vomiting, unspecified: Secondary | ICD-10-CM

## 2025-01-24 DIAGNOSIS — J111 Influenza due to unidentified influenza virus with other respiratory manifestations: Secondary | ICD-10-CM

## 2025-01-24 LAB — POC COVID19/FLU A&B COMBO
Covid Antigen, POC: NEGATIVE
Influenza A Antigen, POC: NEGATIVE
Influenza B Antigen, POC: NEGATIVE

## 2025-01-24 MED ORDER — ONDANSETRON 4 MG PO TBDP
4.0000 mg | ORAL_TABLET | Freq: Once | ORAL | Status: AC
  Administered 2025-01-24: 4 mg via ORAL

## 2025-01-24 MED ORDER — IPRATROPIUM BROMIDE 0.06 % NA SOLN: 2.0000 | Freq: Four times a day (QID) | NASAL | 0 refills | Status: AC

## 2025-01-24 MED ORDER — ONDANSETRON 4 MG PO TBDP: 4.0000 mg | ORAL_TABLET | Freq: Three times a day (TID) | ORAL | 0 refills | Status: AC | PRN

## 2025-01-24 NOTE — ED Provider Notes (Signed)
 " [facility]    CSN: [id number] Arrival date & time: 01/24/25  0911      History   Chief Complaint Chief Complaint  Patient presents with   Generalized Body Aches   Emesis   Diarrhea    HPI Melinda Holder is a 54 y.o. female.   HPI  History obtained from the patient. Karletta presents for vomiting, diarrhea, body aches, nasal congestion, fever, headache for the past 2 days.  Has chest congestion but not a cough.  Has tried AlkaSeltzer cold and flu. No known sick contacts.  She travels for her job.  Endorses scratchy throat.  Tmax 102 F.  No medications this morning.    She has hx of childhood asthma.  Denies smoking and vaping.        Past Medical History:  Diagnosis Date   Amblyopia of eye, right    Anemia    in the past   Asthma    childhood   Breast cancer (HCC) 2019   Cancer (HCC) 10/27/2018   left breast   Difficult intubation 11/03/2016   DL x 2 with MAC 3 (CRNA and MD), very anterior. DL x 2 with Glidescope #3 Lo pro, bougie utilized. Easy mask.   Endometriosis    GERD (gastroesophageal reflux disease)    not for a while, tomato products   Iron  deficiency anemia    Migraine    2 months ago   Personal history of radiation therapy     Patient Active Problem List   Diagnosis Date Noted   Well woman exam with routine gynecological exam 12/12/2024   Elevated platelet count 01/28/2024   Uterine leiomyoma 01/28/2024   Axillary lymphadenopathy 01/28/2024   Hx of colonic polyps 11/19/2023   Polyp of sigmoid colon 11/19/2023   Positive self-administered antigen test for COVID-19 09/14/2023   Hot flashes due to menopause 08/02/2023   LFTs abnormal 08/02/2023   Microscopic hematuria 08/02/2023   Hematochezia 08/02/2023   Atypical chest pain 11/18/2022   Prediabetes 11/16/2022   Breast lesion 12/13/2020   Chronic headaches 11/14/2020   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HCC)    Malignant neoplasm of left female breast  (HCC) 11/03/2018   Iron  deficiency anemia 05/20/2017   Routine general medical examination at a health care facility 02/27/2016   Overweight (BMI 25.0-29.9) 09/03/2015    Past Surgical History:  Procedure Laterality Date   ABLATION ON ENDOMETRIOSIS     BREAST BIOPSY Left 10/28/2018   INVASIVE MAMMARY CARCINOMA   BREAST EXCISIONAL BIOPSY Left 10/28/2018   BREAST LUMPECTOMY Left 11/17/2018   INVASIVE MAMMARY CARCINOMA   BREAST LUMPECTOMY Left 12/19/2018   re-excision Procedure: BREAST LUMPECTOMY;  Surgeon: Jordis Laneta FALCON, MD;  Location: [facility];  Service: General;  Laterality: Left;   BREAST LUMPECTOMY WITH NEEDLE LOCALIZATION Left 11/17/2018   Procedure: BREAST LUMPECTOMY WITH NEEDLE LOCALIZATION;  Surgeon: Jordis Laneta FALCON, MD;  Location: [facility];  Service: General;  Laterality: Left;   CLAVICLE SURGERY Left 10/2016   COLONOSCOPY WITH PROPOFOL  N/A 06/27/2018   Procedure: COLONOSCOPY WITH PROPOFOL ;  Surgeon: Jinny Carmine, MD;  Location: [facility];  Service: Endoscopy;  Laterality: N/A;   COLONOSCOPY WITH PROPOFOL  N/A 11/19/2023   Procedure: COLONOSCOPY WITH PROPOFOL  with polypectomy;  Surgeon: Jinny Carmine, MD;  Location: [facility];  Service: Endoscopy;  Laterality: N/A;   ESOPHAGOGASTRODUODENOSCOPY (EGD) WITH PROPOFOL  N/A 06/27/2018   Procedure: ESOPHAGOGASTRODUODENOSCOPY (EGD) WITH PROPOFOL ;  Surgeon: Jinny Carmine, MD;  Location:  [facility];  Service: Endoscopy;  Laterality: N/A;   EYE MUSCLE SURGERY Right    child   ORIF CLAVICULAR FRACTURE Left 11/03/2016   Procedure: OPEN REDUCTION INTERNAL FIXATION (ORIF) CLAVICULAR FRACTURE;  Surgeon: Norleen JINNY Maltos, MD;  Location: [facility];  Service: Orthopedics;  Laterality: Left;   removal of fibroid     SENTINEL NODE BIOPSY Left 11/17/2018   Procedure: SENTINEL NODE BIOPSY;  Surgeon: Jordis Laneta FALCON, MD;  Location: [facility];  Service: General;  Laterality: Left;    OB History     Gravida  2   Para  0   Term  0    Preterm  0   AB  2   Living  0      SAB  0   IAB  0   Ectopic  0   Multiple  0   Live Births  0        Obstetric Comments  1st Menstrual Cycle:  14             Home Medications    Prior to Admission medications  Medication Sig Start Date End Date Taking? Authorizing Provider  ipratropium (ATROVENT ) 0.06 % nasal spray Place 2 sprays into both nostrils 4 (four) times daily. 01/24/25  Yes Takera Rayl, DO  hydrOXYzine  (ATARAX ) 10 MG tablet TAKE 1 TABLET BY MOUTH EVERYDAY AT BEDTIME Patient not taking: Reported on 01/24/2025 12/07/24   Gretel App, NP  ondansetron  (ZOFRAN -ODT) 4 MG disintegrating tablet Take 1 tablet (4 mg total) by mouth every 8 (eight) hours as needed for nausea or vomiting. 01/24/25   Hillary Schwegler, DO  polyethylene glycol (MIRALAX / GLYCOLAX) 17 g packet Take 17 g by mouth as needed.    [provider]  fluticasone  (FLONASE ) 50 MCG/ACT nasal spray Place 2 sprays into both nostrils daily. 08/26/20 02/18/21  Van Knee, MD    Family History Family History  Problem Relation Age of Onset   Lupus Mother    Hypertension Mother    Lymphoma Mother 61       B cell; currently 44   Hypertension Father        currently 8   Diabetes Father    Breast cancer Paternal Aunt 73       deceased 59   Lung cancer Paternal Aunt 36       deceased 21   Brain cancer Paternal Grandfather 5       deceased 81    Social History Social History[1]   Allergies   Patient has no known allergies.   Review of Systems Review of Systems: negative unless otherwise stated in HPI.      Physical Exam Triage Vital Signs ED Triage Vitals  Encounter Vitals Group     BP 01/24/25 0926 (!) 147/80     Girls Systolic BP Percentile --      Girls Diastolic BP Percentile --      Boys Systolic BP Percentile --      Boys Diastolic BP Percentile --      Pulse Rate 01/24/25 0926 82     Resp 01/24/25 0926 18     Temp 01/24/25 0926 97.8 F (36.6 C)      Temp src --      SpO2 01/24/25 0926 97 %     Weight 01/24/25 0924 148 lb 12.8 oz (67.5 kg)     Height --      Head Circumference --      Peak  Flow --      Pain Score 01/24/25 0925 9     Pain Loc --      Pain Education --      Exclude from Growth Chart --    No data found.  Updated Vital Signs BP (!) 147/80   Pulse 82   Temp 97.8 F (36.6 C)   Resp 18   Wt 67.5 kg   LMP 12/13/2020 Comment: missed past 2 months   SpO2 97%   BMI 27.22 kg/m   Visual Acuity Right Eye Distance:   Left Eye Distance:   Bilateral Distance:    Right Eye Near:   Left Eye Near:    Bilateral Near:     Physical Exam GEN:     alert, non-toxic appearing female in no distress    HENT:  mucus membranes moist, oropharyngeal without lesions, moderate erythema, no tonsillar hypertrophy or exudates, clear nasal discharge, bilateral TM normal EYES:   pupils equal and reactive, no scleral injection or discharge NECK:  normal ROM, no lymphadenopathy, no meningismus   RESP:  no increased work of breathing, clear to auscultation bilaterally CVS:   regular rate and rhythm Skin:   warm and dry    UC Treatments / Results  Labs (all labs ordered are listed, but only abnormal results are displayed) Labs Reviewed  POC COVID19/FLU A&B COMBO - Normal    EKG   Radiology No results found.   Procedures Procedures (including critical care time)  Medications Ordered in UC Medications  ondansetron  (ZOFRAN -ODT) disintegrating tablet 4 mg (4 mg Oral Given 01/24/25 0944)    Initial Impression / Assessment and Plan / UC Course  I have reviewed the triage vital signs and the nursing notes.  Pertinent labs & imaging results that were available during my care of the patient were reviewed by me and considered in my medical decision making (see chart for details).       Pt is a 54 y.o. female who presents for 2 days of respiratory symptoms. Shamiyah is afebrile here without recent antipyretics. Satting well  on room air. Overall pt is non-toxic appearing, well hydrated, without respiratory distress. Pulmonary exam is unremarkable. Zofran  given and prescribed for nausea with vomiting.  POC COVID and influenza panel obtained and was negative.    Suspect viral respiratory illness. Discussed symptomatic treatment.   Atrovent  nasal spray for nasal congestion.  Explained lack of efficacy of antibiotics in viral disease.  Typical duration of symptoms discussed. Work note provided.   Return and ED precautions given and voiced understanding. Discussed MDM, treatment plan and plan for follow-up with patient who agrees with plan.     Final Clinical Impressions(s) / UC Diagnoses   Final diagnoses:  Nausea and vomiting, unspecified vomiting type  Influenza-like illness     Discharge Instructions      Your influenza and COVID are negative. You most likely have a viral respiratory infection that will gradually improve over the next 7-10 days. Cough may last up to 3 weeks. If your were prescribed medication, stop by the pharmacy to pick them up.  You can take Tylenol  and/or Ibuprofen  as needed for fever reduction and pain relief.     For sore throat: try warm salt water  gargles, Mucinex sore throat cough drops or cepacol lozenges, throat spray, warm tea or water  with lemon/honey, popsicles or ice, or OTC cold relief medicine for throat discomfort. You can also purchase chloraseptic spray at the pharmacy or dollar store.  For congestion: take a daily anti-histamine like Zyrtec, Claritin, and a oral decongestant.  Pick up the prescription nasal spray from your pharmacy.    Vomiting:  Pick up the anti-nausea medication. Eat a bland diet. It is important to stay hydrated: drink plenty of fluids (water , gatorade/powerade/pedialyte, juices, or teas) to keep your throat moisturized and help further relieve irritation/discomfort.    Return or go to the Emergency Department if symptoms worsen or do not improve in  the next few days      ED Prescriptions     Medication Sig Dispense Auth. Provider   ipratropium (ATROVENT ) 0.06 % nasal spray Place 2 sprays into both nostrils 4 (four) times daily. 15 mL Eriq Hufford, DO   ondansetron  (ZOFRAN -ODT) 4 MG disintegrating tablet Take 1 tablet (4 mg total) by mouth every 8 (eight) hours as needed for nausea or vomiting. 20 tablet Valisha Heslin, DO      PDMP not reviewed this encounter.     [1]  Social History Tobacco Use   Smoking status: Former    Current packs/day: 0.00    Average packs/day: 1 pack/day for 15.0 years (15.0 ttl pk-yrs)    Types: Cigarettes    Start date: 02/22/2000    Quit date: 02/21/2015    Years since quitting: 9.9   Smokeless tobacco: Never  Vaping Use   Vaping status: Former   Quit date: 01/01/2019  Substance Use Topics   Alcohol use: Yes    Alcohol/week: 2.0 standard drinks of alcohol    Types: 2 Cans of beer per week    Comment: Occasional    Drug use: No     Kriste Berth, DO 01/24/25 1104  "

## 2025-01-24 NOTE — Discharge Instructions (Addendum)
 Your influenza and COVID are negative. You most likely have a viral respiratory infection that will gradually improve over the next 7-10 days. Cough may last up to 3 weeks. If your were prescribed medication, stop by the pharmacy to pick them up.  You can take Tylenol  and/or Ibuprofen  as needed for fever reduction and pain relief.     For sore throat: try warm salt water  gargles, Mucinex sore throat cough drops or cepacol lozenges, throat spray, warm tea or water  with lemon/honey, popsicles or ice, or OTC cold relief medicine for throat discomfort. You can also purchase chloraseptic spray at the pharmacy or dollar store.     For congestion: take a daily anti-histamine like Zyrtec, Claritin, and a oral decongestant.  Pick up the prescription nasal spray from your pharmacy.    Vomiting:  Pick up the anti-nausea medication. Eat a bland diet. It is important to stay hydrated: drink plenty of fluids (water , gatorade/powerade/pedialyte, juices, or teas) to keep your throat moisturized and help further relieve irritation/discomfort.    Return or go to the Emergency Department if symptoms worsen or do not improve in the next few days

## 2025-01-24 NOTE — ED Triage Notes (Signed)
 Patient to Urgent Care with complaints of  body aches/ vomiting/ diarrhea. Fever last night of 102.  Symptoms started Monday night.   Using alka-selzter cold and flu but unable to keep this down.

## 2025-11-30 ENCOUNTER — Encounter: Admitting: Nurse Practitioner
